# Patient Record
Sex: Female | Born: 1980
Health system: Southern US, Community
[De-identification: ages and names within clinical notes are randomized; demographics above are authoritative.]

## PROBLEM LIST (undated history)

## (undated) DIAGNOSIS — O24419 Gestational diabetes mellitus in pregnancy, unspecified control: Secondary | ICD-10-CM

## (undated) DIAGNOSIS — IMO0002 Reserved for concepts with insufficient information to code with codable children: Secondary | ICD-10-CM

## (undated) DIAGNOSIS — E282 Polycystic ovarian syndrome: Secondary | ICD-10-CM

## (undated) DIAGNOSIS — F32A Depression, unspecified: Secondary | ICD-10-CM

## (undated) DIAGNOSIS — K219 Gastro-esophageal reflux disease without esophagitis: Secondary | ICD-10-CM

## (undated) DIAGNOSIS — IMO0001 Reserved for inherently not codable concepts without codable children: Secondary | ICD-10-CM

## (undated) DIAGNOSIS — F419 Anxiety disorder, unspecified: Secondary | ICD-10-CM

## (undated) DIAGNOSIS — F329 Major depressive disorder, single episode, unspecified: Secondary | ICD-10-CM

## (undated) DIAGNOSIS — D649 Anemia, unspecified: Secondary | ICD-10-CM

## (undated) DIAGNOSIS — E119 Type 2 diabetes mellitus without complications: Secondary | ICD-10-CM

## (undated) DIAGNOSIS — E669 Obesity, unspecified: Secondary | ICD-10-CM

## (undated) HISTORY — PX: TUBAL LIGATION: SHX77

## (undated) HISTORY — DX: Gastro-esophageal reflux disease without esophagitis: K21.9

## (undated) HISTORY — DX: Anxiety disorder, unspecified: F41.9

## (undated) HISTORY — DX: Anemia, unspecified: D64.9

## (undated) HISTORY — DX: Reserved for concepts with insufficient information to code with codable children: IMO0002

## (undated) HISTORY — PX: UPPER GASTROINTESTINAL ENDOSCOPY: SHX188

## (undated) HISTORY — DX: Type 2 diabetes mellitus without complications: E11.9

## (undated) HISTORY — DX: Reserved for inherently not codable concepts without codable children: IMO0001

---

## 1999-04-01 ENCOUNTER — Emergency Department (HOSPITAL_COMMUNITY): Admission: EM | Admit: 1999-04-01 | Discharge: 1999-04-01 | Payer: Self-pay | Admitting: Emergency Medicine

## 1999-11-21 ENCOUNTER — Ambulatory Visit (HOSPITAL_COMMUNITY): Admission: RE | Admit: 1999-11-21 | Discharge: 1999-11-21 | Payer: Self-pay | Admitting: *Deleted

## 2000-01-08 ENCOUNTER — Inpatient Hospital Stay (HOSPITAL_COMMUNITY): Admission: AD | Admit: 2000-01-08 | Discharge: 2000-01-08 | Payer: Self-pay | Admitting: Obstetrics & Gynecology

## 2000-01-24 ENCOUNTER — Inpatient Hospital Stay (HOSPITAL_COMMUNITY): Admission: AD | Admit: 2000-01-24 | Discharge: 2000-01-24 | Payer: Self-pay | Admitting: Obstetrics

## 2000-01-26 ENCOUNTER — Inpatient Hospital Stay (HOSPITAL_COMMUNITY): Admission: AD | Admit: 2000-01-26 | Discharge: 2000-01-26 | Payer: Self-pay | Admitting: Obstetrics

## 2000-01-28 ENCOUNTER — Inpatient Hospital Stay (HOSPITAL_COMMUNITY): Admission: AD | Admit: 2000-01-28 | Discharge: 2000-01-28 | Payer: Self-pay | Admitting: *Deleted

## 2000-04-20 ENCOUNTER — Inpatient Hospital Stay (HOSPITAL_COMMUNITY): Admission: AD | Admit: 2000-04-20 | Discharge: 2000-04-20 | Payer: Self-pay | Admitting: Obstetrics

## 2000-04-26 ENCOUNTER — Inpatient Hospital Stay (HOSPITAL_COMMUNITY): Admission: AD | Admit: 2000-04-26 | Discharge: 2000-04-26 | Payer: Self-pay | Admitting: Obstetrics

## 2000-05-04 ENCOUNTER — Inpatient Hospital Stay (HOSPITAL_COMMUNITY): Admission: AD | Admit: 2000-05-04 | Discharge: 2000-05-04 | Payer: Self-pay | Admitting: Obstetrics

## 2000-05-06 ENCOUNTER — Inpatient Hospital Stay (HOSPITAL_COMMUNITY): Admission: AD | Admit: 2000-05-06 | Discharge: 2000-05-09 | Payer: Self-pay | Admitting: *Deleted

## 2000-11-29 ENCOUNTER — Encounter: Admission: RE | Admit: 2000-11-29 | Discharge: 2000-11-29 | Payer: Self-pay | Admitting: Sports Medicine

## 2000-12-06 ENCOUNTER — Encounter: Admission: RE | Admit: 2000-12-06 | Discharge: 2000-12-06 | Payer: Self-pay | Admitting: Family Medicine

## 2001-02-17 ENCOUNTER — Emergency Department (HOSPITAL_COMMUNITY): Admission: EM | Admit: 2001-02-17 | Discharge: 2001-02-17 | Payer: Self-pay | Admitting: Emergency Medicine

## 2001-03-04 ENCOUNTER — Encounter: Admission: RE | Admit: 2001-03-04 | Discharge: 2001-03-04 | Payer: Self-pay | Admitting: Family Medicine

## 2001-04-28 ENCOUNTER — Emergency Department (HOSPITAL_COMMUNITY): Admission: EM | Admit: 2001-04-28 | Discharge: 2001-04-29 | Payer: Self-pay | Admitting: Emergency Medicine

## 2001-05-26 ENCOUNTER — Encounter: Admission: RE | Admit: 2001-05-26 | Discharge: 2001-05-26 | Payer: Self-pay | Admitting: Family Medicine

## 2001-08-04 ENCOUNTER — Encounter: Payer: Self-pay | Admitting: Emergency Medicine

## 2001-08-04 ENCOUNTER — Emergency Department (HOSPITAL_COMMUNITY): Admission: EM | Admit: 2001-08-04 | Discharge: 2001-08-04 | Payer: Self-pay | Admitting: Emergency Medicine

## 2001-08-25 ENCOUNTER — Encounter: Admission: RE | Admit: 2001-08-25 | Discharge: 2001-08-25 | Payer: Self-pay | Admitting: Family Medicine

## 2001-08-29 ENCOUNTER — Encounter: Admission: RE | Admit: 2001-08-29 | Discharge: 2001-08-29 | Payer: Self-pay | Admitting: Family Medicine

## 2001-10-04 ENCOUNTER — Encounter: Admission: RE | Admit: 2001-10-04 | Discharge: 2001-10-04 | Payer: Self-pay | Admitting: Family Medicine

## 2001-11-23 ENCOUNTER — Encounter: Admission: RE | Admit: 2001-11-23 | Discharge: 2001-11-23 | Payer: Self-pay | Admitting: Family Medicine

## 2001-12-07 ENCOUNTER — Encounter: Admission: RE | Admit: 2001-12-07 | Discharge: 2001-12-07 | Payer: Self-pay | Admitting: Family Medicine

## 2002-02-24 ENCOUNTER — Encounter: Admission: RE | Admit: 2002-02-24 | Discharge: 2002-02-24 | Payer: Self-pay | Admitting: Family Medicine

## 2002-03-23 ENCOUNTER — Encounter: Admission: RE | Admit: 2002-03-23 | Discharge: 2002-03-23 | Payer: Self-pay | Admitting: Family Medicine

## 2002-04-05 ENCOUNTER — Encounter: Admission: RE | Admit: 2002-04-05 | Discharge: 2002-04-05 | Payer: Self-pay | Admitting: Family Medicine

## 2002-04-26 ENCOUNTER — Encounter: Admission: RE | Admit: 2002-04-26 | Discharge: 2002-04-26 | Payer: Self-pay | Admitting: Sports Medicine

## 2002-04-27 ENCOUNTER — Encounter: Admission: RE | Admit: 2002-04-27 | Discharge: 2002-04-27 | Payer: Self-pay | Admitting: Family Medicine

## 2002-05-03 ENCOUNTER — Encounter: Admission: RE | Admit: 2002-05-03 | Discharge: 2002-05-03 | Payer: Self-pay | Admitting: Family Medicine

## 2002-08-02 ENCOUNTER — Encounter: Admission: RE | Admit: 2002-08-02 | Discharge: 2002-08-02 | Payer: Self-pay | Admitting: Family Medicine

## 2002-09-22 ENCOUNTER — Emergency Department (HOSPITAL_COMMUNITY): Admission: EM | Admit: 2002-09-22 | Discharge: 2002-09-22 | Payer: Self-pay | Admitting: Emergency Medicine

## 2002-10-26 ENCOUNTER — Encounter: Admission: RE | Admit: 2002-10-26 | Discharge: 2002-10-26 | Payer: Self-pay | Admitting: Sports Medicine

## 2003-01-09 ENCOUNTER — Encounter: Admission: RE | Admit: 2003-01-09 | Discharge: 2003-01-09 | Payer: Self-pay | Admitting: Sports Medicine

## 2003-06-19 ENCOUNTER — Encounter: Admission: RE | Admit: 2003-06-19 | Discharge: 2003-06-19 | Payer: Self-pay | Admitting: Family Medicine

## 2003-07-18 ENCOUNTER — Emergency Department (HOSPITAL_COMMUNITY): Admission: EM | Admit: 2003-07-18 | Discharge: 2003-07-19 | Payer: Self-pay | Admitting: Emergency Medicine

## 2003-07-19 ENCOUNTER — Encounter: Payer: Self-pay | Admitting: Emergency Medicine

## 2003-07-19 ENCOUNTER — Encounter: Payer: Self-pay | Admitting: *Deleted

## 2003-11-01 ENCOUNTER — Encounter: Admission: RE | Admit: 2003-11-01 | Discharge: 2003-11-01 | Payer: Self-pay | Admitting: Family Medicine

## 2003-11-12 ENCOUNTER — Encounter: Admission: RE | Admit: 2003-11-12 | Discharge: 2003-11-12 | Payer: Self-pay | Admitting: Family Medicine

## 2003-12-31 ENCOUNTER — Encounter: Admission: RE | Admit: 2003-12-31 | Discharge: 2003-12-31 | Payer: Self-pay | Admitting: Family Medicine

## 2004-02-14 ENCOUNTER — Encounter: Admission: RE | Admit: 2004-02-14 | Discharge: 2004-02-14 | Payer: Self-pay | Admitting: Family Medicine

## 2004-03-13 ENCOUNTER — Encounter: Admission: RE | Admit: 2004-03-13 | Discharge: 2004-03-13 | Payer: Self-pay | Admitting: Family Medicine

## 2004-04-02 ENCOUNTER — Encounter: Admission: RE | Admit: 2004-04-02 | Discharge: 2004-04-02 | Payer: Self-pay | Admitting: Family Medicine

## 2004-04-16 ENCOUNTER — Emergency Department (HOSPITAL_COMMUNITY): Admission: EM | Admit: 2004-04-16 | Discharge: 2004-04-16 | Payer: Self-pay | Admitting: Emergency Medicine

## 2004-05-01 ENCOUNTER — Encounter: Admission: RE | Admit: 2004-05-01 | Discharge: 2004-05-01 | Payer: Self-pay | Admitting: Family Medicine

## 2004-05-19 ENCOUNTER — Encounter (INDEPENDENT_AMBULATORY_CARE_PROVIDER_SITE_OTHER): Payer: Self-pay | Admitting: *Deleted

## 2004-05-19 LAB — CONVERTED CEMR LAB

## 2004-05-23 ENCOUNTER — Encounter: Admission: RE | Admit: 2004-05-23 | Discharge: 2004-05-23 | Payer: Self-pay | Admitting: Family Medicine

## 2004-07-22 ENCOUNTER — Ambulatory Visit: Payer: Self-pay | Admitting: Family Medicine

## 2004-07-25 ENCOUNTER — Ambulatory Visit: Payer: Self-pay | Admitting: Sports Medicine

## 2004-08-08 ENCOUNTER — Ambulatory Visit: Payer: Self-pay | Admitting: Family Medicine

## 2004-11-07 ENCOUNTER — Ambulatory Visit: Payer: Self-pay | Admitting: Family Medicine

## 2004-11-21 ENCOUNTER — Ambulatory Visit: Payer: Self-pay | Admitting: Family Medicine

## 2005-03-09 ENCOUNTER — Ambulatory Visit: Payer: Self-pay | Admitting: Family Medicine

## 2005-04-17 ENCOUNTER — Ambulatory Visit: Payer: Self-pay | Admitting: Sports Medicine

## 2005-06-01 ENCOUNTER — Ambulatory Visit: Payer: Self-pay | Admitting: Family Medicine

## 2005-06-14 ENCOUNTER — Emergency Department (HOSPITAL_COMMUNITY): Admission: EM | Admit: 2005-06-14 | Discharge: 2005-06-14 | Payer: Self-pay | Admitting: Family Medicine

## 2005-06-18 ENCOUNTER — Ambulatory Visit: Payer: Self-pay | Admitting: Family Medicine

## 2005-08-10 ENCOUNTER — Ambulatory Visit: Payer: Self-pay | Admitting: Family Medicine

## 2005-12-05 ENCOUNTER — Emergency Department (HOSPITAL_COMMUNITY): Admission: EM | Admit: 2005-12-05 | Discharge: 2005-12-05 | Payer: Self-pay | Admitting: Family Medicine

## 2006-01-14 ENCOUNTER — Ambulatory Visit: Payer: Self-pay | Admitting: Family Medicine

## 2006-10-09 ENCOUNTER — Emergency Department (HOSPITAL_COMMUNITY): Admission: EM | Admit: 2006-10-09 | Discharge: 2006-10-09 | Payer: Self-pay | Admitting: Emergency Medicine

## 2006-10-30 ENCOUNTER — Emergency Department (HOSPITAL_COMMUNITY): Admission: EM | Admit: 2006-10-30 | Discharge: 2006-10-30 | Payer: Self-pay | Admitting: Emergency Medicine

## 2006-12-03 ENCOUNTER — Emergency Department (HOSPITAL_COMMUNITY): Admission: EM | Admit: 2006-12-03 | Discharge: 2006-12-03 | Payer: Self-pay | Admitting: Emergency Medicine

## 2006-12-13 ENCOUNTER — Inpatient Hospital Stay: Admission: AD | Admit: 2006-12-13 | Discharge: 2006-12-13 | Payer: Self-pay | Admitting: Gynecology

## 2006-12-16 DIAGNOSIS — F339 Major depressive disorder, recurrent, unspecified: Secondary | ICD-10-CM | POA: Insufficient documentation

## 2006-12-16 DIAGNOSIS — F17200 Nicotine dependence, unspecified, uncomplicated: Secondary | ICD-10-CM | POA: Insufficient documentation

## 2006-12-16 DIAGNOSIS — E282 Polycystic ovarian syndrome: Secondary | ICD-10-CM

## 2006-12-16 DIAGNOSIS — E669 Obesity, unspecified: Secondary | ICD-10-CM | POA: Insufficient documentation

## 2006-12-17 ENCOUNTER — Encounter (INDEPENDENT_AMBULATORY_CARE_PROVIDER_SITE_OTHER): Payer: Self-pay | Admitting: *Deleted

## 2007-01-24 ENCOUNTER — Ambulatory Visit (HOSPITAL_COMMUNITY): Admission: RE | Admit: 2007-01-24 | Discharge: 2007-01-24 | Payer: Self-pay | Admitting: Obstetrics & Gynecology

## 2007-02-12 ENCOUNTER — Inpatient Hospital Stay (HOSPITAL_COMMUNITY): Admission: AD | Admit: 2007-02-12 | Discharge: 2007-02-12 | Payer: Self-pay | Admitting: Family Medicine

## 2007-03-20 ENCOUNTER — Emergency Department (HOSPITAL_COMMUNITY): Admission: EM | Admit: 2007-03-20 | Discharge: 2007-03-21 | Payer: Self-pay | Admitting: Emergency Medicine

## 2007-04-09 ENCOUNTER — Inpatient Hospital Stay (HOSPITAL_COMMUNITY): Admission: AD | Admit: 2007-04-09 | Discharge: 2007-04-09 | Payer: Self-pay | Admitting: Obstetrics & Gynecology

## 2007-05-04 ENCOUNTER — Ambulatory Visit (HOSPITAL_COMMUNITY): Admission: RE | Admit: 2007-05-04 | Discharge: 2007-05-04 | Payer: Self-pay | Admitting: Obstetrics & Gynecology

## 2007-05-08 ENCOUNTER — Ambulatory Visit: Payer: Self-pay | Admitting: Obstetrics & Gynecology

## 2007-05-08 ENCOUNTER — Observation Stay (HOSPITAL_COMMUNITY): Admission: AD | Admit: 2007-05-08 | Discharge: 2007-05-09 | Payer: Self-pay | Admitting: Obstetrics & Gynecology

## 2007-06-16 ENCOUNTER — Inpatient Hospital Stay (HOSPITAL_COMMUNITY): Admission: RE | Admit: 2007-06-16 | Discharge: 2007-06-18 | Payer: Self-pay | Admitting: Obstetrics and Gynecology

## 2007-06-16 ENCOUNTER — Ambulatory Visit: Payer: Self-pay | Admitting: Obstetrics and Gynecology

## 2007-06-16 ENCOUNTER — Encounter: Payer: Self-pay | Admitting: Obstetrics and Gynecology

## 2007-07-11 ENCOUNTER — Emergency Department (HOSPITAL_COMMUNITY): Admission: EM | Admit: 2007-07-11 | Discharge: 2007-07-11 | Payer: Self-pay | Admitting: Family Medicine

## 2007-11-01 ENCOUNTER — Emergency Department (HOSPITAL_COMMUNITY): Admission: EM | Admit: 2007-11-01 | Discharge: 2007-11-01 | Payer: Self-pay | Admitting: Family Medicine

## 2008-01-14 ENCOUNTER — Emergency Department (HOSPITAL_COMMUNITY): Admission: EM | Admit: 2008-01-14 | Discharge: 2008-01-14 | Payer: Self-pay | Admitting: Emergency Medicine

## 2008-07-27 ENCOUNTER — Emergency Department (HOSPITAL_COMMUNITY): Admission: EM | Admit: 2008-07-27 | Discharge: 2008-07-27 | Payer: Self-pay | Admitting: Emergency Medicine

## 2008-10-05 ENCOUNTER — Emergency Department (HOSPITAL_COMMUNITY): Admission: EM | Admit: 2008-10-05 | Discharge: 2008-10-05 | Payer: Self-pay | Admitting: Emergency Medicine

## 2008-11-07 ENCOUNTER — Emergency Department (HOSPITAL_COMMUNITY): Admission: EM | Admit: 2008-11-07 | Discharge: 2008-11-07 | Payer: Self-pay | Admitting: Emergency Medicine

## 2008-12-03 ENCOUNTER — Emergency Department (HOSPITAL_COMMUNITY): Admission: EM | Admit: 2008-12-03 | Discharge: 2008-12-04 | Payer: Self-pay | Admitting: Emergency Medicine

## 2008-12-23 ENCOUNTER — Emergency Department (HOSPITAL_COMMUNITY): Admission: EM | Admit: 2008-12-23 | Discharge: 2008-12-23 | Payer: Self-pay | Admitting: Emergency Medicine

## 2008-12-24 ENCOUNTER — Emergency Department (HOSPITAL_COMMUNITY): Admission: EM | Admit: 2008-12-24 | Discharge: 2008-12-25 | Payer: Self-pay | Admitting: Emergency Medicine

## 2009-02-28 ENCOUNTER — Emergency Department (HOSPITAL_COMMUNITY): Admission: EM | Admit: 2009-02-28 | Discharge: 2009-02-28 | Payer: Self-pay | Admitting: Family Medicine

## 2009-04-01 ENCOUNTER — Emergency Department (HOSPITAL_COMMUNITY): Admission: EM | Admit: 2009-04-01 | Discharge: 2009-04-01 | Payer: Self-pay | Admitting: Family Medicine

## 2009-04-03 ENCOUNTER — Emergency Department (HOSPITAL_COMMUNITY): Admission: EM | Admit: 2009-04-03 | Discharge: 2009-04-03 | Payer: Self-pay | Admitting: Emergency Medicine

## 2009-04-26 ENCOUNTER — Inpatient Hospital Stay (HOSPITAL_COMMUNITY): Admission: AD | Admit: 2009-04-26 | Discharge: 2009-04-27 | Payer: Self-pay | Admitting: Obstetrics & Gynecology

## 2009-06-20 IMAGING — CT CT PELVIS W/ CM
2 of 5 series · 17 of 46 positions shown, 19 images · IV contrast (agent unspecified)
Comparison: None available

CT ABDOMEN

CLINICAL DATA: Left lower quadrant pain.  Abdominal pain.
Periumbilical pain.

CT ABDOMEN AND PELVIS WITH CONTRAST
TECHNIQUE: Multidetector CT imaging of the abdomen and pelvis was
performed using the standard protocol following bolus
administration of intravenous contrast.
Contrast: 100 ml Zmnipaque-SDD.

[Series 2: abd_pel 5.0 b40f st · axial · 0.71mm/px · z∈[-380,+20]mm · 14 of 90 slices shown, 16 images]
[im 5/90  soft-tissue]
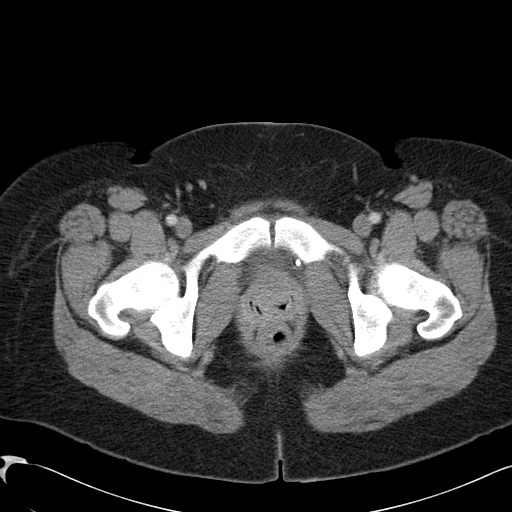
[im 5/90  bone]
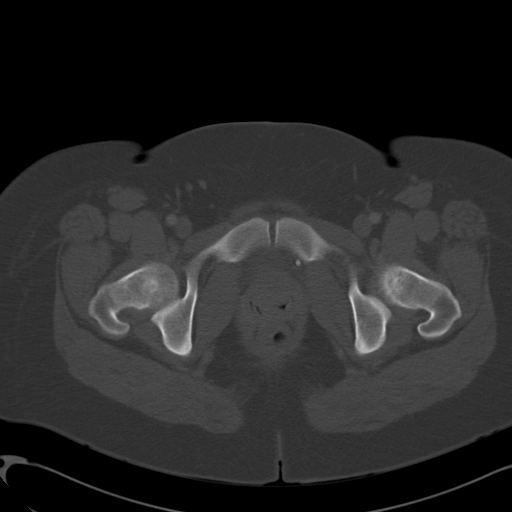
[im 10/90  soft-tissue]
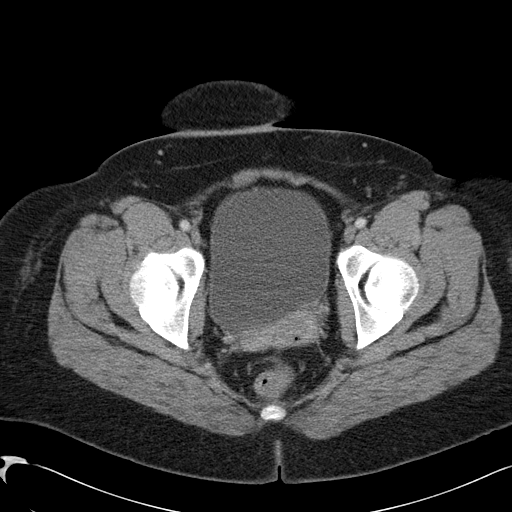
[im 19/90  soft-tissue]
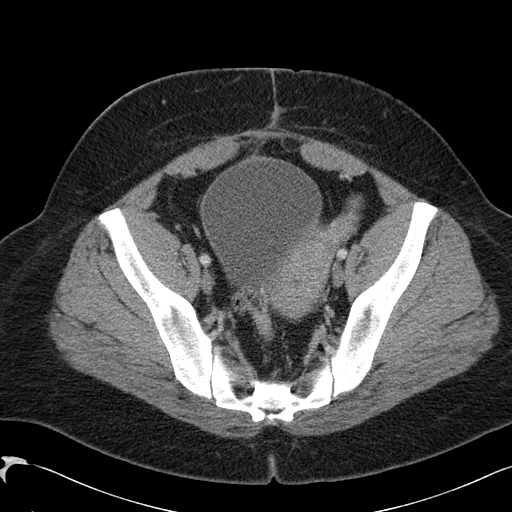
[im 24/90  soft-tissue]
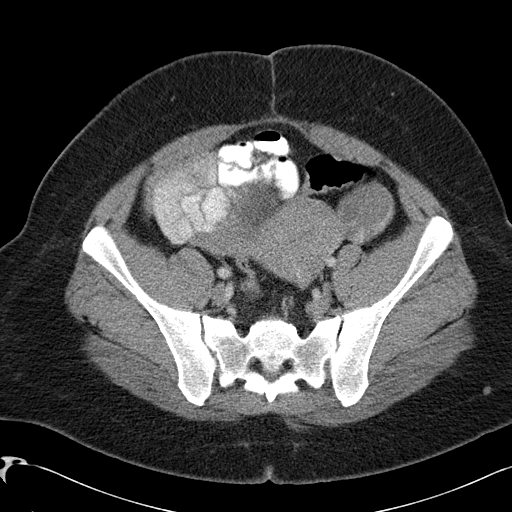
[im 29/90  soft-tissue]
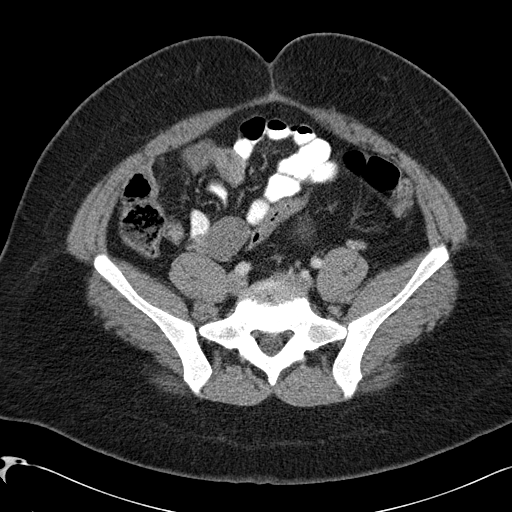
[im 38/90  soft-tissue]
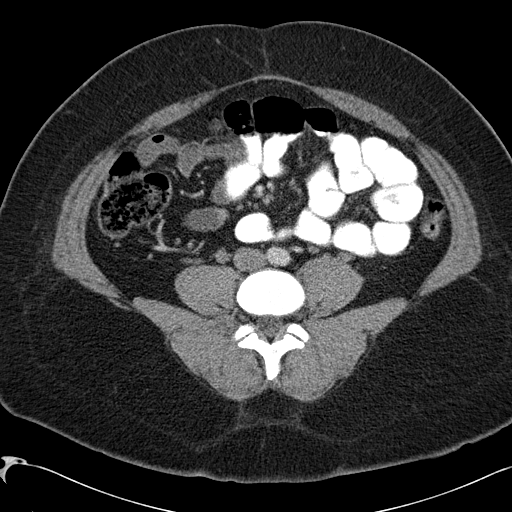
[im 43/90  soft-tissue]
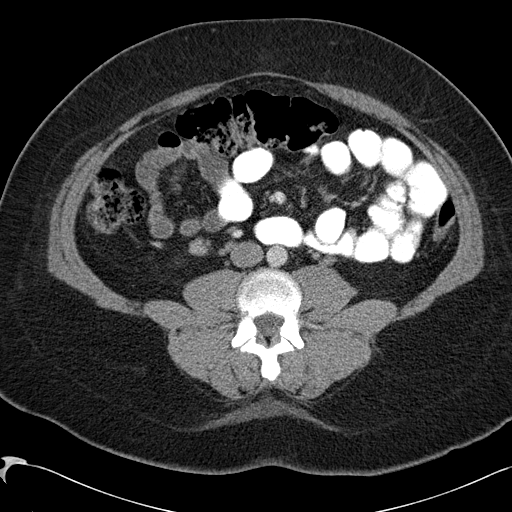
[im 47/90  soft-tissue]
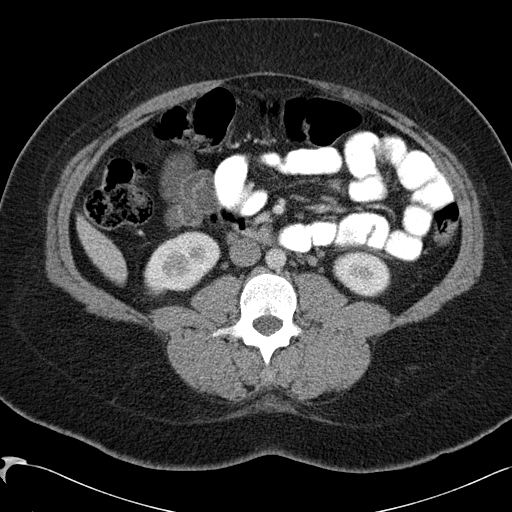
[im 52/90  soft-tissue]
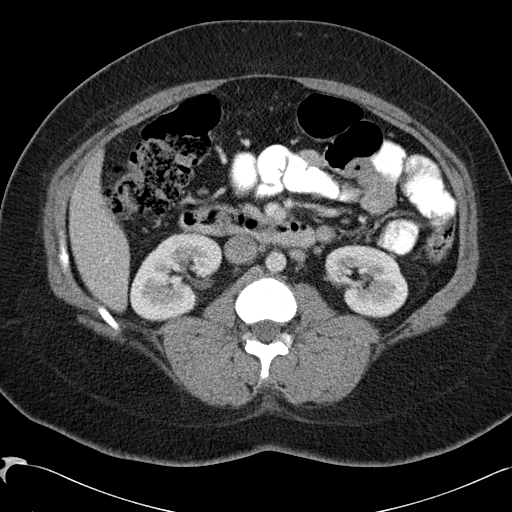
[im 52/90  bone]
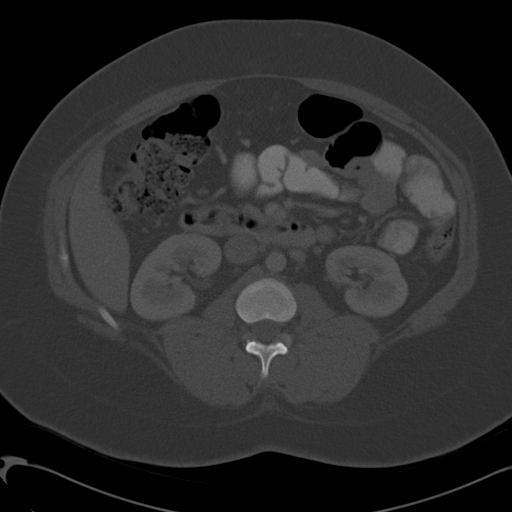
[im 61/90  soft-tissue]
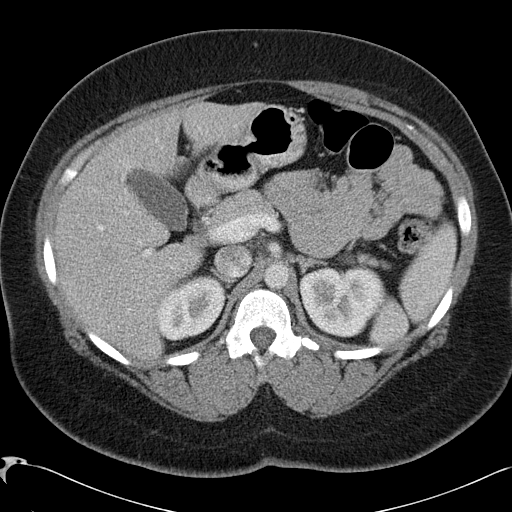
[im 66/90  soft-tissue]
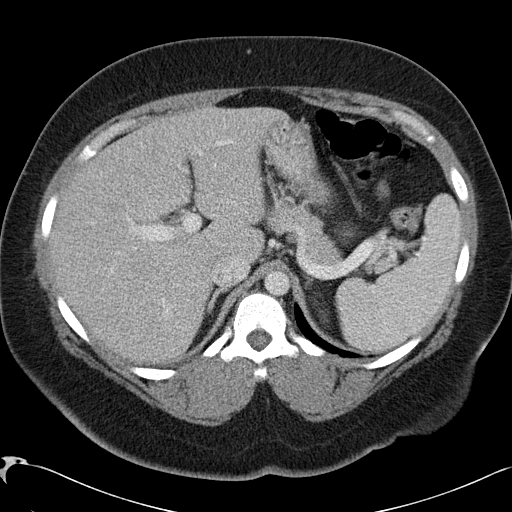
[im 71/90  soft-tissue]
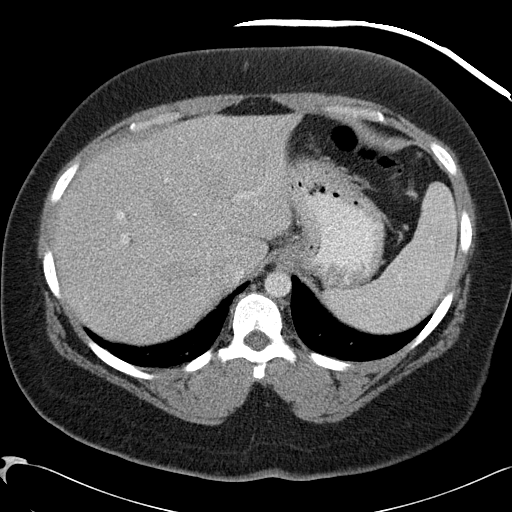
[im 80/90  soft-tissue]
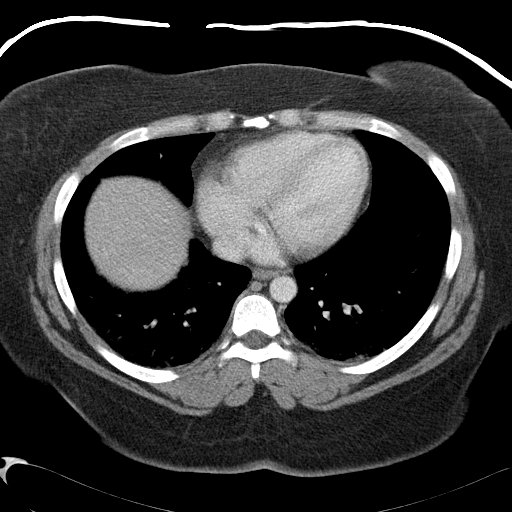
[im 85/90  soft-tissue]
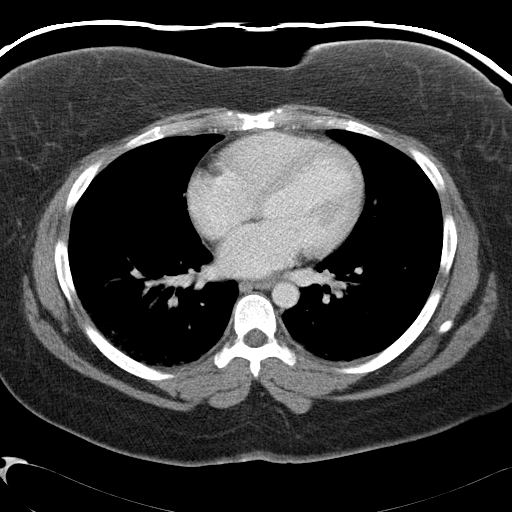

[Series 602: <mpr thick range> · coronal · 0.91mm/px · 3 of 82 slices shown]
[im 28/82  soft-tissue]
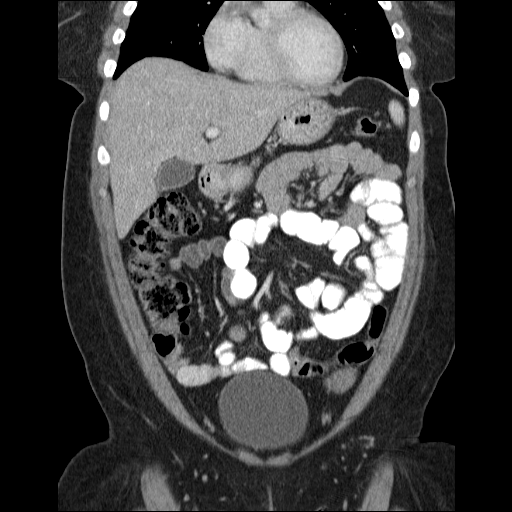
[im 37/82  soft-tissue]
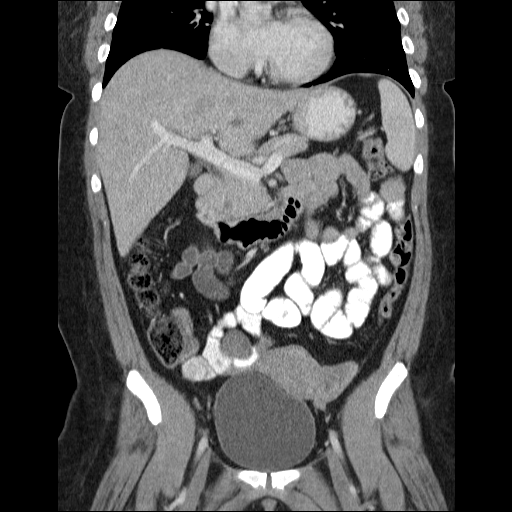
[im 46/82  soft-tissue]
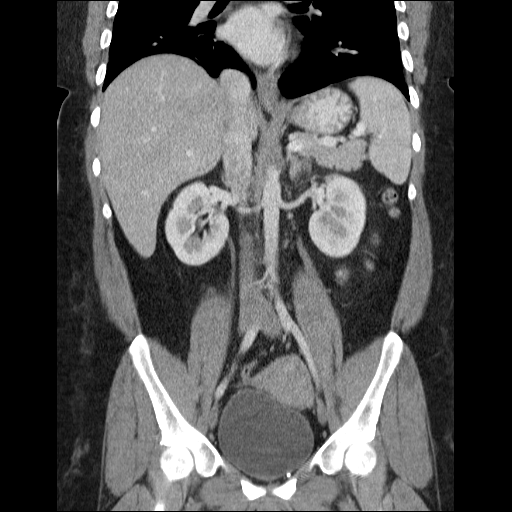

[17 of 46 positions shown; findings below may reference images not displayed]

FINDINGS: Dependent atelectasis is present in the lungs.  Liver
and gallbladder appear within normal limits.  Kidneys show normal
enhancement and excretion of contrast bilaterally.  Spleen
unremarkable.  Stomach, pancreas and proximal small bowel appear
within normal limits.  No free air or free fluid.
IMPRESSION: No acute abdominal abnormality.

CT PELVIS
FINDINGS: No free fluid is present in the anatomic pelvis.
Urinary bladder within normal limits.  Intermediate attenuation
left ovarian cystic lesion measuring 3.7 x 3.2 cm.  This probably
represent hemorrhagic ovarian cyst.  Endometrioma not excluded.
Smaller 3.3 x 2.6 cm right ovarian cystic lesion.  Midline
abdominal scar is present.  Colon appears within normal limits.
Normal appendix identified.  Bones appear within normal limits.
IMPRESSION: No acute pelvic abnormality.  Bilateral ovarian cystic lesions.
Follow-up 6-week transvaginal ultrasound recommended to assess for
interval change or resolution.

## 2009-06-28 ENCOUNTER — Emergency Department (HOSPITAL_COMMUNITY): Admission: EM | Admit: 2009-06-28 | Discharge: 2009-06-28 | Payer: Self-pay | Admitting: Emergency Medicine

## 2009-09-30 ENCOUNTER — Emergency Department (HOSPITAL_COMMUNITY): Admission: EM | Admit: 2009-09-30 | Discharge: 2009-09-30 | Payer: Self-pay | Admitting: Emergency Medicine

## 2009-10-15 ENCOUNTER — Emergency Department (HOSPITAL_COMMUNITY): Admission: EM | Admit: 2009-10-15 | Discharge: 2009-10-15 | Payer: Self-pay | Admitting: Emergency Medicine

## 2009-11-04 ENCOUNTER — Inpatient Hospital Stay (HOSPITAL_COMMUNITY): Admission: AD | Admit: 2009-11-04 | Discharge: 2009-11-05 | Payer: Self-pay | Admitting: Obstetrics & Gynecology

## 2009-12-10 ENCOUNTER — Emergency Department (HOSPITAL_COMMUNITY): Admission: EM | Admit: 2009-12-10 | Discharge: 2009-12-11 | Payer: Self-pay | Admitting: Emergency Medicine

## 2010-03-18 ENCOUNTER — Emergency Department (HOSPITAL_COMMUNITY): Admission: EM | Admit: 2010-03-18 | Discharge: 2010-03-18 | Payer: Self-pay | Admitting: Family Medicine

## 2010-03-20 ENCOUNTER — Emergency Department (HOSPITAL_COMMUNITY): Admission: EM | Admit: 2010-03-20 | Discharge: 2010-03-20 | Payer: Self-pay | Admitting: Family Medicine

## 2010-05-14 ENCOUNTER — Emergency Department (HOSPITAL_COMMUNITY): Admission: EM | Admit: 2010-05-14 | Discharge: 2010-05-14 | Payer: Self-pay | Admitting: Emergency Medicine

## 2010-07-13 ENCOUNTER — Emergency Department (HOSPITAL_COMMUNITY): Admission: EM | Admit: 2010-07-13 | Discharge: 2010-07-13 | Payer: Self-pay | Admitting: Emergency Medicine

## 2010-10-07 ENCOUNTER — Emergency Department (HOSPITAL_COMMUNITY)
Admission: EM | Admit: 2010-10-07 | Discharge: 2010-10-07 | Payer: Self-pay | Source: Home / Self Care | Admitting: Family Medicine

## 2010-10-12 ENCOUNTER — Inpatient Hospital Stay (HOSPITAL_COMMUNITY)
Admission: AD | Admit: 2010-10-12 | Discharge: 2010-10-13 | Payer: Self-pay | Source: Home / Self Care | Attending: Obstetrics and Gynecology | Admitting: Obstetrics and Gynecology

## 2010-10-19 NOTE — L&D Delivery Note (Signed)
Cesarean Section Procedure Note   [name]   06/01/2011  Indications: Scheduled Proceedure/Maternal Request   Pre-operative Diagnosis: Previous Cesarean Section; Desires Sterilization.   Post-operative Diagnosis: Same   Surgeon: Coral Ceo A  Assistants: Francoise Ceo  Anesthesia: spinal  Procedure Details:  The patient was seen in the Holding Room. The risks, benefits, complications, treatment options, and expected outcomes were discussed with the patient. The patient concurred with the proposed plan, giving informed consent. The patient was identified as Stacie Carrillo and the procedure verified as C-Section Delivery. A Time Out was held and the above information confirmed.  After induction of anesthesia, the patient was draped and prepped in the usual sterile manner. A transverse incision was made and carried down through the subcutaneous tissue to the fascia. The fascial incision was made and extended transversely. The fascia was separated from the underlying rectus tissue superiorly and inferiorly. The peritoneum was identified and entered. The peritoneal incision was extended longitudinally. The utero-vesical peritoneal reflection was incised transversely and the bladder flap was bluntly freed from the lower uterine segment. A low transverse uterine incision was made. Delivered from cephalic presentation was a 3265 gram living newborn maleinfant(s) with Apgar scores of 8 at one minute and 9 at five minutes. A cord ph was not sent. The umbilical cord was clamped and cut cord. A sample was obtained for evaluation. The placenta was removed Intact and appeared normal.  The uterine incision was closed with running locked sutures of 1-0 Monocryl. A second imbricating layer of the same suture was placed.  Hemostasis was observed. The paracolic gutters were irrigated. The fascia was then reapproximated with running sutures of 1-0Vicryl. The subcuticular closure was performed using  3-0 Monocryl.  Instrument, sponge, and needle counts were correct prior the abdominal closure and were correct at the conclusion of the case.    Findings:  Normal uterus, ovaries and tubes.   Estimated Blood Loss:  Total IV Fluids:   Urine Output: 125CC OF clear urine  Specimens:  Placenta, portion of left and right fallopian tubes. Specimens           Complications: no complications  Disposition: PACU - hemodynamically stable.  Maternal Condition: stable   Baby condition / location:  nursery-stable    Signed: Surgeon(s): Brock Bad, MD Kathreen Cosier, MD

## 2010-10-26 ENCOUNTER — Inpatient Hospital Stay (HOSPITAL_COMMUNITY)
Admission: AD | Admit: 2010-10-26 | Discharge: 2010-10-26 | Payer: Self-pay | Source: Home / Self Care | Attending: Obstetrics and Gynecology | Admitting: Obstetrics and Gynecology

## 2010-11-03 ENCOUNTER — Inpatient Hospital Stay (HOSPITAL_COMMUNITY)
Admission: AD | Admit: 2010-11-03 | Discharge: 2010-11-03 | Payer: Self-pay | Source: Home / Self Care | Attending: Obstetrics and Gynecology | Admitting: Obstetrics and Gynecology

## 2010-11-03 LAB — GC/CHLAMYDIA PROBE AMP, GENITAL
Chlamydia, DNA Probe: NEGATIVE
GC Probe Amp, Genital: NEGATIVE

## 2010-11-03 LAB — URINALYSIS, ROUTINE W REFLEX MICROSCOPIC
Bilirubin Urine: NEGATIVE
Hgb urine dipstick: NEGATIVE
Ketones, ur: NEGATIVE mg/dL
Nitrite: NEGATIVE
Protein, ur: NEGATIVE mg/dL
Specific Gravity, Urine: 1.02 (ref 1.005–1.030)
Urine Glucose, Fasting: NEGATIVE mg/dL
Urobilinogen, UA: 0.2 mg/dL (ref 0.0–1.0)
pH: 8 (ref 5.0–8.0)

## 2010-11-03 LAB — CBC
HCT: 37.8 % (ref 36.0–46.0)
Hemoglobin: 13.8 g/dL (ref 12.0–15.0)
MCH: 31.8 pg (ref 26.0–34.0)
MCHC: 36.5 g/dL — ABNORMAL HIGH (ref 30.0–36.0)
MCV: 87.1 fL (ref 78.0–100.0)
Platelets: 224 10*3/uL (ref 150–400)
RBC: 4.34 MIL/uL (ref 3.87–5.11)
RDW: 12.8 % (ref 11.5–15.5)
WBC: 10.8 10*3/uL — ABNORMAL HIGH (ref 4.0–10.5)

## 2010-11-03 LAB — WET PREP, GENITAL
Trich, Wet Prep: NONE SEEN
Yeast Wet Prep HPF POC: NONE SEEN

## 2010-11-03 LAB — HCG, QUANTITATIVE, PREGNANCY: hCG, Beta Chain, Quant, S: 94110 m[IU]/mL — ABNORMAL HIGH (ref <5)

## 2010-11-03 LAB — ABO/RH: ABO/RH(D): O POS

## 2010-11-05 LAB — URINALYSIS, ROUTINE W REFLEX MICROSCOPIC
Bilirubin Urine: NEGATIVE
Hgb urine dipstick: NEGATIVE
Ketones, ur: 15 mg/dL — AB
Nitrite: NEGATIVE
Protein, ur: NEGATIVE mg/dL
Specific Gravity, Urine: >1.03 — ABNORMAL HIGH (ref 1.005–1.030)
Urine Glucose, Fasting: NEGATIVE mg/dL
Urobilinogen, UA: 0.2 mg/dL (ref 0.0–1.0)
pH: 6 (ref 5.0–8.0)

## 2010-12-09 ENCOUNTER — Inpatient Hospital Stay (HOSPITAL_COMMUNITY)
Admission: AD | Admit: 2010-12-09 | Discharge: 2010-12-10 | Disposition: A | Discharge status: Home / Self Care | Payer: Medicaid Other | Source: Ambulatory Visit | Attending: Obstetrics & Gynecology | Admitting: Obstetrics & Gynecology

## 2010-12-09 DIAGNOSIS — O2390 Unspecified genitourinary tract infection in pregnancy, unspecified trimester: Secondary | ICD-10-CM | POA: Insufficient documentation

## 2010-12-09 DIAGNOSIS — R109 Unspecified abdominal pain: Secondary | ICD-10-CM | POA: Insufficient documentation

## 2010-12-09 DIAGNOSIS — N760 Acute vaginitis: Secondary | ICD-10-CM

## 2010-12-09 DIAGNOSIS — B9689 Other specified bacterial agents as the cause of diseases classified elsewhere: Secondary | ICD-10-CM | POA: Insufficient documentation

## 2010-12-09 DIAGNOSIS — A499 Bacterial infection, unspecified: Secondary | ICD-10-CM | POA: Insufficient documentation

## 2010-12-10 LAB — WET PREP, GENITAL: Yeast Wet Prep HPF POC: NONE SEEN

## 2010-12-10 LAB — URINALYSIS, ROUTINE W REFLEX MICROSCOPIC
Nitrite: NEGATIVE
Protein, ur: NEGATIVE mg/dL
Urine Glucose, Fasting: NEGATIVE mg/dL
pH: 6 (ref 5.0–8.0)

## 2010-12-29 LAB — URINALYSIS, ROUTINE W REFLEX MICROSCOPIC
Bilirubin Urine: NEGATIVE
Glucose, UA: NEGATIVE mg/dL
Hgb urine dipstick: NEGATIVE
Specific Gravity, Urine: >1.03 — ABNORMAL HIGH (ref 1.005–1.030)
Urobilinogen, UA: 1 mg/dL (ref 0.0–1.0)

## 2011-01-05 LAB — URINALYSIS, ROUTINE W REFLEX MICROSCOPIC
Glucose, UA: NEGATIVE mg/dL
Hgb urine dipstick: NEGATIVE
Protein, ur: NEGATIVE mg/dL
pH: 7 (ref 5.0–8.0)

## 2011-01-05 LAB — CULTURE, ROUTINE-ABSCESS

## 2011-01-05 LAB — POCT PREGNANCY, URINE: Preg Test, Ur: NEGATIVE

## 2011-01-21 ENCOUNTER — Inpatient Hospital Stay (HOSPITAL_COMMUNITY)
Admission: AD | Admit: 2011-01-21 | Discharge: 2011-01-21 | Disposition: A | Discharge status: Home / Self Care | Payer: Medicaid Other | Source: Ambulatory Visit | Attending: Obstetrics & Gynecology | Admitting: Obstetrics & Gynecology

## 2011-01-21 DIAGNOSIS — O99891 Other specified diseases and conditions complicating pregnancy: Secondary | ICD-10-CM | POA: Insufficient documentation

## 2011-01-21 DIAGNOSIS — N949 Unspecified condition associated with female genital organs and menstrual cycle: Secondary | ICD-10-CM

## 2011-01-21 LAB — URINALYSIS, ROUTINE W REFLEX MICROSCOPIC
Bilirubin Urine: NEGATIVE
Glucose, UA: NEGATIVE mg/dL
Hgb urine dipstick: NEGATIVE
Ketones, ur: 15 mg/dL — AB
Protein, ur: NEGATIVE mg/dL
pH: 6.5 (ref 5.0–8.0)

## 2011-01-26 LAB — WET PREP, GENITAL: Yeast Wet Prep HPF POC: NONE SEEN

## 2011-01-26 LAB — URINALYSIS, ROUTINE W REFLEX MICROSCOPIC
Bilirubin Urine: NEGATIVE
Glucose, UA: NEGATIVE mg/dL
Hgb urine dipstick: NEGATIVE
Ketones, ur: 15 mg/dL — AB
Nitrite: NEGATIVE
Specific Gravity, Urine: >1.03 — ABNORMAL HIGH (ref 1.005–1.030)
pH: 6 (ref 5.0–8.0)

## 2011-01-26 LAB — COMPREHENSIVE METABOLIC PANEL
Albumin: 3.8 g/dL (ref 3.5–5.2)
BUN: 9 mg/dL (ref 6–23)
Calcium: 9.3 mg/dL (ref 8.4–10.5)
Glucose, Bld: 93 mg/dL (ref 70–99)
Potassium: 4.3 mEq/L (ref 3.5–5.1)
Sodium: 138 mEq/L (ref 135–145)
Total Protein: 7.3 g/dL (ref 6.0–8.3)

## 2011-01-26 LAB — CBC
Hemoglobin: 15.1 g/dL — ABNORMAL HIGH (ref 12.0–15.0)
MCHC: 34.6 g/dL (ref 30.0–36.0)
Platelets: 183 10*3/uL (ref 150–400)
RDW: 13.7 % (ref 11.5–15.5)

## 2011-01-26 LAB — WOUND CULTURE

## 2011-01-26 LAB — GC/CHLAMYDIA PROBE AMP, GENITAL
Chlamydia, DNA Probe: NEGATIVE
GC Probe Amp, Genital: NEGATIVE

## 2011-01-29 LAB — URINALYSIS, ROUTINE W REFLEX MICROSCOPIC
Bilirubin Urine: NEGATIVE
Glucose, UA: NEGATIVE mg/dL
Ketones, ur: 15 mg/dL — AB
Ketones, ur: NEGATIVE mg/dL
Leukocytes, UA: NEGATIVE
Nitrite: NEGATIVE
Protein, ur: NEGATIVE mg/dL
Urobilinogen, UA: 1 mg/dL (ref 0.0–1.0)
pH: 6.5 (ref 5.0–8.0)

## 2011-01-29 LAB — DIFFERENTIAL
Eosinophils Relative: 1 % (ref 0–5)
Lymphocytes Relative: 11 % — ABNORMAL LOW (ref 12–46)
Lymphs Abs: 0.7 10*3/uL (ref 0.7–4.0)
Monocytes Relative: 14 % — ABNORMAL HIGH (ref 3–12)

## 2011-01-29 LAB — COMPREHENSIVE METABOLIC PANEL
AST: 15 U/L (ref 0–37)
Albumin: 3.9 g/dL (ref 3.5–5.2)
CO2: 22 mEq/L (ref 19–32)
Calcium: 9 mg/dL (ref 8.4–10.5)
Creatinine, Ser: 0.85 mg/dL (ref 0.4–1.2)
GFR calc Af Amer: >60 mL/min (ref >60)
GFR calc non Af Amer: >60 mL/min (ref >60)
Total Protein: 7.5 g/dL (ref 6.0–8.3)

## 2011-01-29 LAB — CBC
MCHC: 34.2 g/dL (ref 30.0–36.0)
MCV: 92 fL (ref 78.0–100.0)
Platelets: 178 10*3/uL (ref 150–400)
RDW: 13.7 % (ref 11.5–15.5)

## 2011-01-29 LAB — URINE MICROSCOPIC-ADD ON

## 2011-01-29 LAB — POCT PREGNANCY, URINE: Preg Test, Ur: NEGATIVE

## 2011-02-02 LAB — POCT URINALYSIS DIP (DEVICE)
Bilirubin Urine: NEGATIVE
Nitrite: NEGATIVE
Protein, ur: NEGATIVE mg/dL
pH: 7 (ref 5.0–8.0)

## 2011-02-03 LAB — ABO/RH: RH Type: POSITIVE

## 2011-02-03 LAB — DIFFERENTIAL
Basophils Absolute: 0.2 10*3/uL — ABNORMAL HIGH (ref 0.0–0.1)
Eosinophils Absolute: 0.2 10*3/uL (ref 0.0–0.7)
Eosinophils Relative: 2 % (ref 0–5)
Lymphocytes Relative: 34 % (ref 12–46)
Lymphs Abs: 3.4 10*3/uL (ref 0.7–4.0)
Neutrophils Relative %: 56 % (ref 43–77)

## 2011-02-03 LAB — STREP B DNA PROBE

## 2011-02-03 LAB — URINALYSIS, ROUTINE W REFLEX MICROSCOPIC
Bilirubin Urine: NEGATIVE
Glucose, UA: NEGATIVE mg/dL
Hgb urine dipstick: NEGATIVE
Specific Gravity, Urine: 1.027 (ref 1.005–1.030)
pH: 6.5 (ref 5.0–8.0)

## 2011-02-03 LAB — CBC
Hemoglobin: 15 g/dL (ref 12.0–15.0)
MCHC: 33.5 g/dL (ref 30.0–36.0)
MCV: 92.1 fL (ref 78.0–100.0)
RBC: 4.86 MIL/uL (ref 3.87–5.11)
WBC: 10.3 10*3/uL (ref 4.0–10.5)

## 2011-02-03 LAB — COMPREHENSIVE METABOLIC PANEL
ALT: 11 U/L (ref 0–35)
CO2: 24 mEq/L (ref 19–32)
Calcium: 8.7 mg/dL (ref 8.4–10.5)
Chloride: 107 mEq/L (ref 96–112)
Creatinine, Ser: 0.75 mg/dL (ref 0.4–1.2)
GFR calc non Af Amer: >60 mL/min (ref >60)
Glucose, Bld: 93 mg/dL (ref 70–99)
Total Bilirubin: 0.9 mg/dL (ref 0.3–1.2)

## 2011-02-03 LAB — POCT I-STAT, CHEM 8
BUN: 7 mg/dL (ref 6–23)
Calcium, Ion: 1.18 mmol/L (ref 1.12–1.32)
HCT: 46 % (ref 36.0–46.0)
Sodium: 141 mEq/L (ref 135–145)
TCO2: 25 mmol/L (ref 0–100)

## 2011-02-03 LAB — RUBELLA ANTIBODY, IGM: Rubella: IMMUNE

## 2011-02-03 LAB — LIPASE, BLOOD: Lipase: 27 U/L (ref 11–59)

## 2011-02-03 LAB — HEPATITIS B SURFACE ANTIGEN: Hepatitis B Surface Ag: NEGATIVE

## 2011-02-03 LAB — HIV ANTIBODY (ROUTINE TESTING W REFLEX): HIV: NONREACTIVE

## 2011-03-03 NOTE — Discharge Summary (Signed)
Stacie Carrillo, Stacie Carrillo             ACCOUNT NO.:  [id number]   MEDICAL RECORD NO.:  [id number]          PATIENT TYPE:  INP   LOCATION:  9112                          FACILITY:  [facility]   PHYSICIAN:  Stacie Bossier, MD        DATE OF BIRTH:  [birth year]   DATE OF ADMISSION:  DATE OF DISCHARGE:  06/18/2007                               DISCHARGE SUMMARY   ADMISSION DIAGNOSIS:  Term pregnancy for a 30 year old, G 3, P 2, 0, 0,  2, scheduled for repeat low transverse cesarean section.   DISCHARGE DIAGNOSIS:  Postoperative day #2 status post uncomplicated  repeat low transverse cesarean section with vertical skin incision.   HOSPITAL COURSE:  The patient was admitted on June 16, 2007, for  repeat cesarean section. The patient had a term pregnancy and was then a  G 3, P 2, 0, 0, 2. Patient was taken back to the O. R. and underwent  cesarean section procedure. The procedure rendered a viable female with  Apgars of 9 and 10. Cord TH was 7.37 at delivery. Estimated blood loss  was less than 500 cc. Placenta was sent to pathology. Procedure was  uncomplicated and a vertical skin incision was utilized. However the  uterus was opened transversely.   The patient was sent to the recovery room in excellent stable condition.  Postoperatively the patient did well and had no complications. The  patient remained afebrile throughout the remainder of her hospital stay.  On date of discharge the patient's temperature was 98.1, pulse 77,  respirations 20, blood pressure 119/74.   The patient's pain was controlled. Bleeding was decreased. Her skin  incision was clean, dry and intact. On postoperative day #2 the patient  was desiring to go home for early discharge and given that her  postoperative course was routine and stable she was deemed fit for  discharge at that time.   SIGNIFICANT FINDINGS:  Preoperative CBC was notable for a hemoglobin of  13.6 on August 27th. Postoperative CBC on date of discharge,  August  30th, was notable for hemoglobin 11.1. White blood cell count was 13.2  at that time. Platelet count was 173,000. The patient's prenatal labs:  The patient's blood type was O Positive, antibody negative. RPR was  negative, rubella immune.   MEDICATIONS AT TIME OF DISCHARGE:  1. Percocet 5/325 one to two p.o. q.4-6h. p.r.n. pain.  2. Motrin 600 mg p.o. q.6h. p.r.n. pain.  3. Colace 100 mg p.o. b.i.d. p.r.n. constipation.  4. Micronor one p.o. daily.  5. Prenatal vitamins one p.o. daily.   DISCHARGE INSTRUCTIONS:  1. Patient is to take medications as mentioned previously.  2. Patient is to have staples removed on postoperative day #7 by Siri Cole Nurse.  3. Patient is to maintain pelvic rest for six weeks with nothing per      vagina over this time.  4. The patient is not to do any heavy lifting for six weeks.  5. The patient is to follow up in six weeks with [facility]  or earlier if she has any concerning      symptoms including fever, increased abdominal pain, pain      uncontrolled with medication, drainage, erythema, discharge from      the wound site, fevers or chills or any other concerning symptoms.   DISCHARGE CONDITION:  Stable, excellent. Patient is discharged home with  infant.      Myrtie Soman, MD      Stacie Bossier, MD  Electronically Signed    TE/MEDQ  D:  06/18/2007  T:  06/19/2007  Job:  [id number]

## 2011-03-03 NOTE — Op Note (Signed)
Stacie Carrillo, Stacie Carrillo             ACCOUNT NO.:  [id number]   MEDICAL RECORD NO.:  [id number]          PATIENT TYPE:  INP   LOCATION:  9112                          FACILITY:  [facility]   PHYSICIAN:  Phil D. Okey Dupre, M.D.     DATE OF BIRTH:  02/25/1981   DATE OF PROCEDURE:  06/16/2007  DATE OF DISCHARGE:                               OPERATIVE REPORT   PROCEDURE:  Cesarean section low transverse.   PREOPERATIVE DIAGNOSIS:  Repeat cesarean section at term   POSTOPERATIVE DIAGNOSIS:  Repeat cesarean section at term   SURGEON:  Phil D. Okey Dupre, M.D.   FIRST ASSISTANT:  Dr. Nicholaus Bloom.   ANESTHESIA:  Spinal.   SPECIMENS TO PATHOLOGY:  Placenta.   POSTOPERATIVE CONDITION:  Satisfactory.   ESTIMATED BLOOD LOSS:  400 mL.   PROCEDURE:  Under satisfactory spinal anesthesia the patient in dorsal  supine position, a Foley catheter in urinary bladder, the abdomen was  prepped and draped in usual sterile manner.  Abdomen was entered through  a vertical subumbilical incision extended from just above the symphysis  pubis to just below the umbilicus.  The abdomen was entered by layers.  On entering the peritoneal cavity, the visceral peritoneum on the  anterior surface of the uterus was opened transversely just above the  bladder which was pushed away from the lower uterine segment and lower  segment was entered by sharp and blunt dissection and from an LOT  presentation the baby was easily delivered.  Cord doubly clamped, the  baby handed to pediatrician.  Samples taken from the cord for analysis.  Placenta spontaneously removed.  The uterus was explored, the cervix was  dilated with ring forceps and this uterus was closed with continuous  running 0 Vicryl on an atraumatic needle locking suture.  Area observed  for bleeding.  None was noted.  The pelvis was irrigated with normal  saline.  A loop 0 PDS suture was used for fascial closure.  Subcutaneous  bleeders were controlled with hot cautery.  Skin  edges were approximated  with skin staples.  Dry sterile dressing was applied.  Tape, instrument,  sponge and needle count reported correct at the end of procedure. The  patient was transferred to recovery room in satisfactory condition  having tolerated the procedure well with a Foley catheter draining clear  amber urine.     Phil D. Okey Dupre, M.D.  Electronically Signed    PDR/MEDQ  D:  06/16/2007  T:  06/17/2007  Job:  [id number]

## 2011-03-06 NOTE — Discharge Summary (Signed)
[facility]  Patient:    Stacie Carrillo, Stacie Carrillo                    MRN: [id number] Adm. Date:  [id number] Disc. Date: [id number] Attending:  Michaelle Copas Dictator:   Zella Ball, M.D.                           Discharge Summary  DATE OF BIRTH:                    25-Aug-1981  ADMISSION DIAGNOSIS:              Post term intrauterine pregnancy.  DISCHARGE DIAGNOSIS:              Post term delivery by low transverse cesarean section.  BRIEF HISTORY:                    This is a 30 year old African-American woman, G2, P1-0-0-1, who presented at [redacted] weeks gestation for a previously scheduled low transverse repeat C section.  The patient had had a previous cesarean section in 1998.  PROCEDURES:                       Repeat low transverse cesarean section.  HOSPITAL COURSE:                  The patient was admitted for post term repeat low cesarean section; however, upon admission to the hospital, the patient decided she would like to have a trail of vaginal labor and was started on low-dose Pitocin per protocol.  However, after about an hour of trial of labor, the patient decided that she would like to instead have the repeat C section, and the patient was taken back to the operating room at 6:30 p.m. on July 19, delivered of a viable female infant, Apgars 9/9 at one and five-minute intervals, respectively.  The infant weighed 9 pounds 4 ounces.  This operative report has been previously dictated.  The patients postop recovery was uncomplicated except for some minor postanesthesia itching that was controlled by use of antihistamines.  The patient was both breast and bottle feeding her baby and was given a shot of Depo-Provera as an anticontraceptive prior to discharge.  DISCHARGE MEDICATIONS:            The patient was discharged on ibuprofen 600 mg for pain control.  FOLLOWUP:                         The patient was advised to return to  the MAU for staple removal in two days after discharge and then was advised to follow up for her regular postpartum visit in six weeks at [facility]. DD:  05/09/00 TD:  05/12/00 Job: [id number]

## 2011-03-06 NOTE — Op Note (Signed)
[facility]  Patient:    Stacie Carrillo, STAIR                    MRN: [id number] Proc. Date: 05/06/00 Adm. Date:  [id number] Attending:  Michaelle Copas Dictator:   Jamey Reas, M.D.                           Operative Report  DESCRIPTION OF PROCEDURE:  Informed consent was obtained.  The patient was brought to the operating suite.  Spinal anesthesia was introduced.  The area was prepped and draped in the normal sterile fashion.  A low transverse Pfannenstiel incision was made in the skin.  The cavity was opened down to the peritoneum in the normal fashion paying close attention to avoid the urinary bladder.  Multiple significant scarring was noted from prior C-section, especially on the left side.  A low transverse incision was made in the uterus.  A viable female infant was delivered without complication.  The uterus was then extracted and was visualized.  There was normal-appearing ovaries as well as fallopian tubes.  The placenta was delivered spontaneously using manual extracting with no resulting remaining membranes.  The uterus was then closed using 1-0 chromic suture.  It was double sutured without complication.  There was no evidence of bleeding.  The uterus was then replaced into the uterine cavity.  The area was irrigated thoroughly using warm saline solution.  The peritoneal cavity was then closed using 1-0 chromic.  The bladder flap was then closed without complication.  The fascia was closed in a normal fashion.  A subcutaneous stitch was placed with the subcutaneous tissue reapproximated.  Staples were then placed.  The patient remained in stable condition.  She was taken to the recovery room.  There was no significant bleeding that was found.  No drains placed.  No specimens.  No complications. DD:  05/06/00 TD:  05/07/00 Job: [id number]

## 2011-04-02 ENCOUNTER — Inpatient Hospital Stay (HOSPITAL_COMMUNITY)
Admission: AD | Admit: 2011-04-02 | Discharge: 2011-04-02 | Disposition: A | Discharge status: Home / Self Care | Payer: Medicaid Other | Source: Ambulatory Visit | Attending: Obstetrics | Admitting: Obstetrics

## 2011-04-02 DIAGNOSIS — O99891 Other specified diseases and conditions complicating pregnancy: Secondary | ICD-10-CM | POA: Insufficient documentation

## 2011-04-02 DIAGNOSIS — O9989 Other specified diseases and conditions complicating pregnancy, childbirth and the puerperium: Secondary | ICD-10-CM

## 2011-04-02 DIAGNOSIS — O212 Late vomiting of pregnancy: Secondary | ICD-10-CM | POA: Insufficient documentation

## 2011-04-02 DIAGNOSIS — K5289 Other specified noninfective gastroenteritis and colitis: Secondary | ICD-10-CM

## 2011-04-02 LAB — URINALYSIS, ROUTINE W REFLEX MICROSCOPIC
Glucose, UA: NEGATIVE mg/dL
Ketones, ur: NEGATIVE mg/dL
Leukocytes, UA: NEGATIVE
Nitrite: NEGATIVE
Protein, ur: NEGATIVE mg/dL
Urobilinogen, UA: 0.2 mg/dL (ref 0.0–1.0)

## 2011-04-26 ENCOUNTER — Inpatient Hospital Stay (HOSPITAL_COMMUNITY)
Admission: AD | Admit: 2011-04-26 | Discharge: 2011-04-27 | DRG: 778 | Disposition: A | Discharge status: Home / Self Care | Payer: Medicaid Other | Source: Ambulatory Visit | Attending: Obstetrics & Gynecology | Admitting: Obstetrics & Gynecology

## 2011-04-26 ENCOUNTER — Encounter (HOSPITAL_COMMUNITY): Payer: Self-pay | Admitting: *Deleted

## 2011-04-26 ENCOUNTER — Inpatient Hospital Stay (HOSPITAL_COMMUNITY): Payer: Self-pay | Admitting: Obstetrics & Gynecology

## 2011-04-26 DIAGNOSIS — E282 Polycystic ovarian syndrome: Secondary | ICD-10-CM

## 2011-04-26 DIAGNOSIS — Z98891 History of uterine scar from previous surgery: Secondary | ICD-10-CM

## 2011-04-26 DIAGNOSIS — O99810 Abnormal glucose complicating pregnancy: Secondary | ICD-10-CM | POA: Diagnosis present

## 2011-04-26 DIAGNOSIS — O4700 False labor before 37 completed weeks of gestation, unspecified trimester: Secondary | ICD-10-CM

## 2011-04-26 DIAGNOSIS — O34219 Maternal care for unspecified type scar from previous cesarean delivery: Secondary | ICD-10-CM

## 2011-04-26 DIAGNOSIS — O24410 Gestational diabetes mellitus in pregnancy, diet controlled: Secondary | ICD-10-CM

## 2011-04-26 DIAGNOSIS — O479 False labor, unspecified: Secondary | ICD-10-CM

## 2011-04-26 HISTORY — DX: Obesity, unspecified: E66.9

## 2011-04-26 HISTORY — DX: Depression, unspecified: F32.A

## 2011-04-26 HISTORY — DX: Major depressive disorder, single episode, unspecified: F32.9

## 2011-04-26 HISTORY — DX: Polycystic ovarian syndrome: E28.2

## 2011-04-26 LAB — URINALYSIS, ROUTINE W REFLEX MICROSCOPIC
Bilirubin Urine: NEGATIVE
Ketones, ur: NEGATIVE mg/dL
Leukocytes, UA: NEGATIVE
Nitrite: NEGATIVE
Protein, ur: NEGATIVE mg/dL
Urobilinogen, UA: 1 mg/dL (ref 0.0–1.0)

## 2011-04-26 LAB — WET PREP, GENITAL
Trich, Wet Prep: NONE SEEN
Yeast Wet Prep HPF POC: NONE SEEN

## 2011-04-26 MED ORDER — RHO D IMMUNE GLOBULIN 1500 UNIT/2ML IJ SOLN: 300.0000 ug | Freq: Once | INTRAMUSCULAR | Status: DC

## 2011-04-26 MED ORDER — DOCUSATE SODIUM 100 MG PO CAPS
100.0000 mg | ORAL_CAPSULE | Freq: Every day | ORAL | Status: DC
  Filled 2011-04-26: qty 1

## 2011-04-26 MED ORDER — NIFEDIPINE 10 MG PO CAPS
10.0000 mg | ORAL_CAPSULE | Freq: Once | ORAL | Status: AC
  Administered 2011-04-26: 10 mg via ORAL
  Filled 2011-04-26: qty 1

## 2011-04-26 MED ORDER — ZOLPIDEM TARTRATE 10 MG PO TABS
10.0000 mg | ORAL_TABLET | Freq: Every evening | ORAL | Status: DC | PRN
  Filled 2011-04-26: qty 1

## 2011-04-26 MED ORDER — CALCIUM CARBONATE ANTACID 500 MG PO CHEW
2.0000 | CHEWABLE_TABLET | ORAL | Status: DC | PRN
  Filled 2011-04-26: qty 2

## 2011-04-26 MED ORDER — ZOLPIDEM TARTRATE 10 MG PO TABS
10.0000 mg | ORAL_TABLET | Freq: Every evening | ORAL | Status: DC | PRN
  Administered 2011-04-26: 10 mg via ORAL

## 2011-04-26 MED ORDER — NIFEDIPINE 10 MG PO CAPS
10.0000 mg | ORAL_CAPSULE | Freq: Once | ORAL | Status: AC
  Administered 2011-04-26: 10 mg via ORAL

## 2011-04-26 MED ORDER — NIFEDIPINE ER OSMOTIC RELEASE 30 MG PO TB24
30.0000 mg | ORAL_TABLET | Freq: Once | ORAL | Status: AC
  Administered 2011-04-27: 30 mg via ORAL
  Filled 2011-04-26: qty 1

## 2011-04-26 MED ORDER — LACTATED RINGERS IV SOLN
INTRAVENOUS | Status: DC
  Administered 2011-04-26 – 2011-04-27 (×2): via INTRAVENOUS
  Filled 2011-04-26 (×3): qty 1000

## 2011-04-26 MED ORDER — NIFEDIPINE 10 MG PO CAPS
ORAL_CAPSULE | ORAL | Status: AC
  Administered 2011-04-26: 10 mg via ORAL
  Filled 2011-04-26: qty 1

## 2011-04-26 MED ORDER — PRENATAL PLUS 27-1 MG PO TABS
1.0000 | ORAL_TABLET | Freq: Every day | ORAL | Status: DC
  Filled 2011-04-26: qty 1

## 2011-04-26 MED ORDER — ACETAMINOPHEN 325 MG PO TABS
650.0000 mg | ORAL_TABLET | ORAL | Status: DC | PRN
  Administered 2011-04-26: 650 mg via ORAL
  Filled 2011-04-26: qty 2

## 2011-04-26 MED ORDER — FAMOTIDINE 20 MG PO TABS
20.0000 mg | ORAL_TABLET | Freq: Two times a day (BID) | ORAL | Status: DC
  Administered 2011-04-26: 20 mg via ORAL
  Filled 2011-04-26: qty 1

## 2011-04-26 NOTE — Progress Notes (Cosign Needed)
No history of preterm labor with this pregnancy.

## 2011-04-26 NOTE — Initial Assessments (Signed)
Pt reports uc's since 10:00. Denies LOF or bleeding. Pt reports + FM

## 2011-04-26 NOTE — ED Provider Notes (Signed)
Stacie Carrillo is a 30 y.o. female patient. No diagnosis found. Past Medical History  Diagnosis Date  . No pertinent past medical history    OB History    Grav Para Term Preterm Abortions TAB SAB Ect Mult Living   4 3 3             [redacted]w[redacted]d Estimated Date of Delivery: 06/07/11 Allergies  Allergen Reactions  . Latex Other (See Comments)    Pt reports "exema" with latex use   Active Problems:  * No active hospital problems. *   Blood pressure 110/61, temperature 97.8 F (36.6 C), temperature source Oral, resp. rate 18, height 5\' 5"  (1.651 m), weight 225 lb (102.059 kg), last menstrual period 08/31/2010.  Maternal Medical History:  Reason for admission: Reason for admission: contractions.  Contractions: Onset was 3-5 hours ago.   Frequency: regular.   Duration is approximately 5 minutes.   Perceived severity is mild.    Fetal activity: Perceived fetal activity is normal.   Last perceived fetal movement was within the past hour.    Prenatal complications: no prenatal complications   Maternal Exam:  Uterine Assessment: Contraction strength is mild.  Contraction duration is 5 minutes. Contraction frequency is regular.   Abdomen: Patient reports no abdominal tenderness. Surgical scars: low transverse.   Fetal presentation: vertex  Introitus: Normal vulva. Vagina is positive for vaginal discharge.    Assessment & Plan  Rodell Perna. 04/26/2011   Avon Gully. Joyanna Kleman 05/06/11 0720

## 2011-04-26 NOTE — Progress Notes (Signed)
Wet prep obtained.   

## 2011-04-26 NOTE — Consult Note (Signed)
Consulted with Dr Tamela Oddi, give Procardia 10 mg, repeat as needed, po hydrate.

## 2011-04-26 NOTE — H&P (Signed)
Stacie Carrillo is a 30 y.o. female presenting for UCs. Maternal Medical History:  Reason for admission: Reason for admission: contractions.  Reason for Admission:   nauseaContractions: Onset was 6-12 hours ago.   Frequency: irregular.   Perceived severity is moderate.    Fetal activity: Perceived fetal activity is normal.   Last perceived fetal movement was within the past hour.    Prenatal complications: No bleeding, preterm labor (previous PTL-->term delivery) or substance abuse.   GDM diet controlled, multiple C/D, classical C/D    OB History    Grav Para Term Preterm Abortions TAB SAB Ect Mult Living   4 3 3  0 0 0 0 0 0 3     Past Medical History  Diagnosis Date  . No pertinent past medical history   . Depression   . Gestational diabetes mellitus, diet-controlled 04/26/2011  . Polycystic ovarian syndrome   . Obesity    Past Surgical History  Procedure Date  . Cesarean section    Family History: family history includes Diabetes in her maternal grandfather. Social History:  reports that she has been smoking Cigarettes.  She has a 4.75 pack-year smoking history. She does not have any smokeless tobacco history on file. She reports that she does not drink alcohol or use illicit drugs.  Review of Systems  Constitutional: Negative for fever.  Eyes: Negative for blurred vision.  Respiratory: Negative for shortness of breath.   Gastrointestinal: Negative for nausea and vomiting.  Genitourinary: Negative.   Skin: Negative for rash.  Neurological: Negative for headaches.    Dilation: Closed Effacement (%): 50 Exam by:: K HArris Blood pressure 126/72, pulse 84, temperature 98 F (36.7 C), temperature source Oral, resp. rate 20, height 5\' 5"  (1.651 m), weight 102.059 kg (225 lb), last menstrual period 08/31/2010. Maternal Exam:  Uterine Assessment: Contraction strength is mild.  Contraction frequency is irregular.   Abdomen: Patient reports no abdominal tenderness.  Introitus: not evaluated.     Fetal Exam Fetal Monitor Review: Baseline rate: 140.  Variability: moderate (6-25 bpm).   Pattern: accelerations present.    Fetal State Assessment: Category I - tracings are normal.     Physical Exam  Constitutional: She appears well-developed.  HENT:  Head: Normocephalic.  Neck: Neck supple. No thyromegaly present.  Cardiovascular: Normal rate and regular rhythm.   Respiratory: Breath sounds normal.  GI: Soft. Bowel sounds are normal.  Skin: No rash noted.    Prenatal labs: ABO, Rh: O POS (01/08 1014) Antibody:   Rubella:   RPR:    HBsAg:    HIV:    GBS:     Assessment/Plan: 30 y.o. with an IUP @ [redacted]w[redacted]d w/threatened PTL--h/o multiple C/D; h/o a classical C/D.  Pregnancy complicated by GDM-diet controlled. FHT c/w fetal well-being.  23 hr OBSV Tocolysis w/po Procardia. Continuous TOCO/FHT Bedrest ADA diet; check CBGs   JACKSON-MOORE,Demarie Uhlig A 04/26/2011, 11:27 PM

## 2011-04-27 LAB — BASIC METABOLIC PANEL
CO2: 22 mEq/L (ref 19–32)
Chloride: 104 mEq/L (ref 96–112)
Creatinine, Ser: 0.7 mg/dL (ref 0.50–1.10)
GFR calc Af Amer: >60 mL/min (ref >60)
Potassium: 4.1 mEq/L (ref 3.5–5.1)
Sodium: 135 mEq/L (ref 135–145)

## 2011-04-27 LAB — CBC
HCT: 34.6 % — ABNORMAL LOW (ref 36.0–46.0)
Hemoglobin: 12.3 g/dL (ref 12.0–15.0)
MCV: 88 fL (ref 78.0–100.0)
RDW: 13.3 % (ref 11.5–15.5)
WBC: 11.7 10*3/uL — ABNORMAL HIGH (ref 4.0–10.5)

## 2011-04-27 LAB — TYPE AND SCREEN: Antibody Screen: NEGATIVE

## 2011-04-27 LAB — GLUCOSE, CAPILLARY
Glucose-Capillary: 95 mg/dL (ref 70–99)
Glucose-Capillary: 84 mg/dL (ref 70–99)

## 2011-04-27 NOTE — Discharge Summary (Signed)
Physician Discharge Summary  Patient ID: Stacie Carrillo MRN: [id number] DOB/AGE: 31-08-1981 31 y.o.  Admit date: 04/26/2011 Discharge date: 04/27/2011  Admission Diagnoses:  Discharge Diagnoses:  Principal Problem:  *Threatened preterm labor, antepartum Active Problems:  Gestational diabetes mellitus, diet-controlled  History of C-section   Discharged Condition: good  Hospital Course:  The patient was admitted.  Tocolysis was achieved with oral Procardia.    Consults: none  Significant Diagnostic Studies: None.  Treatments: IV hydration, tocolysis  Discharge Exam: Blood pressure 109/63, pulse 82, temperature 97.4 F (36.3 C), temperature source Axillary, resp. rate 18, height 5\' 5"  (1.651 m), weight 102.059 kg (225 lb), last menstrual period 08/31/2010. General appearance: alert  Disposition: Home or Self Care   Current Discharge Medication List    CONTINUE these medications which have NOT CHANGED   Details  fluocinonide (LIDEX) 0.05 % cream Apply 1 application topically 2 (two) times daily. For eczema     omeprazole (PRILOSEC) 20 MG capsule Take 20 mg by mouth daily. For heartburn     prenatal vitamin w/FE, FA (PRENATAL 1 + 1) 27-1 MG TABS Take 1 tablet by mouth daily.      zolpidem (AMBIEN) 10 MG tablet Take 10 mg by mouth at bedtime as needed. For sleep        Follow-up Information    Follow up with HARPER,CHARLES A, MD in 3 days.   Contact information:   [address] [place] [address] [phone]          Signed: Roseanna Rainbow 04/27/2011, 9:24 AM

## 2011-04-27 NOTE — Progress Notes (Signed)
  S: none  O: BP 109/63  Pulse 82  Temp(Src) 97.4 F (36.3 C) (Axillary)  Resp 18  Ht 5\' 5"  (1.651 m)  Wt 102.059 kg (225 lb)  BMI 37.44 kg/m2  LMP 08/31/2010   KVQ:QVZDGLOV: 140 bpm, Variability: Good {> 6 bpm) and Accelerations: Reactive Toco: None FIE:[id number]: Closed Effacement (%): 50 Cervical Position: Anterior Presentation: Vertex Exam by:: K HArris  A/P- 30 y.o. admitted with preterm labor  Preterm labor management: discontinue work Dating:  [redacted]w[redacted]d  FWB:  FHT c/w FWB

## 2011-05-05 ENCOUNTER — Inpatient Hospital Stay (HOSPITAL_COMMUNITY)
Admission: AD | Admit: 2011-05-05 | Discharge: 2011-05-05 | Disposition: A | Discharge status: Home Health | Payer: Medicaid Other | Source: Ambulatory Visit | Attending: Obstetrics & Gynecology | Admitting: Obstetrics & Gynecology

## 2011-05-05 ENCOUNTER — Encounter (HOSPITAL_COMMUNITY): Payer: Self-pay | Admitting: Advanced Practice Midwife

## 2011-05-05 DIAGNOSIS — K529 Noninfective gastroenteritis and colitis, unspecified: Secondary | ICD-10-CM

## 2011-05-05 DIAGNOSIS — O99810 Abnormal glucose complicating pregnancy: Secondary | ICD-10-CM | POA: Insufficient documentation

## 2011-05-05 DIAGNOSIS — O4700 False labor before 37 completed weeks of gestation, unspecified trimester: Secondary | ICD-10-CM

## 2011-05-05 DIAGNOSIS — O24410 Gestational diabetes mellitus in pregnancy, diet controlled: Secondary | ICD-10-CM

## 2011-05-05 DIAGNOSIS — Z98891 History of uterine scar from previous surgery: Secondary | ICD-10-CM

## 2011-05-05 LAB — COMPREHENSIVE METABOLIC PANEL
ALT: 5 U/L (ref 0–35)
AST: 10 U/L (ref 0–37)
Albumin: 2.7 g/dL — ABNORMAL LOW (ref 3.5–5.2)
CO2: 19 mEq/L (ref 19–32)
Calcium: 9.4 mg/dL (ref 8.4–10.5)
Chloride: 104 mEq/L (ref 96–112)
Creatinine, Ser: 0.53 mg/dL (ref 0.50–1.10)
Sodium: 134 mEq/L — ABNORMAL LOW (ref 135–145)
Total Bilirubin: 0.9 mg/dL (ref 0.3–1.2)

## 2011-05-05 LAB — URINALYSIS, ROUTINE W REFLEX MICROSCOPIC
Hgb urine dipstick: NEGATIVE
Nitrite: NEGATIVE
Protein, ur: NEGATIVE mg/dL
Specific Gravity, Urine: 1.015 (ref 1.005–1.030)
Urobilinogen, UA: 2 mg/dL — ABNORMAL HIGH (ref 0.0–1.0)

## 2011-05-05 LAB — CBC
MCV: 87.2 fL (ref 78.0–100.0)
Platelets: 192 10*3/uL (ref 150–400)
RBC: 4.29 MIL/uL (ref 3.87–5.11)
RDW: 13.4 % (ref 11.5–15.5)
WBC: 13.4 10*3/uL — ABNORMAL HIGH (ref 4.0–10.5)

## 2011-05-05 MED ORDER — NIFEDIPINE ER OSMOTIC RELEASE 30 MG PO TB24
60.0000 mg | ORAL_TABLET | Freq: Once | ORAL | Status: AC
  Administered 2011-05-05: 60 mg via ORAL
  Filled 2011-05-05: qty 2

## 2011-05-05 MED ORDER — DIPHENHYDRAMINE HCL 50 MG/ML IJ SOLN
25.0000 mg | Freq: Once | INTRAMUSCULAR | Status: AC
  Administered 2011-05-05: 25 mg via INTRAVENOUS
  Filled 2011-05-05: qty 1

## 2011-05-05 MED ORDER — PROCHLORPERAZINE EDISYLATE 5 MG/ML IJ SOLN
10.0000 mg | Freq: Once | INTRAMUSCULAR | Status: DC
  Filled 2011-05-05: qty 2

## 2011-05-05 MED ORDER — PROMETHAZINE HCL 25 MG/ML IJ SOLN
12.5000 mg | Freq: Once | INTRAMUSCULAR | Status: AC
  Administered 2011-05-05: 12.5 mg via INTRAVENOUS
  Filled 2011-05-05: qty 1

## 2011-05-05 MED ORDER — LACTATED RINGERS IV SOLN
Freq: Once | INTRAVENOUS | Status: AC
  Administered 2011-05-05: 12:00:00 via INTRAVENOUS

## 2011-05-05 MED ORDER — DEXAMETHASONE SODIUM PHOSPHATE 10 MG/ML IJ SOLN
10.0000 mg | Freq: Once | INTRAMUSCULAR | Status: AC
  Administered 2011-05-05: 10 mg via INTRAVENOUS
  Filled 2011-05-05: qty 1

## 2011-05-05 MED ORDER — M.V.I. ADULT IV INJ
10.0000 mL | Freq: Once | INTRAVENOUS | Status: AC
  Administered 2011-05-05: 10 mL via INTRAVENOUS
  Filled 2011-05-05: qty 10

## 2011-05-05 NOTE — Progress Notes (Signed)
MD sent pt to MAU for IV therapy; c/o having N&V for 3 days;

## 2011-05-05 NOTE — Progress Notes (Signed)
Pt states she has had nausea and vomiting for 3 days. Was seen in the office today and sent to MAU with orders for IVF and labs. Pt is able to keep down Gatorade this am. Some irreg contractions. Is taking Procardia bid, did not take this am.

## 2011-05-05 NOTE — ED Provider Notes (Signed)
Stacie Carrillo is a 30 y.o. female sent to MAU from [facility] for nausea and vomiting x 3 days. The pt states this is a new problem. She reports vomiting about 3x/day, constant, sharp, mild right groin pain independent of UC's and has had difficulty keeping down food and fluids. She has taken reflux medication but no antiemetics at home. She denies fever, chills or sick contacts. She is on Procardia for preterm UC's, but was unable to take her dose this morning. She reports mild-mod UC's Q 4-6 minutes and denies LOF or vaginal bleeding. She reports resolution of N/V since receiving antiemetic in MAU and requesting solid food.  Maternal Medical History:  Reason for admission: Reason for admission: contractions and nausea.  Contractions: Onset was 1 week ago.   Frequency: regular.   Perceived severity is moderate.    Fetal activity: Perceived fetal activity is normal.   Last perceived fetal movement was within the past hour.    Prenatal complications: Preterm labor.   Prenatal Complications - Diabetes: gestational. Diabetes is managed by diet.      OB History    Grav Para Term Preterm Abortions TAB SAB Ect Mult Living   4 3 3  0 0 0 0 0 0 3     Past Medical History  Diagnosis Date  . No pertinent past medical history   . Depression   . Gestational diabetes mellitus, diet-controlled 04/26/2011  . Polycystic ovarian syndrome   . Obesity    Past Surgical History  Procedure Date  . Cesarean section    Family History: family history includes Diabetes in her maternal grandfather. Social History:  reports that she has been smoking Cigarettes.  She has a 4.75 pack-year smoking history. She does not have any smokeless tobacco history on file. She reports that she does not drink alcohol or use illicit drugs.  Review of Systems  Constitutional: Negative for fever and chills.  Eyes: Negative for blurred vision.  Gastrointestinal: Positive for heartburn, nausea, vomiting and abdominal pain  (right goin). Negative for diarrhea and constipation.  Genitourinary: Negative for dysuria, urgency, frequency and flank pain.  Neurological: Negative for dizziness and headaches.      Blood pressure 119/76, pulse 92, temperature 97.4 F (36.3 C), temperature source Oral, resp. rate 18, height 5\' 5"  (1.651 m), weight 100.245 kg (221 lb), last menstrual period 08/31/2010, SpO2 98.00%. Maternal Exam:  Uterine Assessment: Contraction strength is moderate.  Contraction frequency is regular.   Abdomen: Surgical scars: low transverse.     Fetal Exam Fetal Monitor Review: Mode: ultrasound.   Baseline rate: 130.  Variability: moderate (6-25 bpm).   Pattern: accelerations present and no decelerations.    Fetal State Assessment: Category I - tracings are normal.     Physical Exam  Constitutional: She is oriented to person, place, and time. She appears well-developed and well-nourished. No distress.  Cardiovascular: Normal rate.   Respiratory: Effort normal.  GI: Soft. Bowel sounds are normal. She exhibits no distension and no mass. There is no tenderness. There is no rebound and no guarding.  Neurological: She is alert and oriented to person, place, and time.  Skin: Skin is warm and dry.    Prenatal labs: ABO, Rh: O POS (07/09 0025) Antibody: NEG (07/09 0025) Rubella:   RPR:    HBsAg:    HIV:    GBS:     Assessment: 1. Preterm contractions 2. Fetal Wellbeing: Category I  3. Possible gastroenteritis, stabilizing on antiemetics 4. 35.2 week IUP  5. Hx C/S x 3 including classical incision 6. Diet-controlled GDM, normal blood glucose  Plan:  1. Pt receiving IV fluids, Phenergan, Decadron and Benadryl per Dr. Verdell Carmine orders. 2. Will consult with Dr. Clearance Coots RE: cervical exam and diet orders.     Genavive Kubicki 05/05/2011, 1:37 PM

## 2011-05-05 NOTE — ED Provider Notes (Signed)
Results for orders placed during the hospital encounter of 05/05/11 (from the past 24 hour(s))  URINALYSIS, ROUTINE W REFLEX MICROSCOPIC     Status: Abnormal   Collection Time   05/05/11 11:30 AM      Component Value Range   Color, Urine YELLOW  YELLOW    Appearance CLEAR  CLEAR    Specific Gravity, Urine 1.015  1.005 - 1.030    pH 7.5  5.0 - 8.0    Glucose, UA NEGATIVE  NEGATIVE (mg/dL)   Hgb urine dipstick NEGATIVE  NEGATIVE    Bilirubin Urine NEGATIVE  NEGATIVE    Ketones, ur NEGATIVE  NEGATIVE (mg/dL)   Protein, ur NEGATIVE  NEGATIVE (mg/dL)   Urobilinogen, UA 2.0 (*) 0.0 - 1.0 (mg/dL)   Nitrite NEGATIVE  NEGATIVE    Leukocytes, UA NEGATIVE  NEGATIVE   CBC     Status: Abnormal   Collection Time   05/05/11 11:35 AM      Component Value Range   WBC 13.4 (*) 4.0 - 10.5 (K/uL)   RBC 4.29  3.87 - 5.11 (MIL/uL)   Hemoglobin 13.6  12.0 - 15.0 (g/dL)   HCT 40.9  81.1 - 91.4 (%)   MCV 87.2  78.0 - 100.0 (fL)   MCH 31.7  26.0 - 34.0 (pg)   MCHC 36.4 (*) 30.0 - 36.0 (g/dL)   RDW 78.2  95.6 - 21.3 (%)   Platelets 192  150 - 400 (K/uL)  COMPREHENSIVE METABOLIC PANEL     Status: Abnormal   Collection Time   05/05/11 11:35 AM      Component Value Range   Sodium 134 (*) 135 - 145 (mEq/L)   Potassium 4.0  3.5 - 5.1 (mEq/L)   Chloride 104  96 - 112 (mEq/L)   CO2 19  19 - 32 (mEq/L)   Glucose, Bld 91  70 - 99 (mg/dL)   BUN 4 (*) 6 - 23 (mg/dL)   Creatinine, Ser 0.86  0.50 - 1.10 (mg/dL)   Calcium 9.4  8.4 - 57.8 (mg/dL)   Total Protein 7.7  6.0 - 8.3 (g/dL)   Albumin 2.7 (*) 3.5 - 5.2 (g/dL)   AST 10  0 - 37 (U/L)   ALT 5  0 - 35 (U/L)   Alkaline Phosphatase 148 (*) 39 - 117 (U/L)   Total Bilirubin 0.9  0.3 - 1.2 (mg/dL)   GFR calc non Af Amer >60  >60 (mL/min)   GFR calc Af Amer >60  >60 (mL/min)

## 2011-05-05 NOTE — Progress Notes (Signed)
S: Tolerating PO fluids. No change in UC's over past week.  O:   05/05/11 1431  Cervical Exam  Dilation 0  Effacement (%) Thick  Cervical Position Posterior  Cervical Consistency Soft  Vag. Bleeding None  Clots None  Station Ballotable;-3  Presentation Undeterminab  Exam by: Dorathy Kinsman, CNM  Membranes  Membrane Status Intact    A: 1.  Preterm UC's with no cervical change 2. N/V stable   P: 1. Per consult with Dr. Clearance Coots may advance diet as tolerated and D/C home after IV fluids completed if tolerating solids. 2. Has Zofran RX 3. F/U in 1 week at [facility] as scheduled

## 2011-05-26 ENCOUNTER — Other Ambulatory Visit: Payer: Self-pay | Admitting: Obstetrics

## 2011-05-28 ENCOUNTER — Encounter (HOSPITAL_COMMUNITY): Payer: Self-pay

## 2011-05-28 ENCOUNTER — Inpatient Hospital Stay (HOSPITAL_COMMUNITY)
Admission: AD | Admit: 2011-05-28 | Discharge: 2011-05-28 | Disposition: A | Discharge status: Home / Self Care | Payer: Medicaid Other | Source: Ambulatory Visit | Attending: Obstetrics | Admitting: Obstetrics

## 2011-05-28 ENCOUNTER — Encounter (HOSPITAL_COMMUNITY): Payer: Self-pay | Admitting: *Deleted

## 2011-05-28 ENCOUNTER — Encounter (HOSPITAL_COMMUNITY)
Admission: RE | Admit: 2011-05-28 | Discharge: 2011-05-28 | Disposition: A | Discharge status: Home / Self Care | Payer: Medicaid Other | Source: Ambulatory Visit | Attending: Obstetrics | Admitting: Obstetrics

## 2011-05-28 DIAGNOSIS — O24410 Gestational diabetes mellitus in pregnancy, diet controlled: Secondary | ICD-10-CM

## 2011-05-28 DIAGNOSIS — O4700 False labor before 37 completed weeks of gestation, unspecified trimester: Secondary | ICD-10-CM | POA: Insufficient documentation

## 2011-05-28 DIAGNOSIS — Z98891 History of uterine scar from previous surgery: Secondary | ICD-10-CM

## 2011-05-28 DIAGNOSIS — O99810 Abnormal glucose complicating pregnancy: Secondary | ICD-10-CM | POA: Insufficient documentation

## 2011-05-28 DIAGNOSIS — O34219 Maternal care for unspecified type scar from previous cesarean delivery: Secondary | ICD-10-CM | POA: Insufficient documentation

## 2011-05-28 HISTORY — DX: Gestational diabetes mellitus in pregnancy, unspecified control: O24.419

## 2011-05-28 LAB — CBC
MCH: 31.2 pg (ref 26.0–34.0)
MCHC: 35.7 g/dL (ref 30.0–36.0)
MCV: 87.6 fL (ref 78.0–100.0)
Platelets: 169 10*3/uL (ref 150–400)

## 2011-05-28 LAB — RPR: RPR Ser Ql: NONREACTIVE

## 2011-05-28 NOTE — Progress Notes (Signed)
Pt sent over from pre-op appointment due to contractions.  Pt states she has been contracting the past couple days, has subsided since coming to MAU.  Denies any bleeding or leaking of fluid.  + FM.  Has c/s scheduled for Monday.

## 2011-05-28 NOTE — Patient Instructions (Addendum)
19 Stacie Carrillo  05/28/2011   Your procedure is scheduled on:  06/01/11  Report to [facility] at 6 AM.  Call this number if you have problems the morning of surgery: [phone]   Remember:   Do not eat food:After Midnight.  Do not drink clear liquids: After Midnight.  Take these medicines the morning of surgery with A SIP OF WATER: NA   Do not wear jewelry, make-up or nail polish.  Do not wear lotions, powders, or perfumes. You may wear deodorant.  Do not shave 48 hours prior to surgery.  Do not bring valuables to the hospital.  Contacts, dentures or bridgework may not be worn into surgery.  Leave suitcase in the car. After surgery it may be brought to your room.  For patients admitted to the hospital, checkout time is 11:00 AM the day of discharge.   Patients discharged the day of surgery will not be allowed to drive home.  Name and phone number of your driver: NA  Special Instructions: use CHG wash per written instruction sheet    Please read over the following fact sheets that you were given: Care and Recovery After Surgery

## 2011-05-28 NOTE — Anesthesia Preprocedure Evaluation (Signed)
Anesthesia Evaluation  Name, MR# and DOB Patient awake  General Assessment Comment  Reviewed: Allergy & Precautions, H&P , Patient's Chart, lab work & pertinent test results  Airway Mallampati: II TM Distance: >3 FB Neck ROM: full    Dental No notable dental hx.    Pulmonary  clear to auscultation  pulmonary exam normalPulmonary Exam Normal breath sounds clear to auscultation none    Cardiovascular Exercise Tolerance: Good regular Normal    Neuro/Psych   GI/Hepatic/Renal   Endo/Other  (+)   Morbid obesity  Abdominal   Musculoskeletal   Hematology   Peds  Reproductive/Obstetrics    Anesthesia Other Findings             Anesthesia Physical Anesthesia Plan  ASA: III  Anesthesia Plan: Spinal   Post-op Pain Management:    Induction:   Airway Management Planned:   Additional Equipment:   Intra-op Plan:   Post-operative Plan:   Informed Consent: I have reviewed the patients History and Physical, chart, labs and discussed the procedure including the risks, benefits and alternatives for the proposed anesthesia with the patient or authorized representative who has indicated his/her understanding and acceptance.   Dental Advisory Given  Plan Discussed with: CRNA  Anesthesia Plan Comments: ( Discussed spinal anesthetic, and patient consents to the procedure:  included risk of possible headache,backache, failed block, allergic reaction, and nerve injury. This patient was asked if she had any questions or concerns before the procedure started. )        Anesthesia Quick Evaluation

## 2011-05-28 NOTE — Progress Notes (Signed)
Pt was seen today for her preop appointment for a scheduled repeat cesarean section on 8-13. Reported having contractions and sent to MAU for evaluation. No bleeding or leaking and reports good fetal movement.

## 2011-06-01 ENCOUNTER — Encounter (HOSPITAL_COMMUNITY): Payer: Self-pay | Admitting: Anesthesiology

## 2011-06-01 ENCOUNTER — Encounter (HOSPITAL_COMMUNITY): Payer: Self-pay | Admitting: Obstetrics

## 2011-06-01 ENCOUNTER — Inpatient Hospital Stay (HOSPITAL_COMMUNITY)
Admit: 2011-06-01 | Discharge: 2011-06-04 | DRG: 766 | Disposition: A | Discharge status: Home / Self Care | Payer: Medicaid Other | Source: Ambulatory Visit | Attending: Obstetrics | Admitting: Obstetrics

## 2011-06-01 ENCOUNTER — Inpatient Hospital Stay (HOSPITAL_COMMUNITY): Payer: Medicaid Other | Admitting: Anesthesiology

## 2011-06-01 ENCOUNTER — Encounter (HOSPITAL_COMMUNITY)
Admission: RE | Disposition: A | Discharge status: Home / Self Care | Payer: Self-pay | Source: Ambulatory Visit | Attending: Obstetrics

## 2011-06-01 ENCOUNTER — Other Ambulatory Visit: Payer: Self-pay | Admitting: Obstetrics

## 2011-06-01 DIAGNOSIS — E282 Polycystic ovarian syndrome: Secondary | ICD-10-CM

## 2011-06-01 DIAGNOSIS — O24410 Gestational diabetes mellitus in pregnancy, diet controlled: Secondary | ICD-10-CM

## 2011-06-01 DIAGNOSIS — Z01818 Encounter for other preprocedural examination: Secondary | ICD-10-CM

## 2011-06-01 DIAGNOSIS — Z01812 Encounter for preprocedural laboratory examination: Secondary | ICD-10-CM

## 2011-06-01 DIAGNOSIS — O99814 Abnormal glucose complicating childbirth: Secondary | ICD-10-CM | POA: Diagnosis present

## 2011-06-01 DIAGNOSIS — Z302 Encounter for sterilization: Secondary | ICD-10-CM

## 2011-06-01 DIAGNOSIS — F17200 Nicotine dependence, unspecified, uncomplicated: Secondary | ICD-10-CM

## 2011-06-01 DIAGNOSIS — O34219 Maternal care for unspecified type scar from previous cesarean delivery: Principal | ICD-10-CM | POA: Diagnosis present

## 2011-06-01 DIAGNOSIS — O4700 False labor before 37 completed weeks of gestation, unspecified trimester: Secondary | ICD-10-CM

## 2011-06-01 DIAGNOSIS — Z98891 History of uterine scar from previous surgery: Secondary | ICD-10-CM

## 2011-06-01 DIAGNOSIS — E669 Obesity, unspecified: Secondary | ICD-10-CM

## 2011-06-01 DIAGNOSIS — F339 Major depressive disorder, recurrent, unspecified: Secondary | ICD-10-CM

## 2011-06-01 LAB — GLUCOSE, CAPILLARY: Glucose-Capillary: 86 mg/dL (ref 70–99)

## 2011-06-01 LAB — TYPE AND SCREEN: ABO/RH(D): O POS

## 2011-06-01 SURGERY — Surgical Case
Anesthesia: Spinal | Site: Uterus | Laterality: Bilateral | Wound class: Clean Contaminated

## 2011-06-01 MED ORDER — PHENYLEPHRINE 40 MCG/ML (10ML) SYRINGE FOR IV PUSH (FOR BLOOD PRESSURE SUPPORT)
PREFILLED_SYRINGE | INTRAVENOUS | Status: AC
  Filled 2011-06-01: qty 5

## 2011-06-01 MED ORDER — KETOROLAC TROMETHAMINE 30 MG/ML IJ SOLN: 30.0000 mg | Freq: Four times a day (QID) | INTRAMUSCULAR | Status: AC | PRN

## 2011-06-01 MED ORDER — SENNOSIDES-DOCUSATE SODIUM 8.6-50 MG PO TABS
2.0000 | ORAL_TABLET | Freq: Every day | ORAL | Status: DC
  Administered 2011-06-01 – 2011-06-03 (×3): 2 via ORAL

## 2011-06-01 MED ORDER — MEPERIDINE HCL 25 MG/ML IJ SOLN: 6.2500 mg | INTRAMUSCULAR | Status: DC | PRN

## 2011-06-01 MED ORDER — ONDANSETRON HCL 4 MG/2ML IJ SOLN
INTRAMUSCULAR | Status: AC
  Filled 2011-06-01: qty 2

## 2011-06-01 MED ORDER — SODIUM CHLORIDE 0.9 % IJ SOLN: 3.0000 mL | INTRAMUSCULAR | Status: DC | PRN

## 2011-06-01 MED ORDER — BUPIVACAINE HCL 0.75 % IJ SOLN
INTRAMUSCULAR | Status: DC | PRN
  Administered 2011-06-01: 1.6 mL

## 2011-06-01 MED ORDER — OXYTOCIN 10 UNIT/ML IJ SOLN
INTRAMUSCULAR | Status: AC
  Filled 2011-06-01: qty 4

## 2011-06-01 MED ORDER — PHENYLEPHRINE 40 MCG/ML (10ML) SYRINGE FOR IV PUSH (FOR BLOOD PRESSURE SUPPORT)
PREFILLED_SYRINGE | INTRAVENOUS | Status: AC
  Filled 2011-06-01: qty 10

## 2011-06-01 MED ORDER — LACTATED RINGERS IV SOLN
INTRAVENOUS | Status: DC
Start: 2011-06-01 — End: 2011-06-01
  Administered 2011-06-01 (×4): via INTRAVENOUS

## 2011-06-01 MED ORDER — OXYTOCIN 10 UNIT/ML IJ SOLN: INTRAMUSCULAR | Status: DC | PRN

## 2011-06-01 MED ORDER — ONDANSETRON HCL 4 MG/2ML IJ SOLN
INTRAMUSCULAR | Status: DC | PRN
  Administered 2011-06-01: 4 mg via INTRAVENOUS

## 2011-06-01 MED ORDER — IBUPROFEN 600 MG PO TABS
600.0000 mg | ORAL_TABLET | Freq: Four times a day (QID) | ORAL | Status: DC
  Administered 2011-06-01 – 2011-06-04 (×11): 600 mg via ORAL
  Filled 2011-06-01 (×3): qty 1

## 2011-06-01 MED ORDER — HYDROCODONE-ACETAMINOPHEN 5-325 MG PO TABS: 1.0000 | ORAL_TABLET | Freq: Four times a day (QID) | ORAL | Status: DC | PRN

## 2011-06-01 MED ORDER — METHYLERGONOVINE MALEATE 0.2 MG PO TABS: 0.2000 mg | ORAL_TABLET | ORAL | Status: DC | PRN

## 2011-06-01 MED ORDER — SCOPOLAMINE 1 MG/3DAYS TD PT72
1.0000 | MEDICATED_PATCH | Freq: Once | TRANSDERMAL | Status: DC
  Filled 2011-06-01: qty 1

## 2011-06-01 MED ORDER — FENTANYL CITRATE 0.05 MG/ML IJ SOLN
INTRAMUSCULAR | Status: AC
  Filled 2011-06-01: qty 2

## 2011-06-01 MED ORDER — METOCLOPRAMIDE HCL 5 MG/ML IJ SOLN: 10.0000 mg | Freq: Three times a day (TID) | INTRAMUSCULAR | Status: DC | PRN

## 2011-06-01 MED ORDER — MORPHINE SULFATE 0.5 MG/ML IJ SOLN
INTRAMUSCULAR | Status: AC
  Filled 2011-06-01: qty 10

## 2011-06-01 MED ORDER — WITCH HAZEL-GLYCERIN EX PADS: 1.0000 "application " | MEDICATED_PAD | CUTANEOUS | Status: DC | PRN

## 2011-06-01 MED ORDER — OXYCODONE-ACETAMINOPHEN 5-325 MG PO TABS
1.0000 | ORAL_TABLET | ORAL | Status: DC | PRN
Start: 2011-06-01 — End: 2011-06-04
  Administered 2011-06-02: 2 via ORAL
  Administered 2011-06-02: 1 via ORAL
  Administered 2011-06-02 – 2011-06-03 (×4): 2 via ORAL
  Administered 2011-06-03: 1 via ORAL
  Administered 2011-06-03 – 2011-06-04 (×4): 2 via ORAL
  Filled 2011-06-01 (×3): qty 2
  Filled 2011-06-01: qty 1
  Filled 2011-06-01 (×4): qty 2
  Filled 2011-06-01: qty 1
  Filled 2011-06-01 (×2): qty 2

## 2011-06-01 MED ORDER — SCOPOLAMINE 1 MG/3DAYS TD PT72
MEDICATED_PATCH | TRANSDERMAL | Status: AC
  Administered 2011-06-01: 1.5 mg
  Filled 2011-06-01: qty 1

## 2011-06-01 MED ORDER — PHENYLEPHRINE HCL 10 MG/ML IJ SOLN
INTRAMUSCULAR | Status: DC | PRN
  Administered 2011-06-01: 120 ug via INTRAVENOUS
  Administered 2011-06-01 (×2): 80 ug via INTRAVENOUS
  Administered 2011-06-01: 40 ug via INTRAVENOUS
  Administered 2011-06-01: 120 ug via INTRAVENOUS
  Administered 2011-06-01 (×4): 80 ug via INTRAVENOUS

## 2011-06-01 MED ORDER — PRENATAL PLUS 27-1 MG PO TABS
1.0000 | ORAL_TABLET | Freq: Every day | ORAL | Status: DC
  Administered 2011-06-02 – 2011-06-04 (×3): 1 via ORAL
  Filled 2011-06-01 (×3): qty 1

## 2011-06-01 MED ORDER — CEFAZOLIN SODIUM 1-5 GM-% IV SOLN
INTRAVENOUS | Status: DC | PRN
  Administered 2011-06-01: 2 g via INTRAVENOUS

## 2011-06-01 MED ORDER — FLUOCINONIDE 0.05 % EX CREA
1.0000 "application " | TOPICAL_CREAM | Freq: Two times a day (BID) | CUTANEOUS | Status: DC | PRN
  Filled 2011-06-01: qty 30

## 2011-06-01 MED ORDER — MORPHINE SULFATE (PF) 0.5 MG/ML IJ SOLN
INTRAMUSCULAR | Status: DC | PRN
  Administered 2011-06-01: .1 mg via EPIDURAL

## 2011-06-01 MED ORDER — SODIUM CHLORIDE 0.9 % IV SOLN
1.0000 ug/kg/h | INTRAVENOUS | Status: DC | PRN
  Filled 2011-06-01: qty 2.5

## 2011-06-01 MED ORDER — OXYTOCIN 20 UNITS IN LACTATED RINGERS INFUSION - SIMPLE: 125.0000 mL/h | INTRAVENOUS | Status: AC

## 2011-06-01 MED ORDER — HYDROMORPHONE HCL 1 MG/ML IJ SOLN: 1.0000 mg | Freq: Once | INTRAMUSCULAR | Status: DC

## 2011-06-01 MED ORDER — NIFEDIPINE ER 30 MG PO TB24
30.0000 mg | ORAL_TABLET | Freq: Two times a day (BID) | ORAL | Status: DC
  Filled 2011-06-01 (×3): qty 1

## 2011-06-01 MED ORDER — LACTATED RINGERS IV SOLN
INTRAVENOUS | Status: AC
  Administered 2011-06-01 (×2): via INTRAVENOUS

## 2011-06-01 MED ORDER — CEFAZOLIN SODIUM 1-5 GM-% IV SOLN
INTRAVENOUS | Status: AC
  Filled 2011-06-01: qty 100

## 2011-06-01 MED ORDER — DIBUCAINE 1 % RE OINT: 1.0000 "application " | TOPICAL_OINTMENT | RECTAL | Status: DC | PRN

## 2011-06-01 MED ORDER — NALOXONE HCL 0.4 MG/ML IJ SOLN: 0.4000 mg | INTRAMUSCULAR | Status: DC | PRN

## 2011-06-01 MED ORDER — LANOLIN HYDROUS EX OINT: 1.0000 "application " | TOPICAL_OINTMENT | CUTANEOUS | Status: DC | PRN

## 2011-06-01 MED ORDER — SIMETHICONE 80 MG PO CHEW: 80.0000 mg | CHEWABLE_TABLET | ORAL | Status: DC | PRN

## 2011-06-01 MED ORDER — ONDANSETRON HCL 4 MG PO TABS: 4.0000 mg | ORAL_TABLET | ORAL | Status: DC | PRN

## 2011-06-01 MED ORDER — ACETAMINOPHEN 500 MG PO TABS
1000.0000 mg | ORAL_TABLET | Freq: Four times a day (QID) | ORAL | Status: DC | PRN
  Filled 2011-06-01: qty 2

## 2011-06-01 MED ORDER — MEASLES, MUMPS & RUBELLA VAC ~~LOC~~ INJ
0.5000 mL | INJECTION | Freq: Once | SUBCUTANEOUS | Status: DC
  Filled 2011-06-01: qty 0.5

## 2011-06-01 MED ORDER — TETANUS-DIPHTH-ACELL PERTUSSIS 5-2.5-18.5 LF-MCG/0.5 IM SUSP
0.5000 mL | Freq: Once | INTRAMUSCULAR | Status: AC
  Administered 2011-06-04: 0.5 mL via INTRAMUSCULAR
  Filled 2011-06-01: qty 0.5

## 2011-06-01 MED ORDER — NALBUPHINE HCL 10 MG/ML IJ SOLN
5.0000 mg | INTRAMUSCULAR | Status: DC | PRN
  Administered 2011-06-01 – 2011-06-02 (×3): 5 mg via SUBCUTANEOUS
  Filled 2011-06-01: qty 1

## 2011-06-01 MED ORDER — FENTANYL CITRATE 0.05 MG/ML IJ SOLN
INTRAMUSCULAR | Status: DC | PRN
  Administered 2011-06-01: 20 ug via INTRATHECAL
  Administered 2011-06-01: 80 ug via INTRAVENOUS

## 2011-06-01 MED ORDER — SIMETHICONE 80 MG PO CHEW
80.0000 mg | CHEWABLE_TABLET | Freq: Three times a day (TID) | ORAL | Status: DC
  Administered 2011-06-01 – 2011-06-04 (×10): 80 mg via ORAL

## 2011-06-01 MED ORDER — EPHEDRINE 5 MG/ML INJ
INTRAVENOUS | Status: AC
  Filled 2011-06-01: qty 10

## 2011-06-01 MED ORDER — ONDANSETRON HCL 4 MG/2ML IJ SOLN: 4.0000 mg | Freq: Once | INTRAMUSCULAR | Status: DC | PRN

## 2011-06-01 MED ORDER — NALBUPHINE HCL 10 MG/ML IJ SOLN
5.0000 mg | INTRAMUSCULAR | Status: DC | PRN
  Filled 2011-06-01: qty 1

## 2011-06-01 MED ORDER — METHYLERGONOVINE MALEATE 0.2 MG/ML IJ SOLN: 0.2000 mg | INTRAMUSCULAR | Status: DC | PRN

## 2011-06-01 MED ORDER — KETOROLAC TROMETHAMINE 60 MG/2ML IM SOLN
60.0000 mg | Freq: Once | INTRAMUSCULAR | Status: AC | PRN
  Administered 2011-06-01: 60 mg via INTRAMUSCULAR

## 2011-06-01 MED ORDER — IBUPROFEN 400 MG PO TABS
200.0000 mg | ORAL_TABLET | Freq: Four times a day (QID) | ORAL | Status: DC | PRN
  Filled 2011-06-01 (×8): qty 1

## 2011-06-01 MED ORDER — DIPHENHYDRAMINE HCL 50 MG/ML IJ SOLN
12.5000 mg | INTRAMUSCULAR | Status: DC | PRN
  Administered 2011-06-01: 12.5 mg via INTRAVENOUS
  Filled 2011-06-01: qty 1

## 2011-06-01 MED ORDER — ONDANSETRON 8 MG PO TBDP
8.0000 mg | ORAL_TABLET | Freq: Two times a day (BID) | ORAL | Status: DC | PRN
Start: 2011-06-01 — End: 2011-06-04
  Filled 2011-06-01: qty 1

## 2011-06-01 MED ORDER — OXYTOCIN 20 UNITS IN LACTATED RINGERS INFUSION - SIMPLE
INTRAVENOUS | Status: DC | PRN
  Administered 2011-06-01 (×2): 20 [IU] via INTRAVENOUS

## 2011-06-01 MED ORDER — DOCUSATE SODIUM 100 MG PO CAPS: 200.0000 mg | ORAL_CAPSULE | Freq: Every day | ORAL | Status: DC | PRN

## 2011-06-01 MED ORDER — MENTHOL 3 MG MT LOZG: 1.0000 | LOZENGE | OROMUCOSAL | Status: DC | PRN

## 2011-06-01 MED ORDER — DIPHENHYDRAMINE HCL 50 MG/ML IJ SOLN: 25.0000 mg | INTRAMUSCULAR | Status: DC | PRN

## 2011-06-01 MED ORDER — ZOLPIDEM TARTRATE 5 MG PO TABS: 5.0000 mg | ORAL_TABLET | Freq: Every evening | ORAL | Status: DC | PRN

## 2011-06-01 MED ORDER — ONDANSETRON HCL 4 MG/2ML IJ SOLN: 4.0000 mg | Freq: Three times a day (TID) | INTRAMUSCULAR | Status: DC | PRN

## 2011-06-01 MED ORDER — KETOROLAC TROMETHAMINE 60 MG/2ML IM SOLN
INTRAMUSCULAR | Status: AC
  Administered 2011-06-01: 60 mg via INTRAMUSCULAR
  Filled 2011-06-01: qty 2

## 2011-06-01 MED ORDER — DIPHENHYDRAMINE HCL 25 MG PO CAPS
25.0000 mg | ORAL_CAPSULE | ORAL | Status: DC | PRN
  Administered 2011-06-02 (×3): 25 mg via ORAL
  Filled 2011-06-01 (×3): qty 1

## 2011-06-01 MED ORDER — EPHEDRINE SULFATE 50 MG/ML IJ SOLN
INTRAMUSCULAR | Status: DC | PRN
  Administered 2011-06-01: 15 mg via INTRAVENOUS
  Administered 2011-06-01: 10 mg via INTRAVENOUS
  Administered 2011-06-01: 40 mg via INTRAVENOUS
  Administered 2011-06-01 (×2): 10 mg via INTRAVENOUS
  Administered 2011-06-01: 5 mg via INTRAVENOUS

## 2011-06-01 MED ORDER — HYDROMORPHONE HCL 1 MG/ML IJ SOLN
INTRAMUSCULAR | Status: AC
  Administered 2011-06-01: 1 mg via INTRAVENOUS
  Filled 2011-06-01: qty 1

## 2011-06-01 SURGICAL SUPPLY — 35 items
CHLORAPREP W/TINT 26ML (MISCELLANEOUS) ×2 IMPLANT
CLOTH BEACON ORANGE TIMEOUT ST (SAFETY) ×2 IMPLANT
CONTAINER PREFILL 10% NBF 15ML (MISCELLANEOUS) ×4 IMPLANT
DERMABOND ADVANCED (GAUZE/BANDAGES/DRESSINGS) ×1
DERMABOND ADVANCED .7 DNX12 (GAUZE/BANDAGES/DRESSINGS) ×1 IMPLANT
DRSG COVADERM 4X8 (GAUZE/BANDAGES/DRESSINGS) ×2 IMPLANT
ELECT REM PT RETURN 9FT ADLT (ELECTROSURGICAL) ×2
ELECTRODE REM PT RTRN 9FT ADLT (ELECTROSURGICAL) ×1 IMPLANT
EXTRACTOR VACUUM M CUP 4 TUBE (SUCTIONS) IMPLANT
GLOVE BIO SURGEON STRL SZ8 (GLOVE) ×4 IMPLANT
GLOVE SS N UNI LF 6.0 STRL (GLOVE) ×2 IMPLANT
GLOVE SS N UNI LF 6.5 STRL (GLOVE) ×2 IMPLANT
GLOVE SS N UNI LF 8.0 STRL (GLOVE) ×6 IMPLANT
GLOVE SS N UNI LF 8.5 STRL (GLOVE) ×2 IMPLANT
GOWN PREVENTION PLUS LG XLONG (DISPOSABLE) ×4 IMPLANT
GOWN STRL REIN 2XL LVL4 (GOWN DISPOSABLE) ×4 IMPLANT
NS IRRIG 1000ML POUR BTL (IV SOLUTION) ×2 IMPLANT
PACK C SECTION WH (CUSTOM PROCEDURE TRAY) ×2 IMPLANT
RTRCTR C-SECT PINK 25CM LRG (MISCELLANEOUS) IMPLANT
STAPLER VISISTAT 35W (STAPLE) IMPLANT
SUT GUT PLAIN 0 CT-3 TAN 27 (SUTURE) ×2 IMPLANT
SUT MNCRL 0 VIOLET CTX 36 (SUTURE) ×3 IMPLANT
SUT MNCRL AB 4-0 PS2 18 (SUTURE) ×2 IMPLANT
SUT MON AB 2-0 CT1 27 (SUTURE) ×2 IMPLANT
SUT MON AB 3-0 SH 27 (SUTURE)
SUT MON AB 3-0 SH27 (SUTURE) IMPLANT
SUT MONOCRYL 0 CTX 36 (SUTURE) ×3
SUT PLAIN 2 0 XLH (SUTURE) IMPLANT
SUT VIC AB 0 CTX 36 (SUTURE) ×2
SUT VIC AB 0 CTX36XBRD ANBCTRL (SUTURE) ×2 IMPLANT
SUT VIC AB 2-0 CT1 27 (SUTURE)
SUT VIC AB 2-0 CT1 TAPERPNT 27 (SUTURE) IMPLANT
TOWEL OR 17X24 6PK STRL BLUE (TOWEL DISPOSABLE) ×4 IMPLANT
TRAY FOLEY CATH 14FR (SET/KITS/TRAYS/PACK) ×2 IMPLANT
WATER STERILE IRR 1000ML POUR (IV SOLUTION) ×2 IMPLANT

## 2011-06-01 NOTE — Transfer of Care (Signed)
Immediate Anesthesia Transfer of Care Note  Patient: Stacie Carrillo  Procedure(s) Performed:  CESAREAN SECTION WITH BILATERAL TUBAL LIGATION  Patient Location: PACU  Anesthesia Type: Regional and Spinal  Level of Consciousness: awake, alert  and oriented  Airway & Oxygen Therapy: Patient Spontanous Breathing  Post-op Assessment: Report given to PACU RN and Post -op Vital signs reviewed and stable  Post vital signs: Reviewed and stable  Complications: No apparent anesthesia complications

## 2011-06-01 NOTE — H&P (Signed)
Stacie Carrillo is an 30 y.o. female. Presents for repeat C/S and BTL.  Pertinent Gynecological History: Menses: n/a Bleeding: n/a Contraception: none DES exposure: denies Blood transfusions: none Sexually transmitted diseases: no past history Previous GYN Procedures: n/a  Last mammogram: n/a Date: 2012 Last pap: normal Date: 2012. OB History: G32, P2   Menstrual History: Menarche age: 74 Patient's last menstrual period was 08/31/2010.    Past Medical History  Diagnosis Date  . Depression   . Polycystic ovarian syndrome   . Obesity   . Gestational diabetes mellitus, diet-controlled 04/26/2011  . Gestational diabetes     Past Surgical History  Procedure Date  . Cesarean section     x3    Family History  Problem Relation Age of Onset  . Diabetes Maternal Grandfather     Social History:  reports that she has been smoking Cigarettes.  She has a 4.75 pack-year smoking history. She does not have any smokeless tobacco history on file. She reports that she does not drink alcohol or use illicit drugs.  Allergies:  Allergies  Allergen Reactions  . Latex Other (See Comments)    Pt reports "eczema" with latex use    Prescriptions prior to admission  Medication Sig Dispense Refill  . acetaminophen (TYLENOL) 500 MG tablet Take 1,000 mg by mouth daily as needed. For headache        . docusate sodium (COLACE) 100 MG capsule Take 200 mg by mouth daily as needed. For constipation        . fluocinonide (LIDEX) 0.05 % cream Apply 1 application topically 2 (two) times daily as needed. For eczema      . HYDROcodone-acetaminophen (LORTAB) 7.5-500 MG per tablet Take 0.5 tablets by mouth every 6 (six) hours as needed. For headache        . Multiple Vitamins-Minerals (EQL GUMMY ADULT PO) Take 1 tablet by mouth daily. Prenatal gummy multivitamin       . NIFEdipine (PROCARDIA XL/ADALAT-CC) 30 MG 24 hr tablet Take 30 mg by mouth 2 (two) times daily.        . ondansetron (ZOFRAN-ODT) 8  MG disintegrating tablet Take 8 mg by mouth every 12 (twelve) hours as needed. For nausea and vomiting      . ranitidine (ZANTAC) 150 MG tablet Take 150 mg by mouth 3 (three) times daily as needed. For heartburn       . zolpidem (AMBIEN) 10 MG tablet Take 10 mg by mouth at bedtime as needed. For sleep        . NIFEDIPINE PO Take 1 tablet by mouth 2 (two) times daily. Unknown strength (unable to verify with pharmacies)      . omeprazole (PRILOSEC) 20 MG capsule Take 20 mg by mouth daily. For heartburn       . prenatal vitamin w/FE, FA (PRENATAL 1 + 1) 27-1 MG TABS Take 1 tablet by mouth daily.          Review of Systems  All other systems reviewed and are negative.    Blood pressure 124/84, pulse 100, temperature 98.1 F (36.7 C), temperature source Oral, resp. rate 18, last menstrual period 08/31/2010, SpO2 100.00%. Physical Exam  Constitutional: She is oriented to person, place, and time. She appears well-developed and well-nourished.  HENT:  Head: Normocephalic and atraumatic.  Eyes: Conjunctivae and EOM are normal. Pupils are equal, round, and reactive to light.  Neck: Normal range of motion. Neck supple.  Cardiovascular: Normal rate and regular rhythm.  Respiratory: Effort normal and breath sounds normal.  GI: Soft. Bowel sounds are normal.  Genitourinary: Vagina normal and uterus normal.  Musculoskeletal: Normal range of motion.  Neurological: She is alert and oriented to person, place, and time. She has normal reflexes.  Skin: Skin is warm and dry.  Psychiatric: She has a normal mood and affect. Her behavior is normal. Judgment and thought content normal.    Results for orders placed during the hospital encounter of 06/01/11 (from the past 24 hour(s))  TYPE AND SCREEN     Status: Normal   Collection Time   06/01/11  6:25 AM      Component Value Range   ABO/RH(D) O POS     Antibody Screen NEG     Sample Expiration 06/04/2011    GLUCOSE, CAPILLARY     Status: Normal    Collection Time   06/01/11  7:13 AM      Component Value Range   Glucose-Capillary 86  70 - 99 (mg/dL)    No results found.  Assessment/Plan: Previous C/S x 2.  39weeks.  Desires repeat C/S and BTL.  Stacie Carrillo A 06/01/2011, 7:36 AM

## 2011-06-01 NOTE — Anesthesia Procedure Notes (Addendum)
Spinal Block  Patient location during procedure: OR Start time: 06/01/2011 7:30 AM End time: 06/01/2011 7:36 AM Staffing Anesthesiologist: Sandrea Hughs Performed by: anesthesiologist  Preanesthetic Checklist Completed: patient identified, site marked, surgical consent, pre-op evaluation, timeout performed, IV checked, risks and benefits discussed and monitors and equipment checked Spinal Block Patient position: sitting Prep: DuraPrep Patient monitoring: heart rate, cardiac monitor, continuous pulse ox and blood pressure Approach: midline Location: L3-4 Injection technique: single-shot Needle Needle type: Sprotte  Needle gauge: 24 G Needle length: 9 cm Needle insertion depth: 9 cm Assessment Sensory level: T4 Events: paresthesia Additional Notes Pt tolerated procedure well. No complications. Transient right sided paresthesia.

## 2011-06-01 NOTE — Anesthesia Postprocedure Evaluation (Signed)
  Anesthesia Post Note  Patient: Stacie Carrillo  Procedure(s) Performed:  CESAREAN SECTION WITH BILATERAL TUBAL LIGATION  Anesthesia type: Spinal  Patient location: PACU  Post pain: Pain level controlled  Post assessment: Post-op Vital signs reviewed  Last Vitals:  Filed Vitals:   06/01/11 0844  BP: 124/65  Pulse: 69  Temp: 97.6 F (36.4 C)  Resp: 16    Post vital signs: Reviewed  Level of consciousness: awake  Complications: No apparent anesthesia complications

## 2011-06-01 NOTE — Progress Notes (Signed)
UR chart review completed.  

## 2011-06-02 LAB — CBC
MCH: 30.3 pg (ref 26.0–34.0)
MCV: 88 fL (ref 78.0–100.0)
Platelets: 161 10*3/uL (ref 150–400)
RBC: 3.5 MIL/uL — ABNORMAL LOW (ref 3.87–5.11)
RDW: 13.8 % (ref 11.5–15.5)

## 2011-06-02 LAB — GLUCOSE, CAPILLARY: Glucose-Capillary: 97 mg/dL (ref 70–99)

## 2011-06-02 NOTE — Progress Notes (Signed)
  Subjective: POD# 1 s/p Cesarean Delivery.  Indications: Elective  RH status/Rubella reviewed. Feeding: breast Patient reports tolerating PO.  Denies HA/SOB/C/P/N/V/dizziness.  Reports flatus or BM. Breast symptoms:none  She reports vaginal bleeding as normal, without clots.  She is ambulating, urinating without difficulty.     Objective: Vital signs in last 24 hours: BP 116/80  Pulse 94  Temp(Src) 97.9 F (36.6 C) (Oral)  Resp 20  Wt 100.699 kg (222 lb)  SpO2 97%  LMP 08/31/2010  Breastfeeding? Unknown       Physical Exam:  General: alert and no distress CV: Regular rate and rhythm Resp: clear Abdomen: soft, nontender, normal bowel sounds Lochia: moderate Uterine Fundus: firm, below umbilicus, nontender Incision: clean, dry and intact Ext: extremities normal, atraumatic, no cyanosis or edema    Basename 06/02/11 0540  HGB 10.6*  HCT 30.8*      Assessment/Plan: 30 y.o.  status post Cesarean section. POD# 1.   Doing well, stable.              Advance diet as tolerated Start po pain meds D/C foley  HLIV  Ambulate IS Routine post-op care  HARPER,CHARLES A 06/02/2011, 8:42 AM

## 2011-06-03 MED ORDER — BISACODYL 10 MG RE SUPP: 10.0000 mg | Freq: Every day | RECTAL | Status: DC | PRN

## 2011-06-03 MED ORDER — FAMOTIDINE 20 MG PO TABS: 20.0000 mg | ORAL_TABLET | Freq: Two times a day (BID) | ORAL | Status: DC | PRN

## 2011-06-03 MED ORDER — RANITIDINE HCL 150 MG/10ML PO SYRP
150.0000 mg | ORAL_SOLUTION | Freq: Two times a day (BID) | ORAL | Status: DC | PRN
  Filled 2011-06-03: qty 10

## 2011-06-03 MED ORDER — RANITIDINE HCL 150 MG/10ML PO SYRP: 150.0000 mg | ORAL_SOLUTION | Freq: Two times a day (BID) | ORAL | Status: DC | PRN

## 2011-06-03 NOTE — Progress Notes (Signed)
Subjective: Postpartum Day 2: Cesarean Delivery Patient reports incisional pain, tolerating PO, + flatus, + BM and no problems voiding.    Objective: Vital signs in last 24 hours: Temp:  [97.9 F (36.6 C)-98.8 F (37.1 C)] 98.8 F (37.1 C) (08/15 0505) Pulse Rate:  [91-106] 99  (08/15 0505) Resp:  [17-18] 18  (08/15 0505) BP: (99-103)/(64-68) 99/64 mmHg (08/15 0505)  Physical Exam:  General: alert and no distress Lochia: appropriate Uterine Fundus: firm Incision: healing well DVT Evaluation: No evidence of DVT seen on physical exam.   Basename 06/02/11 0540  HGB 10.6*  HCT 30.8*    Assessment/Plan: Status post Cesarean section. Doing well postoperatively.  Continue current care.  HARPER,CHARLES A 06/03/2011, 9:40 AM

## 2011-06-04 MED ORDER — IBUPROFEN 800 MG PO TABS: 800.0000 mg | ORAL_TABLET | Freq: Three times a day (TID) | ORAL | Status: AC | PRN

## 2011-06-04 MED ORDER — OXYCODONE-ACETAMINOPHEN 5-325 MG PO TABS: 2.0000 | ORAL_TABLET | Freq: Four times a day (QID) | ORAL | Status: AC | PRN

## 2011-06-04 NOTE — Progress Notes (Signed)
  Subjective: POD #3 s/p LTC/S  Indication:elective Patient reports tolerating PO.  Denies HA/SOB/C/P/N/V/dizziness.  Reports flatus or BM. Breast symptoms:none.  She reports vaginal bleeding as normal, without clots.  She is ambulating, urinating without difficulty.     Objective: Vital signs in last 24 hours: BP 123/81  Pulse 91  Temp(Src) 98.4 F (36.9 C) (Oral)  Resp 18  Wt 100.699 kg (222 lb)  SpO2 99%  LMP 08/31/2010  Breastfeeding? Unknown  Physical Exam:  General: alert CV: Regular rate and rhythm Resp: clear Abdomen: soft, nontender, normal bowel sounds Lochia: none Uterine Fundus: firm, below umbilicus, nontender Incision: clean, dry and intact Ext: extremities normal, atraumatic, no cyanosis or edema  Basename 06/02/11 0540  HGB 10.6*  HCT 30.8*    Assessment/Plan: 30 y.o. status post Cesarean section POD# 3.  normal postpartum exam normal post-operative exam  Routine post-op care D/C home  JACKSON-MOORE,Tayla Panozzo A 06/04/2011, 9:06 AM

## 2011-06-04 NOTE — Discharge Summary (Signed)
Obstetric Discharge Summary Reason for Admission: cesarean section Prenatal Procedures: none Intrapartum Procedures: cesarean: low cervical, transverse Postpartum Procedures: none Complications-Operative and Postpartum: none Hemoglobin  Date Value Range Status  06/02/2011 10.6* 12.0-15.0 (g/dL) Final     HCT  Date Value Range Status  06/02/2011 30.8* 36.0-46.0 (%) Final    Discharge Diagnoses: Term Pregnancy-delivered  Discharge Information: Date: 06/04/2011 Activity: pelvic rest Diet: routine Medications: Ibuprophen and Percocet Condition: stable Instructions: See above Discharge to: home Follow-up Information    Follow up with HARPER,CHARLES A, MD. Call in 2 weeks.   Contact information:   [address] [place] [address] [phone]          Newborn Data: Live born female  Birth Weight: 7 lb 3.2 oz (3266 g) APGAR: 8, 9  Home with mother.  Stacie Carrillo,Stacie Carrillo 06/04/2011, 9:07 AM

## 2011-06-05 ENCOUNTER — Inpatient Hospital Stay (HOSPITAL_COMMUNITY)
Admission: AD | Admit: 2011-06-05 | Discharge: 2011-06-05 | Disposition: A | Discharge status: Home / Self Care | Payer: Medicaid Other | Source: Ambulatory Visit | Attending: Obstetrics & Gynecology | Admitting: Obstetrics & Gynecology

## 2011-06-05 DIAGNOSIS — T8130XA Disruption of wound, unspecified, initial encounter: Secondary | ICD-10-CM

## 2011-06-05 DIAGNOSIS — O909 Complication of the puerperium, unspecified: Secondary | ICD-10-CM | POA: Insufficient documentation

## 2011-06-05 DIAGNOSIS — T8131XA Disruption of external operation (surgical) wound, not elsewhere classified, initial encounter: Secondary | ICD-10-CM

## 2011-06-05 MED ORDER — KETOROLAC TROMETHAMINE 60 MG/2ML IM SOLN: 60.0000 mg | Freq: Once | INTRAMUSCULAR | Status: DC

## 2011-06-05 NOTE — Progress Notes (Signed)
Patient states she had a repeat cesarean section on 8-13. Was coughing and trying to get up this am and feel like she opened her incision. Has had some leaking of blood. No pain.

## 2011-06-05 NOTE — ED Provider Notes (Signed)
History   Pt presents today stating that her incision "opened." She states she had a C/Section on Monday of this week. This morning she coughed and felt a big "gush." She noticed at that time that her incision was open so she presented to the MAU. She denies fever or pain.  Chief Complaint  Patient presents with  . Wound Check   HPI  OB History    Grav Para Term Preterm Abortions TAB SAB Ect Mult Living   4 4 4  0 0 0 0 0 0 4      Past Medical History  Diagnosis Date  . Depression   . Polycystic ovarian syndrome   . Obesity   . Gestational diabetes mellitus, diet-controlled 04/26/2011  . Gestational diabetes     Past Surgical History  Procedure Date  . Cesarean section     x3    Family History  Problem Relation Age of Onset  . Diabetes Maternal Grandfather     History  Substance Use Topics  . Smoking status: Current Everyday Smoker -- 0.2 packs/day for 19 years    Types: Cigarettes  . Smokeless tobacco: Not on file  . Alcohol Use: No    Allergies:  Allergies  Allergen Reactions  . Latex Other (See Comments)    Pt reports "eczema" with latex use    Prescriptions prior to admission  Medication Sig Dispense Refill  . docusate sodium (COLACE) 100 MG capsule Take 200 mg by mouth daily as needed. For constipation        . fluocinonide (LIDEX) 0.05 % cream Apply 1 application topically 2 (two) times daily as needed. For eczema      . ibuprofen (ADVIL,MOTRIN) 800 MG tablet Take 1 tablet (800 mg total) by mouth every 8 (eight) hours as needed for pain.  30 tablet  0  . oxyCODONE-acetaminophen (PERCOCET) 5-325 MG per tablet Take 2 tablets by mouth every 6 (six) hours as needed for pain.  30 tablet  0  . prenatal vitamin w/FE, FA (PRENATAL 1 + 1) 27-1 MG TABS Take 1 tablet by mouth daily.          Review of Systems  Constitutional: Negative for fever and chills.  Cardiovascular: Negative for chest pain.  Gastrointestinal: Negative for nausea, vomiting, abdominal pain,  diarrhea and constipation.  Genitourinary: Negative for dysuria, urgency, frequency and hematuria.  Neurological: Negative for dizziness and headaches.  Psychiatric/Behavioral: Negative for depression and suicidal ideas.   Physical Exam   Blood pressure 132/83, pulse 91, temperature 98.6 F (37 C), temperature source Oral, resp. rate 20, height 5' 4.5" (1.638 m), weight 224 lb 9.6 oz (101.878 kg), last menstrual period 08/31/2010, unknown if currently breastfeeding.  Physical Exam  Constitutional: She is oriented to person, place, and time. She appears well-developed and well-nourished. No distress.  HENT:  Head: Normocephalic and atraumatic.  GI: Soft. She exhibits no distension. There is no tenderness. There is no rebound and no guarding.       Incision is open about 8cm on the left side. Underlying fascia is intact. No sign of infection. Wound was cleaned with H2O2.  Neurological: She is alert and oriented to person, place, and time.  Skin: Skin is warm and dry. She is not diaphoretic.  Psychiatric: She has a normal mood and affect. Her behavior is normal. Judgment and thought content normal.    MAU Course  Procedures  Discussed pt with Dr. Clearance Coots. Dr. Clearance Coots ordered wound care and a wound vac. She  will f/u in the office.  Assessment and Plan  Wound dehiscence: Home health will visit pt 3x week. Wound vac will be managed by the home health RN. Pt has f/u scheduled with Dr. Clearance Coots. Discussed diet, activity, risks, and precautions.  Clinton Gallant. Rice III, DrHSc, MPAS, PA-C  06/05/2011, 11:37 AM

## 2011-07-13 LAB — URINALYSIS, ROUTINE W REFLEX MICROSCOPIC
Bilirubin Urine: NEGATIVE
Ketones, ur: 15 — AB
Nitrite: NEGATIVE
Protein, ur: 100 — AB
Urobilinogen, UA: 1

## 2011-07-13 LAB — CBC
HCT: 42.7
Hemoglobin: 14.8
MCHC: 34.6
MCV: 88.2
RBC: 4.84

## 2011-07-13 LAB — DIFFERENTIAL
Basophils Relative: 0
Eosinophils Absolute: 0
Lymphs Abs: 1.1
Neutro Abs: 13.9 — ABNORMAL HIGH
Neutrophils Relative %: 84 — ABNORMAL HIGH

## 2011-07-13 LAB — COMPREHENSIVE METABOLIC PANEL
ALT: 13
BUN: 5 — ABNORMAL LOW
CO2: 20
Calcium: 9.2
GFR calc non Af Amer: >60
Glucose, Bld: 128 — ABNORMAL HIGH
Sodium: 139

## 2011-07-13 LAB — PREGNANCY, URINE: Preg Test, Ur: NEGATIVE

## 2011-07-13 LAB — URINE MICROSCOPIC-ADD ON

## 2011-07-30 LAB — CULTURE, ROUTINE-ABSCESS

## 2011-07-31 LAB — CBC
HCT: 39.3
MCHC: 34.9
MCV: 92.7
Platelets: 155
Platelets: 184
RBC: 3.41 — ABNORMAL LOW
RDW: 13.4
RDW: 13.5
WBC: 13.2 — ABNORMAL HIGH

## 2011-08-03 LAB — URINALYSIS, ROUTINE W REFLEX MICROSCOPIC
Bilirubin Urine: NEGATIVE
Glucose, UA: NEGATIVE
Ketones, ur: NEGATIVE
pH: 7

## 2011-08-03 LAB — CBC
HCT: 33.8 — ABNORMAL LOW
Hemoglobin: 11.9 — ABNORMAL LOW
MCHC: 34.5
MCV: 94.3
Platelets: 159
RBC: 3.64 — ABNORMAL LOW
RDW: 12.6
WBC: 13.8 — ABNORMAL HIGH

## 2011-08-03 LAB — GC/CHLAMYDIA PROBE AMP, GENITAL: GC Probe Amp, Genital: NEGATIVE

## 2011-08-03 LAB — DIFFERENTIAL
Eosinophils Relative: 1
Lymphocytes Relative: 19
Lymphs Abs: 2.6
Monocytes Absolute: 1.2 — ABNORMAL HIGH
Monocytes Relative: 9

## 2011-08-03 LAB — COMPREHENSIVE METABOLIC PANEL
AST: 13
Albumin: 2.5 — ABNORMAL LOW
Calcium: 8.8
Chloride: 110
Creatinine, Ser: 0.51
GFR calc Af Amer: >60

## 2011-08-03 LAB — TYPE AND SCREEN

## 2011-08-03 LAB — WET PREP, GENITAL: Yeast Wet Prep HPF POC: NONE SEEN

## 2011-08-03 LAB — STREP B DNA PROBE: Strep Group B Ag: POSITIVE

## 2011-08-03 LAB — ABO/RH: ABO/RH(D): O POS

## 2011-08-03 LAB — RAPID URINE DRUG SCREEN, HOSP PERFORMED
Cocaine: NOT DETECTED
Opiates: NOT DETECTED

## 2011-08-05 LAB — URINALYSIS, ROUTINE W REFLEX MICROSCOPIC
Bilirubin Urine: NEGATIVE
Glucose, UA: NEGATIVE
Hgb urine dipstick: NEGATIVE
Specific Gravity, Urine: 1.01
Urobilinogen, UA: 1

## 2011-11-03 ENCOUNTER — Encounter: Payer: Self-pay | Admitting: Gastroenterology

## 2011-11-11 ENCOUNTER — Ambulatory Visit: Payer: Medicaid Other | Admitting: Gastroenterology

## 2011-11-24 ENCOUNTER — Other Ambulatory Visit (INDEPENDENT_AMBULATORY_CARE_PROVIDER_SITE_OTHER): Payer: Medicaid Other

## 2011-11-24 ENCOUNTER — Ambulatory Visit (INDEPENDENT_AMBULATORY_CARE_PROVIDER_SITE_OTHER): Payer: Medicaid Other | Admitting: Gastroenterology

## 2011-11-24 ENCOUNTER — Encounter: Payer: Self-pay | Admitting: Gastroenterology

## 2011-11-24 VITALS — BP 106/86 | HR 100 | Ht 65.0 in | Wt 216.8 lb

## 2011-11-24 DIAGNOSIS — K219 Gastro-esophageal reflux disease without esophagitis: Secondary | ICD-10-CM

## 2011-11-24 DIAGNOSIS — F339 Major depressive disorder, recurrent, unspecified: Secondary | ICD-10-CM

## 2011-11-24 DIAGNOSIS — D649 Anemia, unspecified: Secondary | ICD-10-CM | POA: Insufficient documentation

## 2011-11-24 LAB — CBC WITH DIFFERENTIAL/PLATELET
Basophils Relative: 0.1 % (ref 0.0–3.0)
Eosinophils Absolute: 0.2 10*3/uL (ref 0.0–0.7)
HCT: 39.9 % (ref 36.0–46.0)
Hemoglobin: 13.4 g/dL (ref 12.0–15.0)
Lymphs Abs: 2.6 10*3/uL (ref 0.7–4.0)
MCHC: 33.5 g/dL (ref 30.0–36.0)
MCV: 89.5 fl (ref 78.0–100.0)
Monocytes Absolute: 0.7 10*3/uL (ref 0.1–1.0)
Neutro Abs: 5.2 10*3/uL (ref 1.4–7.7)
Neutrophils Relative %: 59.9 % (ref 43.0–77.0)
RBC: 4.46 Mil/uL (ref 3.87–5.11)

## 2011-11-24 NOTE — Assessment & Plan Note (Signed)
This may be related to menorrhagia.  Recommendations #1 check iron studies including iron and ferritin  #2 repeat CBC

## 2011-11-24 NOTE — Progress Notes (Signed)
History of Present Illness:  [name] is a 31 year old Afro-American female referred for evaluation of reflux. She has had reflux for many years. Symptoms have especially become severe over the past year. At this point she has immediate postprandial severe burning chest discomfort. She has taken various PPIs without much relief. She's also complaining of dysphagia. She is on no gastric irritants including nonsteroidals. She denies sore throat, coughing or hoarseness.  Lab work is pertinent for a hemoglobin of 10.6 in August, 2012. She admits to menorrhagia.    Past Medical History  Diagnosis Date  . Depression   . Polycystic ovarian syndrome   . Obesity   . Gestational diabetes   . Reflux    Past Surgical History  Procedure Date  . Cesarean section     x3  . Tubal ligation    family history includes Cirrhosis in her mother; Diabetes in her maternal grandfather; and Throat cancer in her maternal aunt. Current Outpatient Prescriptions  Medication Sig Dispense Refill  . fluocinonide (LIDEX) 0.05 % cream Apply 1 application topically 2 (two) times daily as needed. For eczema      . Pantoprazole Sodium (PROTONIX PO) Take 40 mg by mouth.        Allergies as of 11/24/2011 - Review Complete 11/24/2011  Allergen Reaction Noted  . Latex Other (See Comments) 04/26/2011    reports that she has been smoking Cigarettes.  She has a 4.75 pack-year smoking history. She has never used smokeless tobacco. She reports that she does not drink alcohol or use illicit drugs.     Review of Systems: Pertinent positive and negative review of systems were noted in the above HPI section. All other review of systems were otherwise negative.  Vital signs were reviewed in today's medical record Physical Exam: General: Well developed , well nourished, no acute distress Head: Normocephalic and atraumatic Eyes:  sclerae anicteric, EOMI Ears: Normal auditory acuity Mouth: No deformity or lesions Neck:  Supple, no masses or thyromegaly Lungs: Clear throughout to auscultation Heart: Regular rate and rhythm; no murmurs, rubs or bruits Abdomen: Soft, non tender and non distended. No masses, hepatosplenomegaly or hernias noted. Normal Bowel sounds; there is no succussion splash Rectal:deferred Musculoskeletal: Symmetrical with no gross deformities  Skin: No lesions on visible extremities Pulses:  Normal pulses noted Extremities: No clubbing, cyanosis, edema or deformities noted Neurological: Alert oriented x 4, grossly nonfocal Cervical Nodes:  No significant cervical adenopathy Inguinal Nodes: No significant inguinal adenopathy Psychological:  Alert and cooperative. Normal mood and affect

## 2011-11-24 NOTE — Assessment & Plan Note (Addendum)
Patient has severe reflux symptoms now complicated by dysphagia suggesting an early esophageal stricture. She is symptomatic despite PPI therapy.  Recommendations #1 antireflux measures #2 upper endoscopy with dilatation as indicated #3 trial of dexilant 60 mg twice a day

## 2011-11-24 NOTE — Patient Instructions (Addendum)
You have been given a separate informational sheet regarding your tobacco use, the importance of quitting and local resources to help you quit.  Gastroesophageal Reflux Disease, Adult Gastroesophageal reflux disease (GERD) happens when acid from your stomach flows up into the esophagus. When acid comes in contact with the esophagus, the acid causes soreness (inflammation) in the esophagus. Over time, GERD may create small holes (ulcers) in the lining of the esophagus. CAUSES   Increased body weight. This puts pressure on the stomach, making acid rise from the stomach into the esophagus.   Smoking. This increases acid production in the stomach.   Drinking alcohol. This causes decreased pressure in the lower esophageal sphincter (valve or ring of muscle between the esophagus and stomach), allowing acid from the stomach into the esophagus.   Late evening meals and a full stomach. This increases pressure and acid production in the stomach.   A malformed lower esophageal sphincter.  Sometimes, no cause is found. SYMPTOMS   Burning pain in the lower part of the mid-chest behind the breastbone and in the mid-stomach area. This may occur twice a week or more often.   Trouble swallowing.   Sore throat.   Dry cough.   Asthma-like symptoms including chest tightness, shortness of breath, or wheezing.  DIAGNOSIS  Your caregiver may be able to diagnose GERD based on your symptoms. In some cases, X-rays and other tests may be done to check for complications or to check the condition of your stomach and esophagus. TREATMENT  Your caregiver may recommend over-the-counter or prescription medicines to help decrease acid production. Ask your caregiver before starting or adding any new medicines.  HOME CARE INSTRUCTIONS   Change the factors that you can control. Ask your caregiver for guidance concerning weight loss, quitting smoking, and alcohol consumption.   Avoid foods and drinks that make your  symptoms worse, such as:   Caffeine or alcoholic drinks.   Chocolate.   Peppermint or mint flavorings.   Garlic and onions.   Spicy foods.   Citrus fruits, such as oranges, lemons, or limes.   Tomato-based foods such as sauce, chili, salsa, and pizza.   Fried and fatty foods.   Avoid lying down for the 3 hours prior to your bedtime or prior to taking a nap.   Eat small, frequent meals instead of large meals.   Wear loose-fitting clothing. Do not wear anything tight around your waist that causes pressure on your stomach.   Raise the head of your bed 6 to 8 inches with wood blocks to help you sleep. Extra pillows will not help.   Only take over-the-counter or prescription medicines for pain, discomfort, or fever as directed by your caregiver.   Do not take aspirin, ibuprofen, or other nonsteroidal anti-inflammatory drugs (NSAIDs).  SEEK IMMEDIATE MEDICAL CARE IF:   You have pain in your arms, neck, jaw, teeth, or back.   Your pain increases or changes in intensity or duration.   You develop nausea, vomiting, or sweating (diaphoresis).   You develop shortness of breath, or you faint.   Your vomit is green, yellow, black, or looks like coffee grounds or blood.   Your stool is red, bloody, or black.  These symptoms could be signs of other problems, such as heart disease, gastric bleeding, or esophageal bleeding. MAKE SURE YOU:   Understand these instructions.   Will watch your condition.   Will get help right away if you are not doing well or get worse.  Document  Released: 07/15/2005 Document Revised: 06/17/2011 Document Reviewed: 04/24/2011 [facility] Patient Information 2012 [place], [place].

## 2011-12-04 ENCOUNTER — Ambulatory Visit (AMBULATORY_SURGERY_CENTER): Payer: Medicaid Other | Admitting: Gastroenterology

## 2011-12-04 ENCOUNTER — Encounter: Payer: Self-pay | Admitting: Gastroenterology

## 2011-12-04 VITALS — BP 138/85 | HR 77 | Temp 96.0°F | Resp 20 | Ht 65.0 in | Wt 216.0 lb

## 2011-12-04 DIAGNOSIS — K222 Esophageal obstruction: Secondary | ICD-10-CM

## 2011-12-04 DIAGNOSIS — K2990 Gastroduodenitis, unspecified, without bleeding: Secondary | ICD-10-CM

## 2011-12-04 DIAGNOSIS — K255 Chronic or unspecified gastric ulcer with perforation: Secondary | ICD-10-CM

## 2011-12-04 DIAGNOSIS — K259 Gastric ulcer, unspecified as acute or chronic, without hemorrhage or perforation: Secondary | ICD-10-CM

## 2011-12-04 DIAGNOSIS — K219 Gastro-esophageal reflux disease without esophagitis: Secondary | ICD-10-CM

## 2011-12-04 DIAGNOSIS — K2970 Gastritis, unspecified, without bleeding: Secondary | ICD-10-CM

## 2011-12-04 MED ORDER — DEXLANSOPRAZOLE 60 MG PO CPDR: 60.0000 mg | DELAYED_RELEASE_CAPSULE | Freq: Two times a day (BID) | ORAL | Status: DC

## 2011-12-04 MED ORDER — SODIUM CHLORIDE 0.9 % IV SOLN: 500.0000 mL | INTRAVENOUS | Status: DC

## 2011-12-04 NOTE — Progress Notes (Signed)
Patient did not experience any of the following events: a burn prior to discharge; a fall within the facility; wrong site/side/patient/procedure/implant event; or a hospital transfer or hospital admission upon discharge from the facility. (G8907) Patient did not have preoperative order for IV antibiotic SSI prophylaxis. (G8918)  

## 2011-12-04 NOTE — Assessment & Plan Note (Signed)
Pt remains symptomatic despite PPI therapy, and now c/o dysphagia.  Recommendations 1) trial of dexilant 60mg  bid 2) EGD with possible dilitation

## 2011-12-04 NOTE — Patient Instructions (Addendum)
FOLLOW THE INSTRUCTIONS ON THE GREEN AND BLUE INSTRUCTION SHEETS.  CONTINUE YOUR MEDICATIONS. CONTINUE YOUR DEXILANT TWICE DAILY.   FOLLOW THE DILATATION DIET:  NOTHING TO EAT OR DRINK UNTIL 2:45. CLEAR LIQUIDS ONLY FROM 2:45 UNTIL 3:45. SOFT FOODS ONLY FOR THE REMAINDER OF THE DAY. RESUME REGULAR DIET IN AM.  CALL THE OFFICE TO SCHEDULE AN APPOINTMENT IN ONE MONTH. [phone].  STOP TAKING ANY NSAID PAIN MEDICATION, ALEVE, ADVIL, IBUPROFEN, NAPROSYN TYPE PRODUCTS.

## 2011-12-04 NOTE — Op Note (Signed)
[facility] [address] [place], [place]  [address]  ENDOSCOPY PROCEDURE REPORT  PATIENT:  Stacie Carrillo, Stacie Carrillo  MR#:  [id number] BIRTHDATE:  June 07, 1981, 31 yrs. old  GENDER:  female  ENDOSCOPIST:  Barbette Hair. Arlyce Dice, MD Referred by:  PROCEDURE DATE:  12/04/2011 PROCEDURE:  EGD with biopsy, 43239, Maloney Dilation of Esophagus ASA CLASS:  Class II INDICATIONS:  reflux symptoms despite therapy, dysphagia  MEDICATIONS:   MAC sedation, administered by CRNA propofol 300mg IV, glycopyrrolate (Robinal) 0.2 mg IV, 0.6cc simethancone 0.6 cc PO TOPICAL ANESTHETIC:  DESCRIPTION OF PROCEDURE:   After the risks and benefits of the procedure were explained, informed consent was obtained.  The LB GIF-H180 [id number] endoscope was introduced through the mouth and advanced to the third portion of the duodenum.  The instrument was slowly withdrawn as the mucosa was fully examined. <<PROCEDUREIMAGES>>  A stricture was found at the gastroesophageal junction (see image9). Early esophageal stricture Dilation with maloney dilator 18mm Moderate resistance; no heme  Multiple ulcers were found in the antrum. At least 4 superficial 2-86mm prepyloric ulcers. Bxs taken (see image6, image7, image5, and image4).  Otherwise the examination was normal (see image3 and image8).    Retroflexed views revealed no abnormalities.    The scope was then withdrawn from the patient and the procedure completed.  COMPLICATIONS:  None  ENDOSCOPIC IMPRESSION: 1) Stricture at the gastroesophageal junction 2) Ulcers, multiple in the antrum 3) Otherwise normal examination RECOMMENDATIONS: 1) Call office next 2-3 days to schedule an office appointment for 1 month 2) continue dexilant bid  ______________________________ Barbette Hair. Arlyce Dice, MD  CC:  Arnoldo Hooker MD  n. Rosalie DoctorBarbette Hair. Kaplan at 12/04/2011 01:49 PM  Kathleen Lime, [phone]

## 2011-12-04 NOTE — Progress Notes (Signed)
PATIENT STATING SHE TAKE MOTRIN 800 MG DAILY SINCE AUGUST.DISCUSSED WITH DR. KAPLAN, ORDERED TO STOP IMMEDIATELY TAKING ANY NSAIDS. DISCUSSED WITH PATIENT AND CAREGIVER WHO VERBALIZE UNDERSTANDING.

## 2011-12-07 ENCOUNTER — Telehealth: Payer: Self-pay | Admitting: *Deleted

## 2011-12-07 NOTE — Telephone Encounter (Signed)
  Follow up Call-  Call back number 12/04/2011  Post procedure Call Back phone  # [phone]  Permission to leave phone message Yes     Patient questions:  Do you have a fever, pain , or abdominal swelling? no Pain Score  0 *  Have you tolerated food without any problems? yes  Have you been able to return to your normal activities? yes  Do you have any questions about your discharge instructions: Diet   no Medications  no Follow up visit  no  Do you have questions or concerns about your Care? no  Actions: * If pain score is 4 or above: No action needed, pain <4.

## 2011-12-11 ENCOUNTER — Encounter: Payer: Self-pay | Admitting: Gastroenterology

## 2011-12-11 ENCOUNTER — Other Ambulatory Visit: Payer: Self-pay | Admitting: Gastroenterology

## 2011-12-11 MED ORDER — AMOXICILL-CLARITHRO-LANSOPRAZ PO MISC: Freq: Two times a day (BID) | ORAL | Status: DC

## 2011-12-16 ENCOUNTER — Telehealth: Payer: Self-pay | Admitting: Gastroenterology

## 2011-12-16 NOTE — Telephone Encounter (Signed)
Pt cannot afford Prevpac. It will cost her 550.00 and the pt does not have insurance at this time. Requesting that we send in cheaper separate scripts for amoxicillin, clarithromycin, and omeprazole. Please advise.

## 2011-12-16 NOTE — Telephone Encounter (Signed)
Lets give her pylera

## 2011-12-17 MED ORDER — AMOXICILLIN 500 MG PO CAPS: 500.0000 mg | ORAL_CAPSULE | Freq: Two times a day (BID) | ORAL | Status: DC

## 2011-12-17 MED ORDER — OMEPRAZOLE 40 MG PO CPDR: 40.0000 mg | DELAYED_RELEASE_CAPSULE | Freq: Two times a day (BID) | ORAL | Status: DC

## 2011-12-17 MED ORDER — CLARITHROMYCIN 500 MG PO TABS: 500.0000 mg | ORAL_TABLET | Freq: Two times a day (BID) | ORAL | Status: DC

## 2011-12-17 NOTE — Telephone Encounter (Signed)
amox 500mg  bid clarithro 500mg  bid Omeprazole 40mg  bid  All for 14 days

## 2011-12-17 NOTE — Telephone Encounter (Signed)
Pt aware.

## 2011-12-17 NOTE — Telephone Encounter (Signed)
Pharmacy states pt cannot afford this either. Request individual scripts be sent in for the different drugs. Please advise.

## 2011-12-31 ENCOUNTER — Ambulatory Visit (INDEPENDENT_AMBULATORY_CARE_PROVIDER_SITE_OTHER): Payer: Medicaid Other | Admitting: Gastroenterology

## 2011-12-31 ENCOUNTER — Encounter: Payer: Self-pay | Admitting: Gastroenterology

## 2011-12-31 VITALS — BP 96/64 | HR 88 | Ht 65.0 in | Wt 215.5 lb

## 2011-12-31 DIAGNOSIS — K219 Gastro-esophageal reflux disease without esophagitis: Secondary | ICD-10-CM

## 2011-12-31 DIAGNOSIS — K222 Esophageal obstruction: Secondary | ICD-10-CM | POA: Insufficient documentation

## 2011-12-31 NOTE — Progress Notes (Signed)
History of Present Illness:  Stacie Carrillo has returned following upper endoscopy with dilatation of a distal esophageal stricture. Superficial prepyloric ulcers were seen. Biopsies demonstrated H. pylori.  She is no longer having pyrosis on a regimen of dexilant twice a day. Dysphagia has subsided. She has not taken medicines for H. pylori yet because of the cost.    Review of Systems: Pertinent positive and negative review of systems were noted in the above HPI section. All other review of systems were otherwise negative.    Current Medications, Allergies, Past Medical History, Past Surgical History, Family History and Social History were reviewed in [employer] electronic medical record  Vital signs were reviewed in today's medical record. Physical Exam: General: Well developed , well nourished, no acute distress

## 2011-12-31 NOTE — Assessment & Plan Note (Signed)
Symptoms have significantly improved with dexilant. She'll be treated for H. pylori. Plan to reduce dexilant to once daily

## 2011-12-31 NOTE — Assessment & Plan Note (Signed)
Repeat dilatation as needed 

## 2011-12-31 NOTE — Patient Instructions (Addendum)
You have been given a separate informational sheet regarding your tobacco use, the importance of quitting and local resources to help you quit. We have given you samples of Omeclamox-Pak You will take as prescribed for 10 days Call back as needed

## 2012-03-12 ENCOUNTER — Emergency Department (HOSPITAL_COMMUNITY)
Admission: EM | Admit: 2012-03-12 | Discharge: 2012-03-12 | Disposition: A | Discharge status: Home / Self Care | Payer: Medicaid Other | Attending: Emergency Medicine | Admitting: Emergency Medicine

## 2012-03-12 ENCOUNTER — Encounter (HOSPITAL_COMMUNITY): Payer: Self-pay

## 2012-03-12 DIAGNOSIS — J029 Acute pharyngitis, unspecified: Secondary | ICD-10-CM

## 2012-03-12 DIAGNOSIS — K219 Gastro-esophageal reflux disease without esophagitis: Secondary | ICD-10-CM | POA: Insufficient documentation

## 2012-03-12 DIAGNOSIS — E282 Polycystic ovarian syndrome: Secondary | ICD-10-CM | POA: Insufficient documentation

## 2012-03-12 DIAGNOSIS — F17200 Nicotine dependence, unspecified, uncomplicated: Secondary | ICD-10-CM | POA: Insufficient documentation

## 2012-03-12 DIAGNOSIS — E669 Obesity, unspecified: Secondary | ICD-10-CM | POA: Insufficient documentation

## 2012-03-12 DIAGNOSIS — D649 Anemia, unspecified: Secondary | ICD-10-CM | POA: Insufficient documentation

## 2012-03-12 LAB — URINALYSIS, ROUTINE W REFLEX MICROSCOPIC
Bilirubin Urine: NEGATIVE
Ketones, ur: NEGATIVE mg/dL
Nitrite: NEGATIVE
Specific Gravity, Urine: 1.015 (ref 1.005–1.030)
Urobilinogen, UA: 0.2 mg/dL (ref 0.0–1.0)
pH: 7.5 (ref 5.0–8.0)

## 2012-03-12 LAB — URINE MICROSCOPIC-ADD ON

## 2012-03-12 MED ORDER — AMOXICILLIN 500 MG PO CAPS: 500.0000 mg | ORAL_CAPSULE | Freq: Three times a day (TID) | ORAL | Status: AC

## 2012-03-12 MED ORDER — ACETAMINOPHEN 325 MG PO TABS
650.0000 mg | ORAL_TABLET | Freq: Once | ORAL | Status: AC
  Administered 2012-03-12: 650 mg via ORAL
  Filled 2012-03-12: qty 2

## 2012-03-12 NOTE — Discharge Instructions (Signed)

## 2012-03-12 NOTE — ED Notes (Signed)
Patient reports general aching with dry cough and bilateral ear pain and sore throat x 1 day, no distress

## 2012-03-12 NOTE — ED Provider Notes (Signed)
History     CSN: [id number]  Arrival date & time 03/12/12  1037   First MD Initiated Contact with Patient 03/12/12 1102      Chief Complaint  Patient presents with  . Nasal Congestion    (Consider location/radiation/quality/duration/timing/severity/associated sxs/prior treatment) Patient is a 31 y.o. female presenting with cough. The history is provided by the patient. No language interpreter was used.  Cough This is a new problem. The current episode started yesterday. The problem occurs constantly. The problem has been gradually worsening. The cough is non-productive. The maximum temperature recorded prior to her arrival was 100 to 100.9 F. Associated symptoms include rhinorrhea and shortness of breath. She has tried nothing for the symptoms. She is not a smoker.  Pt complains of a cough and congestion.   Pt reports she has a child that is sick.  Pt also complains of low back pain.  Pt worried that she may have a uti.  Past Medical History  Diagnosis Date  . Depression   . Polycystic ovarian syndrome   . Obesity   . Gestational diabetes   . Reflux   . Anemia   . Anxiety   . Ulcer     Past Surgical History  Procedure Date  . Cesarean section     x3  . Tubal ligation     Family History  Problem Relation Age of Onset  . Throat cancer Maternal Aunt   . Cirrhosis Mother   . Diabetes Paternal Grandmother     History  Substance Use Topics  . Smoking status: Current Everyday Smoker -- 0.2 packs/day for 19 years    Types: Cigarettes  . Smokeless tobacco: Never Used  . Alcohol Use: No     occasionally    OB History    Grav Para Term Preterm Abortions TAB SAB Ect Mult Living   4 4 4  0 0 0 0 0 0 4      Review of Systems  HENT: Positive for rhinorrhea.   Respiratory: Positive for cough and shortness of breath.   All other systems reviewed and are negative.    Allergies  Latex  Home Medications   Current Outpatient Rx  Name Route Sig Dispense Refill  .  DEXLANSOPRAZOLE 60 MG PO CPDR Oral Take 60 mg by mouth 2 (two) times daily.    Marland Kitchen FLUOCINONIDE 0.05 % EX CREA Topical Apply 1 application topically 2 (two) times daily as needed. For eczema    . DEXLANSOPRAZOLE 60 MG PO CPDR Oral Take 1 capsule (60 mg total) by mouth 2 (two) times daily. 60 capsule 1    BP 108/71  Pulse 91  Temp(Src) 98.3 F (36.8 C) (Oral)  Resp 18  SpO2 100%  Breastfeeding? No  Physical Exam  Nursing note and vitals reviewed. Constitutional: She is oriented to person, place, and time. She appears well-developed and well-nourished.  HENT:  Head: Normocephalic and atraumatic.  Right Ear: External ear normal.  Left Ear: External ear normal.  Nose: Nose normal.       Throat erythematous  Eyes: Conjunctivae and EOM are normal. Pupils are equal, round, and reactive to light.  Neck: Normal range of motion.  Cardiovascular: Normal rate and normal heart sounds.   Pulmonary/Chest: Effort normal and breath sounds normal.  Abdominal: Soft. Bowel sounds are normal.  Musculoskeletal: Normal range of motion.  Neurological: She is alert and oriented to person, place, and time. She has normal reflexes.  Skin: Skin is warm.  Psychiatric: She has  a normal mood and affect.    ED Course  Procedures (including critical care time)  Labs Reviewed  URINALYSIS, ROUTINE W REFLEX MICROSCOPIC - Abnormal; Notable for the following:    Hgb urine dipstick SMALL (*)    All other components within normal limits  PREGNANCY, URINE  URINE MICROSCOPIC-ADD ON   No results found.   No diagnosis found.    MDM   Results for orders placed during the hospital encounter of 03/12/12  URINALYSIS, ROUTINE W REFLEX MICROSCOPIC      Component Value Range   Color, Urine YELLOW  YELLOW    APPearance CLEAR  CLEAR    Specific Gravity, Urine 1.015  1.005 - 1.030    pH 7.5  5.0 - 8.0    Glucose, UA NEGATIVE  NEGATIVE (mg/dL)   Hgb urine dipstick SMALL (*) NEGATIVE    Bilirubin Urine NEGATIVE   NEGATIVE    Ketones, ur NEGATIVE  NEGATIVE (mg/dL)   Protein, ur NEGATIVE  NEGATIVE (mg/dL)   Urobilinogen, UA 0.2  0.0 - 1.0 (mg/dL)   Nitrite NEGATIVE  NEGATIVE    Leukocytes, UA NEGATIVE  NEGATIVE   PREGNANCY, URINE      Component Value Range   Preg Test, Ur NEGATIVE  NEGATIVE   URINE MICROSCOPIC-ADD ON      Component Value Range   Squamous Epithelial / LPF RARE  RARE    WBC, UA 0-2  <3 (WBC/hpf)   No results found.   No evidence of uti.   I will treat pharyngitis.   Pt advised to follow up with her MD for recheck.     Lonia Skinner [place], [place] 03/12/12 1213

## 2012-03-12 NOTE — ED Notes (Signed)
Pt reports generalized body aches, sore throat, bilateral ear pain, dry cough and cold chills. Pt reports co-worker had similar issues recently, but pt is unsure what her diagnosis was. Pt reports her symptoms started last night.  Pt reports she has taken nothing for symptoms. Pt also reports headache.

## 2012-03-20 NOTE — ED Provider Notes (Signed)
Medical screening examination/treatment/procedure(s) were performed by non-physician practitioner and as supervising physician I was immediately available for consultation/collaboration.  Donnetta Hutching, MD 03/20/12 Jacinta Shoe

## 2012-06-26 ENCOUNTER — Emergency Department (HOSPITAL_COMMUNITY)
Admission: EM | Admit: 2012-06-26 | Discharge: 2012-06-26 | Disposition: A | Discharge status: Home / Self Care | Payer: Medicaid Other | Source: Home / Self Care

## 2012-06-26 ENCOUNTER — Encounter (HOSPITAL_COMMUNITY): Payer: Self-pay | Admitting: *Deleted

## 2012-06-26 DIAGNOSIS — S46919A Strain of unspecified muscle, fascia and tendon at shoulder and upper arm level, unspecified arm, initial encounter: Secondary | ICD-10-CM

## 2012-06-26 DIAGNOSIS — M545 Low back pain: Secondary | ICD-10-CM

## 2012-06-26 DIAGNOSIS — IMO0002 Reserved for concepts with insufficient information to code with codable children: Secondary | ICD-10-CM

## 2012-06-26 DIAGNOSIS — M542 Cervicalgia: Secondary | ICD-10-CM

## 2012-06-26 MED ORDER — MELOXICAM 15 MG PO TABS: 15.0000 mg | ORAL_TABLET | Freq: Every day | ORAL | Status: DC

## 2012-06-26 MED ORDER — CYCLOBENZAPRINE HCL 5 MG PO TABS: 5.0000 mg | ORAL_TABLET | Freq: Three times a day (TID) | ORAL | Status: AC | PRN

## 2012-06-26 MED ORDER — TRAMADOL HCL 50 MG PO TABS: 50.0000 mg | ORAL_TABLET | Freq: Four times a day (QID) | ORAL | Status: AC | PRN

## 2012-06-26 NOTE — ED Notes (Signed)
Pt with c/o bilateral shoulder/ mid to lower back and neck pain - increased pain with minimal movement - onset yesterday

## 2012-06-26 NOTE — ED Provider Notes (Signed)
History     CSN: [id number]  Arrival date & time 06/26/12  1234   None     Chief Complaint  Patient presents with  . Shoulder Pain  . 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 Denies fall or injury. St is very sore in these areas. Worse with movement and working. Better with rest. Pain most noticeable yesterday and getting worse. Denies focal neuro sx's although pain in L low back often radiates into L buttock.   Patient is a 31 y.o. female presenting with shoulder pain and back pain. The history is provided by the spouse.  Shoulder Pain  Back Pain  Pertinent negatives include no fever and no numbness.    Past Medical History  Diagnosis Date  . Depression   . Polycystic ovarian syndrome   . Obesity   . Gestational diabetes   . Reflux   . Anemia   . Anxiety   . Ulcer     Past Surgical History  Procedure Date  . Cesarean section     x3  . Tubal ligation     Family History  Problem Relation Age of Onset  . Throat cancer Maternal Aunt   . Cirrhosis Mother   . Diabetes Paternal Grandmother     History  Substance Use Topics  . Smoking status: Current Everyday Smoker -- 0.2 packs/day for 19 years    Types: Cigarettes  . Smokeless tobacco: Never Used  . Alcohol Use: No     occasionally    OB History    Grav Para Term Preterm Abortions TAB SAB Ect Mult Living   4 4 4  0 0 0 0 0 0 4      Review of Systems  Constitutional: Positive for activity change. Negative for fever and fatigue.  HENT: Negative.   Respiratory: Negative.   Cardiovascular: Negative.   Musculoskeletal: Positive for myalgias and back pain. Negative for joint swelling.  Neurological: Negative for dizziness, facial asymmetry, light-headedness and numbness.    Allergies  Latex  Home Medications    Current Outpatient Rx  Name Route Sig Dispense Refill  . DEXLANSOPRAZOLE 60 MG PO CPDR Oral Take 60 mg by mouth 2 (two) times daily.    Marland Kitchen FLUOCINONIDE 0.05 % EX CREA Topical Apply 1 application topically 2 (two) times daily as needed. For eczema    . CYCLOBENZAPRINE HCL 5 MG PO TABS Oral Take 1 tablet (5 mg total) by mouth 3 (three) times daily as needed for muscle spasms. 30 tablet 0  . DEXLANSOPRAZOLE 60 MG PO CPDR Oral Take 1 capsule (60 mg total) by mouth 2 (two) times daily. 60 capsule 1  . MELOXICAM 15 MG PO TABS Oral Take 1 tablet (15 mg total) by mouth daily. 14 tablet 0  . TRAMADOL HCL 50 MG PO TABS Oral Take 1 tablet (50 mg total) by mouth every 6 (six) hours as needed for pain. 25 tablet 0    BP 113/78  Pulse 80  Temp 98.3 F (36.8 C) (Oral)  Resp 17  SpO2 100%  LMP 06/06/2012  Physical Exam  Constitutional: She is oriented to person, place, and time. She appears well-developed and well-nourished.  Neck: Normal range of motion.  Musculoskeletal:       Tenderness in bilateral splenius capitii muscles, trapezius and interscapular muscles. Tenderness in low L paralumbosacral musculature. Able  to rotate head with full ROM and flex to 60 deg.   Lymphadenopathy:    She has no cervical adenopathy.  Neurological: She is alert and oriented to person, place, and time. She has normal reflexes. No cranial nerve deficit.  Skin: Skin is warm and dry. No rash noted.  Psychiatric: She has a normal mood and affect.    ED Course  Procedures (including critical care time)  Labs Reviewed - No data to display No results found.   1. Cervicalgia   2. Repetitive strain injury of shoulder   3. Low back pain       MDM  Flexeril 5mg  tid prn Ultram 50mg  q 6h prn meloxicam 15mg  q d Heat stretches, avoid activities that make it worse.         Hayden Rasmussen, NP 06/26/12 1444  Hayden Rasmussen, NP 06/26/12 2156

## 2012-06-27 NOTE — ED Provider Notes (Signed)
Medical screening examination/treatment/procedure(s) were performed by non-physician practitioner and as supervising physician I was immediately available for consultation/collaboration.  Luiz Blare MD   Luiz Blare, MD 06/27/12 0800

## 2012-07-31 ENCOUNTER — Inpatient Hospital Stay (HOSPITAL_COMMUNITY)
Admission: AD | Admit: 2012-07-31 | Discharge: 2012-07-31 | Disposition: A | Discharge status: Home / Self Care | Payer: Self-pay | Source: Ambulatory Visit | Attending: Obstetrics | Admitting: Obstetrics

## 2012-07-31 ENCOUNTER — Encounter (HOSPITAL_COMMUNITY): Payer: Self-pay | Admitting: Obstetrics and Gynecology

## 2012-07-31 ENCOUNTER — Inpatient Hospital Stay (HOSPITAL_COMMUNITY): Payer: Self-pay

## 2012-07-31 DIAGNOSIS — R109 Unspecified abdominal pain: Secondary | ICD-10-CM | POA: Insufficient documentation

## 2012-07-31 DIAGNOSIS — N946 Dysmenorrhea, unspecified: Secondary | ICD-10-CM | POA: Insufficient documentation

## 2012-07-31 LAB — URINE MICROSCOPIC-ADD ON

## 2012-07-31 LAB — URINALYSIS, ROUTINE W REFLEX MICROSCOPIC
Bilirubin Urine: NEGATIVE
Nitrite: NEGATIVE
Specific Gravity, Urine: >1.03 — ABNORMAL HIGH (ref 1.005–1.030)
Urobilinogen, UA: 1 mg/dL (ref 0.0–1.0)
pH: 6.5 (ref 5.0–8.0)

## 2012-07-31 LAB — WET PREP, GENITAL: Yeast Wet Prep HPF POC: NONE SEEN

## 2012-07-31 MED ORDER — HYDROMORPHONE HCL PF 1 MG/ML IJ SOLN
1.0000 mg | Freq: Once | INTRAMUSCULAR | Status: AC
  Administered 2012-07-31: 1 mg via INTRAMUSCULAR
  Filled 2012-07-31: qty 1

## 2012-07-31 MED ORDER — OXYCODONE-ACETAMINOPHEN 5-500 MG PO TABS: 1.0000 | ORAL_TABLET | ORAL | Status: DC | PRN

## 2012-07-31 MED ORDER — NAPROXEN 500 MG PO TABS: 500.0000 mg | ORAL_TABLET | Freq: Two times a day (BID) | ORAL | Status: DC

## 2012-07-31 MED ORDER — METRONIDAZOLE 500 MG PO TABS: 500.0000 mg | ORAL_TABLET | Freq: Two times a day (BID) | ORAL | Status: DC

## 2012-07-31 NOTE — MAU Note (Signed)
Patient states she had sudden onset of left lower abdominal cramping this am. Started her period yesterday. Nausea, no vomiting.

## 2012-07-31 NOTE — MAU Note (Signed)
Pt presents to MAU with chief complaint of sudden onset of right sided abdominal pain. Pt says she has been crampy throughout the day but suddenly the pain got worse.

## 2012-07-31 NOTE — [facility] Provider Note (Signed)
History     CSN: [id number]  Arrival date and time: 07/31/12 1238   None     No chief complaint on file.  HPI 31 y.o. [id number] with left sided pain. Patient's last menstrual period was 07/30/2012. Heavy bleeding, crampy all day, suddenly became worse on left side today. Had Motrin 600mg  prior to coming to [facility], did not help, usually does help with pain during period.   Past Medical History  Diagnosis Date  . Depression   . Polycystic ovarian syndrome   . Obesity   . Gestational diabetes   . Reflux   . Anemia   . Anxiety   . Ulcer     Past Surgical History  Procedure Date  . Cesarean section     x3  . Tubal ligation     Family History  Problem Relation Age of Onset  . Throat cancer Maternal Aunt   . Cirrhosis Mother   . Diabetes Paternal Grandmother     History  Substance Use Topics  . Smoking status: Current Every Day Smoker -- 0.2 packs/day for 19 years    Types: Cigarettes  . Smokeless tobacco: Never Used  . Alcohol Use: No     occasionally    Allergies:  Allergies  Allergen Reactions  . Latex Other (See Comments)    Pt reports "eczema" with latex use    Prescriptions prior to admission  Medication Sig Dispense Refill  . dexlansoprazole (DEXILANT) 60 MG capsule Take 1 capsule (60 mg total) by mouth 2 (two) times daily.  60 capsule  1  . dexlansoprazole (DEXILANT) 60 MG capsule Take 60 mg by mouth 2 (two) times daily.      . fluocinonide (LIDEX) 0.05 % cream Apply 1 application topically 2 (two) times daily as needed. For eczema      . meloxicam (MOBIC) 15 MG tablet Take 1 tablet (15 mg total) by mouth daily.  14 tablet  0    Review of Systems  Constitutional: Negative.   Respiratory: Negative.   Cardiovascular: Negative.   Gastrointestinal: Positive for abdominal pain. Negative for nausea, vomiting, diarrhea and constipation.  Genitourinary: Negative for dysuria, urgency, frequency, hematuria and flank pain.       Positive for vaginal bleeding   Musculoskeletal: Negative.   Neurological: Negative.   Psychiatric/Behavioral: Negative.    Physical Exam   There were no vitals taken for this visit.  Physical Exam  Nursing note and vitals reviewed. Constitutional: She is oriented to person, place, and time. She appears well-developed and well-nourished. No distress.  HENT:  Head: Normocephalic and atraumatic.  Cardiovascular: Normal rate and regular rhythm.   Respiratory: Effort normal. No respiratory distress.  GI: Soft. She exhibits no distension and no mass. There is no tenderness. There is no rebound and no guarding.  Genitourinary: There is no rash or lesion on the right labia. There is no rash or lesion on the left labia. Uterus is not deviated, not enlarged, not fixed and not tender. Cervix exhibits no motion tenderness, no discharge and no friability. Right adnexum displays no mass, no tenderness and no fullness. Left adnexum displays tenderness. Left adnexum displays no mass and no fullness. No erythema, tenderness or bleeding around the vagina. No vaginal discharge found.  Neurological: She is alert and oriented to person, place, and time.  Skin: Skin is warm and dry.  Psychiatric: She has a normal mood and affect.    MAU Course  Procedures  Results for orders placed during the hospital  encounter of 07/31/12 (from the past 24 hour(s))  URINALYSIS, ROUTINE W REFLEX MICROSCOPIC     Status: Abnormal   Collection Time   07/31/12  1:20 PM      Component Value Range   Color, Urine YELLOW  YELLOW   APPearance HAZY (*) CLEAR   Specific Gravity, Urine >1.030 (*) 1.005 - 1.030   pH 6.5  5.0 - 8.0   Glucose, UA NEGATIVE  NEGATIVE mg/dL   Hgb urine dipstick LARGE (*) NEGATIVE   Bilirubin Urine NEGATIVE  NEGATIVE   Ketones, ur 15 (*) NEGATIVE mg/dL   Protein, ur NEGATIVE  NEGATIVE mg/dL   Urobilinogen, UA 1.0  0.0 - 1.0 mg/dL   Nitrite NEGATIVE  NEGATIVE   Leukocytes, UA NEGATIVE  NEGATIVE  URINE MICROSCOPIC-ADD ON      Status: Abnormal   Collection Time   07/31/12  1:20 PM      Component Value Range   Squamous Epithelial / LPF FEW (*) RARE   WBC, UA 0-2  <3 WBC/hpf   RBC / HPF 11-20  <3 RBC/hpf   Bacteria, UA FEW (*) RARE   Urine-Other MUCOUS PRESENT    POCT PREGNANCY, URINE     Status: Normal   Collection Time   07/31/12  1:30 PM      Component Value Range   Preg Test, Ur NEGATIVE  NEGATIVE  WET PREP, GENITAL     Status: Abnormal   Collection Time   07/31/12  2:34 PM      Component Value Range   Yeast Wet Prep HPF POC NONE SEEN  NONE SEEN   Trich, Wet Prep NONE SEEN  NONE SEEN   Clue Cells Wet Prep HPF POC MODERATE (*) NONE SEEN   WBC, Wet Prep HPF POC FEW (*) NONE SEEN   US Transvaginal Non-ob  07/31/2012  *RADIOLOGY REPORT*  Clinical Data: Left-sided pelvic pain.  Dysmenorrhea.  Polycystic ovary syndrome.  Negative pregnancy test.  LMP 07/30/2012.  TRANSABDOMINAL AND TRANSVAGINAL ULTRASOUND OF PELVIS  Technique:  Both transabdominal and transvaginal ultrasound examinations of the pelvis were performed.  Transabdominal technique was performed for global imaging of the pelvis including uterus, ovaries, adnexal regions, and pelvic cul-de-sac.  It was necessary to proceed with endovaginal exam following the transabdominal exam to visualize the endometrium and adnexa.  Comparison:  None.  Findings: Uterus:  9.0 x 5.3 x 7.1 cm.  No fibroids or other uterine mass identified.  Prior C-section scar noted in lower uterine segment.  Endometrium: Double layer thickness measures 4 mm transvaginally. No focal lesion visualized.  Right ovary: 3.9 x 2.7 x 2.9 cm.  Normal appearance.  Left ovary: 4.0 x 2.3 x 3.0 cm.  Normal appearance.  Other Findings:  No free fluid  IMPRESSION: Normal study.  No evidence of pelvic mass or other significant abnormality.   Original Report Authenticated By: Danae Orleans, M.D.    US Pelvis Complete  07/31/2012  *RADIOLOGY REPORT*  Clinical Data: Left-sided pelvic pain.  Dysmenorrhea.   Polycystic ovary syndrome.  Negative pregnancy test.  LMP 07/30/2012.  TRANSABDOMINAL AND TRANSVAGINAL ULTRASOUND OF PELVIS  Technique:  Both transabdominal and transvaginal ultrasound examinations of the pelvis were performed.  Transabdominal technique was performed for global imaging of the pelvis including uterus, ovaries, adnexal regions, and pelvic cul-de-sac.  It was necessary to proceed with endovaginal exam following the transabdominal exam to visualize the endometrium and adnexa.  Comparison:  None.  Findings: Uterus:  9.0 x 5.3 x 7.1 cm.  No fibroids or other uterine mass identified.  Prior C-section scar noted in lower uterine segment.  Endometrium: Double layer thickness measures 4 mm transvaginally. No focal lesion visualized.  Right ovary: 3.9 x 2.7 x 2.9 cm.  Normal appearance.  Left ovary: 4.0 x 2.3 x 3.0 cm.  Normal appearance.  Other Findings:  No free fluid  IMPRESSION: Normal study.  No evidence of pelvic mass or other significant abnormality.   Original Report Authenticated By: Danae Orleans, M.D.       .  HYDROmorphone (DILAUDID) injection  1 mg Intramuscular Once     Assessment and Plan   1. Dysmenorrhea       Medication List     As of 08/01/2012  8:31 AM    START taking these medications         metroNIDAZOLE 500 MG tablet   Commonly known as: FLAGYL   Take 1 tablet (500 mg total) by mouth 2 (two) times daily.      naproxen 500 MG tablet   Commonly known as: NAPROSYN   Take 1 tablet (500 mg total) by mouth 2 (two) times daily with a meal.      oxycodone-acetaminophen 5-500 MG per tablet   Commonly known as: ROXICET   Take 1 tablet by mouth every 4 (four) hours as needed for pain.      CONTINUE taking these medications         dexlansoprazole 60 MG capsule   Commonly known as: DEXILANT      fluocinonide cream 0.05 %   Commonly known as: LIDEX          Where to get your medications    These are the prescriptions that you need to pick up. We sent  them to a specific pharmacy, so you will need to go there to get them.   [id number] - [place] ([place]), [place] - [address]    [address] [place] ([place]) [place] [address]    Phone: [phone]        metroNIDAZOLE 500 MG tablet   naproxen 500 MG tablet         You may get these medications from any pharmacy.         oxycodone-acetaminophen 5-500 MG per tablet            Follow-up Information    Follow up with HARPER,CHARLES A, MD. (As needed)    Contact information:   [address] [place] [address] [phone]            [facility] 07/31/2012, 12:48 PM

## 2012-08-01 LAB — GC/CHLAMYDIA PROBE AMP, GENITAL
Chlamydia, DNA Probe: NEGATIVE
GC Probe Amp, Genital: NEGATIVE

## 2012-08-17 ENCOUNTER — Emergency Department (HOSPITAL_COMMUNITY)
Admission: EM | Admit: 2012-08-17 | Discharge: 2012-08-17 | Disposition: A | Discharge status: Home / Self Care | Payer: Self-pay | Attending: Emergency Medicine | Admitting: Emergency Medicine

## 2012-08-17 ENCOUNTER — Encounter (HOSPITAL_COMMUNITY): Payer: Self-pay | Admitting: Emergency Medicine

## 2012-08-17 DIAGNOSIS — Z79899 Other long term (current) drug therapy: Secondary | ICD-10-CM | POA: Insufficient documentation

## 2012-08-17 DIAGNOSIS — E669 Obesity, unspecified: Secondary | ICD-10-CM | POA: Insufficient documentation

## 2012-08-17 DIAGNOSIS — Z8742 Personal history of other diseases of the female genital tract: Secondary | ICD-10-CM | POA: Insufficient documentation

## 2012-08-17 DIAGNOSIS — IMO0002 Reserved for concepts with insufficient information to code with codable children: Secondary | ICD-10-CM | POA: Insufficient documentation

## 2012-08-17 DIAGNOSIS — Z8719 Personal history of other diseases of the digestive system: Secondary | ICD-10-CM | POA: Insufficient documentation

## 2012-08-17 DIAGNOSIS — L02419 Cutaneous abscess of limb, unspecified: Secondary | ICD-10-CM

## 2012-08-17 DIAGNOSIS — D649 Anemia, unspecified: Secondary | ICD-10-CM | POA: Insufficient documentation

## 2012-08-17 DIAGNOSIS — F411 Generalized anxiety disorder: Secondary | ICD-10-CM | POA: Insufficient documentation

## 2012-08-17 DIAGNOSIS — F3289 Other specified depressive episodes: Secondary | ICD-10-CM | POA: Insufficient documentation

## 2012-08-17 DIAGNOSIS — F329 Major depressive disorder, single episode, unspecified: Secondary | ICD-10-CM | POA: Insufficient documentation

## 2012-08-17 DIAGNOSIS — F17200 Nicotine dependence, unspecified, uncomplicated: Secondary | ICD-10-CM | POA: Insufficient documentation

## 2012-08-17 DIAGNOSIS — K219 Gastro-esophageal reflux disease without esophagitis: Secondary | ICD-10-CM | POA: Insufficient documentation

## 2012-08-17 MED ORDER — HYDROCODONE-ACETAMINOPHEN 5-500 MG PO TABS: 1.0000 | ORAL_TABLET | Freq: Four times a day (QID) | ORAL | Status: DC | PRN

## 2012-08-17 MED ORDER — OXYCODONE-ACETAMINOPHEN 5-325 MG PO TABS
1.0000 | ORAL_TABLET | Freq: Once | ORAL | Status: AC
  Administered 2012-08-17: 1 via ORAL
  Filled 2012-08-17: qty 1

## 2012-08-17 MED ORDER — IBUPROFEN 800 MG PO TABS
800.0000 mg | ORAL_TABLET | Freq: Once | ORAL | Status: DC
  Filled 2012-08-17: qty 1

## 2012-08-17 NOTE — ED Provider Notes (Signed)
Medical screening examination/treatment/procedure(s) were performed by non-physician practitioner and as supervising physician I was immediately available for consultation/collaboration.  Cheri Guppy, MD 08/17/12 2356

## 2012-08-17 NOTE — Discharge Instructions (Signed)
Abscess An abscess is an infected area that contains a collection of pus and debris. It can occur in almost any part of the body. An abscess is also known as a furuncle or boil. CAUSES   An abscess occurs when tissue gets infected. This can occur from blockage of oil or sweat glands, infection of hair follicles, or a minor injury to the skin. As the body tries to fight the infection, pus collects in the area and creates pressure under the skin. This pressure causes pain. People with weakened immune systems have difficulty fighting infections and get certain abscesses more often.   SYMPTOMS Usually an abscess develops on the skin and becomes a painful mass that is red, warm, and tender. If the abscess forms under the skin, you may feel a moveable soft area under the skin. Some abscesses break open (rupture) on their own, but most will continue to get worse without care. The infection can spread deeper into the body and eventually into the bloodstream, causing you to feel ill.   DIAGNOSIS   Your caregiver will take your medical history and perform a physical exam. A sample of fluid may also be taken from the abscess to determine what is causing your infection. TREATMENT   Your caregiver may prescribe antibiotic medicines to fight the infection. However, taking antibiotics alone usually does not cure an abscess. Your caregiver may need to make a small cut (incision) in the abscess to drain the pus. In some cases, gauze is packed into the abscess to reduce pain and to continue draining the area. HOME CARE INSTRUCTIONS    Only take over-the-counter or prescription medicines for pain, discomfort, or fever as directed by your caregiver.   If you were prescribed antibiotics, take them as directed. Finish them even if you start to feel better.   If gauze is used, follow your caregiver's directions for changing the gauze.   To avoid spreading the infection:   Keep your draining abscess covered with a  bandage.   Wash your hands well.   Do not share personal care items, towels, or whirlpools with others.   Avoid skin contact with others.   Keep your skin and clothes clean around the abscess.   Keep all follow-up appointments as directed by your caregiver.  SEEK MEDICAL CARE IF:    You have increased pain, swelling, redness, fluid drainage, or bleeding.   You have muscle aches, chills, or a general ill feeling.   You have a fever.  MAKE SURE YOU:    Understand these instructions.   Will watch your condition.   Will get help right away if you are not doing well or get worse.  Document Released: 07/15/2005 Document Revised: 04/05/2012 Document Reviewed: 12/18/2011 [employer] Patient Information 2013 [employer].    

## 2012-08-17 NOTE — ED Provider Notes (Signed)
History     CSN: [id number]  Arrival date & time 08/17/12  1344   First MD Initiated Contact with Patient 08/17/12 1737      Chief Complaint  Patient presents with  . Recurrent Skin Infections    absess under r/axilla    (Consider location/radiation/quality/duration/timing/severity/associated sxs/prior treatment) HPI  Pt to the ER with complaints of abscess to right axilla  She has a history of the same and has to have them drained frequently. She has had this abscess for two weeks and was trying to avoid having to come to the ER for it by using a lot of at home remedies which failed.  No fevers or systemic symptoms. nad vss  Past Medical History  Diagnosis Date  . Depression   . Polycystic ovarian syndrome   . Obesity   . Gestational diabetes   . Reflux   . Anemia   . Anxiety   . Ulcer     Past Surgical History  Procedure Date  . Cesarean section     x3  . Tubal ligation     Family History  Problem Relation Age of Onset  . Throat cancer Maternal Aunt   . Cirrhosis Mother   . Diabetes Paternal Grandmother     History  Substance Use Topics  . Smoking status: Current Every Day Smoker -- 0.2 packs/day for 19 years    Types: Cigarettes  . Smokeless tobacco: Never Used  . Alcohol Use: No     occasionally    OB History    Grav Para Term Preterm Abortions TAB SAB Ect Mult Living   4 4 4  0 0 0 0 0 0 4      Review of Systems  Review of Systems  Gen: no weight loss, fevers, chills, night sweats  Eyes: no discharge or drainage, no occular pain or visual changes  Nose: no epistaxis or rhinorrhea  Mouth: no dental pain, no sore throat  Neck: no neck pain  Lungs:No wheezing, coughing or hemoptysis CV: no chest pain, palpitations, dependent edema or orthopnea  Abd: no abdominal pain, nausea, vomiting  GU: no dysuria or gross hematuria  MSK:  Abscess to right axilla Neuro: no headache, no focal neurologic deficits  Skin: no abnormalities Psyche:  negative.   Allergies  Latex  Home Medications   Current Outpatient Rx  Name Route Sig Dispense Refill  . DEXLANSOPRAZOLE 60 MG PO CPDR Oral Take 60 mg by mouth 2 (two) times daily.    Marland Kitchen FLUOCINONIDE 0.05 % EX CREA Topical Apply 1 application topically 2 (two) times daily as needed. For eczema    . HYDROCODONE-ACETAMINOPHEN 5-500 MG PO TABS Oral Take 1 tablet by mouth every 6 (six) hours as needed for pain. 8 tablet 0  . METRONIDAZOLE 500 MG PO TABS Oral Take 1 tablet (500 mg total) by mouth 2 (two) times daily. 14 tablet 0    BP 137/97  Pulse 86  Temp 98.3 F (36.8 C) (Oral)  Resp 16  SpO2 99%  LMP 07/30/2012  Breastfeeding? No  Physical Exam  Nursing note and vitals reviewed. Constitutional: She appears well-developed and well-nourished. No distress.  HENT:  Head: Normocephalic and atraumatic.  Eyes: Pupils are equal, round, and reactive to light.  Neck: Normal range of motion. Neck supple.  Cardiovascular: Normal rate and regular rhythm.   Pulmonary/Chest: Effort normal.  Abdominal: Soft.  Musculoskeletal:       Arms: Neurological: She is alert.  Skin: Skin is warm and  dry.    ED Course  Procedures (including critical care time)  Labs Reviewed - No data to display No results found.   1. Abscess of axilla       MDM  INCISION AND DRAINAGE Performed by: Dorthula Matas Consent: Verbal consent obtained. Risks and benefits: risks, benefits and alternatives were discussed Type: abscess  Body area: right axilla  Anesthesia: local infiltration  Local anesthetic: lidocaine 2% with epinephrine  Anesthetic total: 4 ml  Complexity: complex Blunt dissection to break up loculations  Drainage: purulent  Drainage amount: copious amounts of drainage.   Patient tolerance: Patient tolerated the procedure well with no immediate complications.   No abx given because no cellulitis with abscess.  Education of care of IandD given  Pt has been advised of  the symptoms that warrant their return to the ED. Patient has voiced understanding and has agreed to follow-up with the PCP or specialist.           Dorthula Matas, PA 08/17/12 1907

## 2012-08-17 NOTE — ED Notes (Signed)
Pt report painful swelling under r/axilla x 2 weeks

## 2012-09-03 ENCOUNTER — Emergency Department (HOSPITAL_COMMUNITY): Payer: Self-pay

## 2012-09-03 ENCOUNTER — Encounter (HOSPITAL_COMMUNITY): Payer: Self-pay | Admitting: Emergency Medicine

## 2012-09-03 ENCOUNTER — Emergency Department (HOSPITAL_COMMUNITY)
Admission: EM | Admit: 2012-09-03 | Discharge: 2012-09-03 | Disposition: A | Discharge status: Home / Self Care | Payer: Self-pay | Attending: Emergency Medicine | Admitting: Emergency Medicine

## 2012-09-03 DIAGNOSIS — E669 Obesity, unspecified: Secondary | ICD-10-CM | POA: Insufficient documentation

## 2012-09-03 DIAGNOSIS — Z8742 Personal history of other diseases of the female genital tract: Secondary | ICD-10-CM | POA: Insufficient documentation

## 2012-09-03 DIAGNOSIS — Z79899 Other long term (current) drug therapy: Secondary | ICD-10-CM | POA: Insufficient documentation

## 2012-09-03 DIAGNOSIS — Z862 Personal history of diseases of the blood and blood-forming organs and certain disorders involving the immune mechanism: Secondary | ICD-10-CM | POA: Insufficient documentation

## 2012-09-03 DIAGNOSIS — K219 Gastro-esophageal reflux disease without esophagitis: Secondary | ICD-10-CM | POA: Insufficient documentation

## 2012-09-03 DIAGNOSIS — Z8659 Personal history of other mental and behavioral disorders: Secondary | ICD-10-CM | POA: Insufficient documentation

## 2012-09-03 DIAGNOSIS — F17200 Nicotine dependence, unspecified, uncomplicated: Secondary | ICD-10-CM | POA: Insufficient documentation

## 2012-09-03 DIAGNOSIS — N946 Dysmenorrhea, unspecified: Secondary | ICD-10-CM | POA: Insufficient documentation

## 2012-09-03 LAB — POCT PREGNANCY, URINE: Preg Test, Ur: NEGATIVE

## 2012-09-03 MED ORDER — ONDANSETRON 8 MG PO TBDP
8.0000 mg | ORAL_TABLET | Freq: Once | ORAL | Status: AC
  Administered 2012-09-03: 8 mg via ORAL
  Filled 2012-09-03: qty 1

## 2012-09-03 MED ORDER — HYDROMORPHONE HCL PF 1 MG/ML IJ SOLN
1.0000 mg | Freq: Once | INTRAMUSCULAR | Status: AC
  Administered 2012-09-03: 1 mg via INTRAMUSCULAR
  Filled 2012-09-03: qty 1

## 2012-09-03 NOTE — ED Notes (Signed)
US at bedside

## 2012-09-03 NOTE — ED Notes (Signed)
[id number]<AV> Expected date:09/03/12<BR> Expected time:12:57 PM<BR> Means of arrival:Ambulance<BR> Comments:<BR> Lower abd pain

## 2012-09-03 NOTE — ED Notes (Signed)
Pt was seen at [facility] 07-31-12 for same c/o, diagnosed w dysmenorrhea.

## 2012-09-03 NOTE — ED Provider Notes (Signed)
Medical screening examination/treatment/procedure(s) were performed by non-physician practitioner and as supervising physician I was immediately available for consultation/collaboration.  Devoria Albe, MD, Armando Gang   Ward Givens, MD 09/03/12 [phone]

## 2012-09-03 NOTE — ED Provider Notes (Signed)
History     CSN: [id number]  Arrival date & time 09/03/12  1306   First MD Initiated Contact with Patient 09/03/12 1312      Chief Complaint  Patient presents with  . Abdominal Pain    (Consider location/radiation/quality/duration/timing/severity/associated sxs/prior treatment) HPI Comments: 31 yo [id number] F w hx of PCOS, dysmenorrhea and tubal ligation presents emergency department with chief complaint of left lower abdominal pain.  Onset of symptoms began at 7 a.m. this morning.  Pain is rated at 10/10 in severity without radiation.  Patient states this is similar to presentation last month when on her menstrual cycle for which [facility] and diagnosed with dysmenorrhea.  At that visit transvaginal and abdominal ultrasound showed no acute abnormalities as well as STD testing.  Patient has not yet started her menstrual cycle, but states that it is expected.  No known exacerbating or alleviating symptoms.  Presentation is moderate.  Patient denies any fever, night sweats, chills, nausea, urinary symptoms, vaginal discharge, vomiting or diarrhea.  Patient is a 31 y.o. female presenting with abdominal pain. The history is provided by the patient.  Abdominal Pain The primary symptoms of the illness include abdominal pain. The primary symptoms of the illness do not include fever, shortness of breath, nausea, vomiting, diarrhea or dysuria.  Symptoms associated with the illness do not include chills, urgency or frequency.    Past Medical History  Diagnosis Date  . Depression   . Polycystic ovarian syndrome   . Obesity   . Gestational diabetes   . Reflux   . Anemia   . Anxiety   . Ulcer     Past Surgical History  Procedure Date  . Cesarean section     x3  . Tubal ligation     Family History  Problem Relation Age of Onset  . Throat cancer Maternal Aunt   . Cirrhosis Mother   . Diabetes Paternal Grandmother     History  Substance Use Topics  .  Smoking status: Current Every Day Smoker -- 0.2 packs/day for 19 years    Types: Cigarettes  . Smokeless tobacco: Never Used  . Alcohol Use: No     Comment: occasionally    OB History    Grav Para Term Preterm Abortions TAB SAB Ect Mult Living   4 4 4  0 0 0 0 0 0 4      Review of Systems  Constitutional: Negative for fever, chills and appetite change.  HENT: Negative for congestion.   Eyes: Negative for visual disturbance.  Respiratory: Negative for shortness of breath.   Cardiovascular: Negative for chest pain and leg swelling.  Gastrointestinal: Positive for abdominal pain. Negative for nausea, vomiting, diarrhea and blood in stool.  Genitourinary: Negative for dysuria, urgency, frequency, difficulty urinating, vaginal pain and dyspareunia.  Neurological: Negative for dizziness, syncope, weakness, light-headedness, numbness and headaches.  Psychiatric/Behavioral: Negative for confusion.  All other systems reviewed and are negative.    Allergies  Latex  Home Medications   Current Outpatient Rx  Name  Route  Sig  Dispense  Refill  . DEXLANSOPRAZOLE 60 MG PO CPDR   Oral   Take 60 mg by mouth 2 (two) times daily.         Marland Kitchen FLUOCINONIDE 0.05 % EX CREA   Topical   Apply 1 application topically 2 (two) times daily as needed. For eczema         . HYDROCODONE-ACETAMINOPHEN 5-500 MG PO TABS  Oral   Take 1 tablet by mouth every 6 (six) hours as needed for pain.   8 tablet   0   . METRONIDAZOLE 500 MG PO TABS   Oral   Take 1 tablet (500 mg total) by mouth 2 (two) times daily.   14 tablet   0     BP 122/77  Pulse 91  Temp 99.1 F (37.3 C) (Oral)  Resp 24  SpO2 100%  LMP 08/05/2012  Physical Exam  Nursing note and vitals reviewed. Constitutional: She is oriented to person, place, and time. She appears well-developed and well-nourished. She appears distressed.       Pt rolling bed, moaning in pain  HENT:  Head: Normocephalic and atraumatic.  Eyes:  Conjunctivae normal and EOM are normal.  Neck: Normal range of motion.  Pulmonary/Chest: Effort normal.  Abdominal:       Obese, soft, normal bowel sounds. LLQ abdominal pain. No peritoneal signs  Genitourinary:       Bimanual: Exam chaperoned- significant left adnexal ttp, no cervical motion tenderness or abnormal odor. No masses palpated.   Musculoskeletal: Normal range of motion.  Neurological: She is alert and oriented to person, place, and time.  Skin: Skin is warm and dry. No rash noted. She is not diaphoretic.  Psychiatric: She has a normal mood and affect. Her behavior is normal.    ED Course  Procedures (including critical care time)   Labs Reviewed  POCT PREGNANCY, URINE   US Transvaginal Non-ob  09/03/2012  *RADIOLOGY REPORT*  Clinical Data: Lower quadrant/left pelvic pain  TRANSABDOMINAL AND TRANSVAGINAL ULTRASOUND OF PELVIS Technique:  Both transabdominal and transvaginal ultrasound examinations of the pelvis were performed. Transabdominal technique was performed for global imaging of the pelvis including uterus, ovaries, adnexal regions, and pelvic cul-de-sac.  It was necessary to proceed with endovaginal exam following the transabdominal exam to visualize the endometrium.  Comparison:  None  Findings:  Uterus: Normal in size and appearance, measuring 9.8 x 5.6 x 6.0 cm.  Endometrium: Normal in thickness and appearance, measuring 9 mm.  Right ovary:  Normal appearance/no adnexal mass, measuring 4.0 x 2.8 x 3.1 cm.  Left ovary: Normal appearance/no adnexal mass, measuring 3.7 x 2.1 x 3.2 cm.  Other findings: No free fluid.  IMPRESSION: Normal study.  No evidence of pelvic mass or other significant abnormality.   Original Report Authenticated By: Charline Bills, M.D.    US Pelvis Complete  09/03/2012  *RADIOLOGY REPORT*  Clinical Data: Lower quadrant/left pelvic pain  TRANSABDOMINAL AND TRANSVAGINAL ULTRASOUND OF PELVIS Technique:  Both transabdominal and transvaginal  ultrasound examinations of the pelvis were performed. Transabdominal technique was performed for global imaging of the pelvis including uterus, ovaries, adnexal regions, and pelvic cul-de-sac.  It was necessary to proceed with endovaginal exam following the transabdominal exam to visualize the endometrium.  Comparison:  None  Findings:  Uterus: Normal in size and appearance, measuring 9.8 x 5.6 x 6.0 cm.  Endometrium: Normal in thickness and appearance, measuring 9 mm.  Right ovary:  Normal appearance/no adnexal mass, measuring 4.0 x 2.8 x 3.1 cm.  Left ovary: Normal appearance/no adnexal mass, measuring 3.7 x 2.1 x 3.2 cm.  Other findings: No free fluid.  IMPRESSION: Normal study.  No evidence of pelvic mass or other significant abnormality.   Original Report Authenticated By: Charline Bills, M.D.      No diagnosis found.    MDM  31 yo f w dx of dysmenorrhea last mo at [facility] ([place],  pelvic, std testing) presents for similar symptoms of LLQ severe abd pain, acute onset at 7 am, however she has not yet started her period which is expected. Urine preg negative. Bimanual exam w significant left adnexal tenderness. D/t acute onset of pain and exam it is felt that transvaginal US is indicated for evaluation of left ovary.  Disposition pending results. Pain medication ordered BP 122/77  Pulse 91  Temp 99.1 F (37.3 C) (Oral)  Resp 24  SpO2 100%  LMP 08/05/2012   No acute findings on [place]. Pain managed in ER, pt asymptomatic in NAD. At this time there does not appear to be any evidence of an acute emergency medical condition and the patient appears stable for discharge with appropriate outpatient follow up.Diagnosis was discussed with patient who verbalizes understanding and is agreeable to discharge.      Jaci Carrel, [place] 09/03/12 1627

## 2012-12-15 ENCOUNTER — Emergency Department (HOSPITAL_COMMUNITY)
Admission: EM | Admit: 2012-12-15 | Discharge: 2012-12-15 | Disposition: A | Discharge status: Home / Self Care | Payer: Self-pay | Attending: Emergency Medicine | Admitting: Emergency Medicine

## 2012-12-15 ENCOUNTER — Encounter (HOSPITAL_COMMUNITY): Payer: Self-pay | Admitting: *Deleted

## 2012-12-15 DIAGNOSIS — R5383 Other fatigue: Secondary | ICD-10-CM | POA: Insufficient documentation

## 2012-12-15 DIAGNOSIS — R112 Nausea with vomiting, unspecified: Secondary | ICD-10-CM | POA: Insufficient documentation

## 2012-12-15 DIAGNOSIS — R51 Headache: Secondary | ICD-10-CM | POA: Insufficient documentation

## 2012-12-15 DIAGNOSIS — Z862 Personal history of diseases of the blood and blood-forming organs and certain disorders involving the immune mechanism: Secondary | ICD-10-CM | POA: Insufficient documentation

## 2012-12-15 DIAGNOSIS — R5381 Other malaise: Secondary | ICD-10-CM | POA: Insufficient documentation

## 2012-12-15 DIAGNOSIS — R197 Diarrhea, unspecified: Secondary | ICD-10-CM | POA: Insufficient documentation

## 2012-12-15 DIAGNOSIS — R1084 Generalized abdominal pain: Secondary | ICD-10-CM | POA: Insufficient documentation

## 2012-12-15 DIAGNOSIS — Z8632 Personal history of gestational diabetes: Secondary | ICD-10-CM | POA: Insufficient documentation

## 2012-12-15 DIAGNOSIS — Z8742 Personal history of other diseases of the female genital tract: Secondary | ICD-10-CM | POA: Insufficient documentation

## 2012-12-15 DIAGNOSIS — K219 Gastro-esophageal reflux disease without esophagitis: Secondary | ICD-10-CM | POA: Insufficient documentation

## 2012-12-15 DIAGNOSIS — Z8659 Personal history of other mental and behavioral disorders: Secondary | ICD-10-CM | POA: Insufficient documentation

## 2012-12-15 DIAGNOSIS — F17200 Nicotine dependence, unspecified, uncomplicated: Secondary | ICD-10-CM | POA: Insufficient documentation

## 2012-12-15 DIAGNOSIS — E669 Obesity, unspecified: Secondary | ICD-10-CM | POA: Insufficient documentation

## 2012-12-15 LAB — POCT I-STAT, CHEM 8
BUN: 11 mg/dL (ref 6–23)
Calcium, Ion: 1.17 mmol/L (ref 1.12–1.23)
Chloride: 110 mEq/L (ref 96–112)
Creatinine, Ser: 0.9 mg/dL (ref 0.50–1.10)
Glucose, Bld: 101 mg/dL — ABNORMAL HIGH (ref 70–99)
HCT: 46 % (ref 36.0–46.0)
Hemoglobin: 15.6 g/dL — ABNORMAL HIGH (ref 12.0–15.0)
Potassium: 4.1 mEq/L (ref 3.5–5.1)
Sodium: 142 mEq/L (ref 135–145)
TCO2: 20 mmol/L (ref 0–100)

## 2012-12-15 MED ORDER — ONDANSETRON HCL 4 MG/2ML IJ SOLN
4.0000 mg | Freq: Once | INTRAMUSCULAR | Status: AC
Start: 2012-12-15 — End: 2012-12-15
  Administered 2012-12-15: 4 mg via INTRAVENOUS
  Filled 2012-12-15: qty 2

## 2012-12-15 MED ORDER — ACETAMINOPHEN 325 MG PO TABS
ORAL_TABLET | ORAL | Status: AC
  Filled 2012-12-15: qty 2

## 2012-12-15 MED ORDER — ONDANSETRON HCL 4 MG PO TABS: 4.0000 mg | ORAL_TABLET | Freq: Four times a day (QID) | ORAL | Status: DC

## 2012-12-15 MED ORDER — SODIUM CHLORIDE 0.9 % IV BOLUS (SEPSIS)
1000.0000 mL | Freq: Once | INTRAVENOUS | Status: AC
  Administered 2012-12-15: 1000 mL via INTRAVENOUS

## 2012-12-15 MED ORDER — HYDROMORPHONE HCL PF 1 MG/ML IJ SOLN
1.0000 mg | Freq: Once | INTRAMUSCULAR | Status: AC
  Administered 2012-12-15: 1 mg via INTRAVENOUS
  Filled 2012-12-15: qty 1

## 2012-12-15 NOTE — ED Provider Notes (Signed)
History    32 year old female with nausea, vomiting and diarrhea since early this morning. Crit B. diffuse abdominal pain. Patient's also complaining of a mild headache and fatigue. No fevers or chills. No sick contacts. No blood in her stool or emesis. No sick contacts. No urinary complaints. No unusual vaginal bleeding or discharge. Does not think she is pregnant. No intervention prior to arrival.  CSN: [id number]  Arrival date & time 12/15/12  2022   First MD Initiated Contact with Patient 12/15/12 2103      Chief Complaint  Patient presents with  . Emesis    (Consider location/radiation/quality/duration/timing/severity/associated sxs/prior treatment) HPI  Past Medical History  Diagnosis Date  . Depression   . Polycystic ovarian syndrome   . Obesity   . Gestational diabetes   . Reflux   . Anemia   . Anxiety   . Ulcer     Past Surgical History  Procedure Laterality Date  . Cesarean section      x3  . Tubal ligation      Family History  Problem Relation Age of Onset  . Throat cancer Maternal Aunt   . Cirrhosis Mother   . Diabetes Paternal Grandmother     History  Substance Use Topics  . Smoking status: Current Every Day Smoker -- 0.25 packs/day for 19 years    Types: Cigarettes  . Smokeless tobacco: Never Used  . Alcohol Use: No     Comment: occasionally    OB History   Grav Para Term Preterm Abortions TAB SAB Ect Mult Living   4 4 4  0 0 0 0 0 0 4      Review of Systems  All systems reviewed and negative, other than as noted in HPI.  Allergies  Latex  Home Medications   Current Outpatient Rx  Name  Route  Sig  Dispense  Refill  . fluocinonide (LIDEX) 0.05 % cream   Topical   Apply 1 application topically 2 (two) times daily as needed. For eczema         . dexlansoprazole (DEXILANT) 60 MG capsule   Oral   Take 60 mg by mouth 2 (two) times daily.           BP 108/72  Pulse 87  Temp(Src) 98.6 F (37 C)  Resp 20  SpO2 100%  LMP  11/09/2012  Physical Exam  Nursing note and vitals reviewed. Constitutional: She appears well-developed and well-nourished. No distress.  HENT:  Head: Normocephalic and atraumatic.  Eyes: Conjunctivae are normal. Right eye exhibits no discharge. Left eye exhibits no discharge.  Neck: Neck supple.  Cardiovascular: Normal rate, regular rhythm and normal heart sounds.  Exam reveals no gallop and no friction rub.   No murmur heard. Pulmonary/Chest: Effort normal and breath sounds normal. No respiratory distress.  Abdominal: Soft. She exhibits no distension. There is no tenderness.  Musculoskeletal: She exhibits no edema and no tenderness.  Neurological: She is alert.  Skin: Skin is warm and dry.  Psychiatric: She has a normal mood and affect. Her behavior is normal. Thought content normal.    ED Course  Procedures (including critical care time)  Labs Reviewed - No data to display No results found.   1. Nausea vomiting and diarrhea       MDM  32 year old female with nausea, vomiting diarrhea. Well-appearing on exam. Hemodynamically stable. Suspect viral illness. Her abdominal exam is benign. Basic labs obtained and unremarkable. Plan symptomatic treatment at this time. Emergent return precautions were  discussed the        Raeford Razor, MD 12/18/12 1432

## 2012-12-15 NOTE — ED Notes (Signed)
Vomiting/diarrhea since this am; headache

## 2013-01-20 ENCOUNTER — Inpatient Hospital Stay (HOSPITAL_COMMUNITY)
Admission: AD | Admit: 2013-01-20 | Discharge: 2013-01-20 | Disposition: A | Discharge status: Home / Self Care | Payer: Self-pay | Source: Ambulatory Visit | Attending: Obstetrics & Gynecology | Admitting: Obstetrics & Gynecology

## 2013-01-20 DIAGNOSIS — R109 Unspecified abdominal pain: Secondary | ICD-10-CM | POA: Insufficient documentation

## 2013-01-20 DIAGNOSIS — N946 Dysmenorrhea, unspecified: Secondary | ICD-10-CM | POA: Insufficient documentation

## 2013-01-20 LAB — CBC
Hemoglobin: 13.8 g/dL (ref 12.0–15.0)
MCH: 31.7 pg (ref 26.0–34.0)
MCHC: 35.8 g/dL (ref 30.0–36.0)
MCV: 88.3 fL (ref 78.0–100.0)
Platelets: 238 10*3/uL (ref 150–400)
RBC: 4.36 MIL/uL (ref 3.87–5.11)

## 2013-01-20 LAB — WET PREP, GENITAL

## 2013-01-20 LAB — HCG, QUANTITATIVE, PREGNANCY: hCG, Beta Chain, Quant, S: <1 m[IU]/mL (ref <5)

## 2013-01-20 MED ORDER — HYDROCODONE-ACETAMINOPHEN 5-325 MG PO TABS
1.0000 | ORAL_TABLET | Freq: Once | ORAL | Status: AC
  Administered 2013-01-20: 1 via ORAL
  Filled 2013-01-20: qty 1

## 2013-01-20 MED ORDER — KETOROLAC TROMETHAMINE 60 MG/2ML IM SOLN
60.0000 mg | Freq: Once | INTRAMUSCULAR | Status: AC
  Administered 2013-01-20: 60 mg via INTRAMUSCULAR
  Filled 2013-01-20: qty 2

## 2013-01-20 NOTE — MAU Provider Note (Signed)
History     CSN: [id number]  Arrival date and time: 01/20/13 0054   None     Chief Complaint  Patient presents with  . Abdominal Pain  . Vaginal Bleeding   HPI  Stacie Carrillo is a 32 y.o. [id number] who presents today with abdominal pain. She states the pain started this evening, and she tried Advil. She states it did not help. She is also having vaginal bleeding. It started this evening as well. She states the bleeding is normal for a period, but the cramping is much worse. She did miss her period last month.   Past Medical History  Diagnosis Date  . Depression   . Polycystic ovarian syndrome   . Obesity   . Gestational diabetes   . Reflux   . Anemia   . Anxiety   . Ulcer     Past Surgical History  Procedure Laterality Date  . Cesarean section      x3  . Tubal ligation      Family History  Problem Relation Age of Onset  . Throat cancer Maternal Aunt   . Cirrhosis Mother   . Diabetes Paternal Grandmother     History  Substance Use Topics  . Smoking status: Current Every Day Smoker -- 0.25 packs/day for 19 years    Types: Cigarettes  . Smokeless tobacco: Never Used  . Alcohol Use: No     Comment: occasionally    Allergies:  Allergies  Allergen Reactions  . Latex Other (See Comments)    Pt reports "eczema" with latex use    Prescriptions prior to admission  Medication Sig Dispense Refill  . dexlansoprazole (DEXILANT) 60 MG capsule Take 60 mg by mouth 2 (two) times daily.      . fluocinonide (LIDEX) 0.05 % cream Apply 1 application topically 2 (two) times daily as needed. For eczema      . ondansetron (ZOFRAN) 4 MG tablet Take 1 tablet (4 mg total) by mouth every 6 (six) hours.  12 tablet  0    Review of Systems  Constitutional: Negative for fever and chills.  Eyes: Negative for blurred vision.  Respiratory: Negative for shortness of breath.   Cardiovascular: Negative for chest pain.  Gastrointestinal: Positive for vomiting and abdominal pain.  Negative for nausea, diarrhea and constipation.  Genitourinary: Negative for dysuria, urgency and frequency.  Musculoskeletal: Negative for myalgias.  Neurological: Positive for headaches. Negative for dizziness.   Physical Exam   There were no vitals taken for this visit.  Physical Exam  Nursing note and vitals reviewed. Constitutional: She is oriented to person, place, and time. She appears well-developed and well-nourished. No distress.  Cardiovascular: Normal rate.   Respiratory: Effort normal.  GI: Soft. She exhibits no distension.  Genitourinary:   External: no lesions Vagina: small amount of blood seen in vault Cervix: pink, smooth, No CMT Uterus: AV, NSSC Adnexa: NT  Neurological: She is alert and oriented to person, place, and time.  Skin: Skin is warm and dry.  Psychiatric: She has a normal mood and affect.    MAU Course  Procedures  Results for orders placed during the hospital encounter of 01/20/13 (from the past 24 hour(s))  CBC     Status: Abnormal   Collection Time    01/20/13  1:00 AM      Result Value Range   WBC 12.5 (*) 4.0 - 10.5 K/uL   RBC 4.36  3.87 - 5.11 MIL/uL   Hemoglobin 13.8  12.0 -  15.0 g/dL   HCT 91.4  78.2 - 95.6 %   MCV 88.3  78.0 - 100.0 fL   MCH 31.7  26.0 - 34.0 pg   MCHC 35.8  30.0 - 36.0 g/dL   RDW 21.3  08.6 - 57.8 %   Platelets 238  150 - 400 K/uL  HCG, QUANTITATIVE, PREGNANCY     Status: None   Collection Time    01/20/13  1:00 AM      Result Value Range   hCG, Beta Chain, Quant, S <1  <5 mIU/mL     Assessment and Plan   1. Dysmenorrhea    Discussed the importance of FU with primary OBGYN for treatment plan Will keep menstrual log   Tawnya Crook 01/20/2013, 1:52 AM

## 2013-01-20 NOTE — MAU Note (Signed)
Pt states she had a positive pregnancy test in Dr Verdell Carmine office.Pt states she has a history of tubal ligation, and has not had any insurance to go for car.

## 2013-01-20 NOTE — MAU Provider Note (Signed)
Attestation of Attending Supervision of Advanced Practitioner (CNM/NP): Evaluation and management procedures were performed by the Advanced Practitioner under my supervision and collaboration.  I have reviewed the Advanced Practitioner's note and chart, and I agree with the management and plan.  HARRAWAY-SMITH, Genesi Stefanko 7:01 AM

## 2013-01-20 NOTE — MAU Note (Signed)
Pt states she started having vaginal bleeding about 1930.

## 2013-01-21 LAB — GC/CHLAMYDIA PROBE AMP: GC Probe RNA: NEGATIVE

## 2013-02-21 NOTE — MAU Note (Signed)
Chart audited for monthly QI 

## 2013-02-26 ENCOUNTER — Emergency Department (HOSPITAL_COMMUNITY)
Admission: EM | Admit: 2013-02-26 | Discharge: 2013-02-26 | Disposition: A | Discharge status: Home / Self Care | Payer: Self-pay | Attending: Emergency Medicine | Admitting: Emergency Medicine

## 2013-02-26 ENCOUNTER — Encounter (HOSPITAL_COMMUNITY): Payer: Self-pay | Admitting: *Deleted

## 2013-02-26 DIAGNOSIS — E669 Obesity, unspecified: Secondary | ICD-10-CM | POA: Insufficient documentation

## 2013-02-26 DIAGNOSIS — Z8632 Personal history of gestational diabetes: Secondary | ICD-10-CM | POA: Insufficient documentation

## 2013-02-26 DIAGNOSIS — R071 Chest pain on breathing: Secondary | ICD-10-CM | POA: Insufficient documentation

## 2013-02-26 DIAGNOSIS — Z91040 Latex allergy status: Secondary | ICD-10-CM | POA: Insufficient documentation

## 2013-02-26 DIAGNOSIS — Z8639 Personal history of other endocrine, nutritional and metabolic disease: Secondary | ICD-10-CM | POA: Insufficient documentation

## 2013-02-26 DIAGNOSIS — Z862 Personal history of diseases of the blood and blood-forming organs and certain disorders involving the immune mechanism: Secondary | ICD-10-CM | POA: Insufficient documentation

## 2013-02-26 DIAGNOSIS — R52 Pain, unspecified: Secondary | ICD-10-CM | POA: Insufficient documentation

## 2013-02-26 DIAGNOSIS — Z79899 Other long term (current) drug therapy: Secondary | ICD-10-CM | POA: Insufficient documentation

## 2013-02-26 DIAGNOSIS — M542 Cervicalgia: Secondary | ICD-10-CM | POA: Insufficient documentation

## 2013-02-26 DIAGNOSIS — Z872 Personal history of diseases of the skin and subcutaneous tissue: Secondary | ICD-10-CM | POA: Insufficient documentation

## 2013-02-26 DIAGNOSIS — K219 Gastro-esophageal reflux disease without esophagitis: Secondary | ICD-10-CM | POA: Insufficient documentation

## 2013-02-26 DIAGNOSIS — F17200 Nicotine dependence, unspecified, uncomplicated: Secondary | ICD-10-CM | POA: Insufficient documentation

## 2013-02-26 DIAGNOSIS — M549 Dorsalgia, unspecified: Secondary | ICD-10-CM | POA: Insufficient documentation

## 2013-02-26 DIAGNOSIS — Z8659 Personal history of other mental and behavioral disorders: Secondary | ICD-10-CM | POA: Insufficient documentation

## 2013-02-26 MED ORDER — HYDROCODONE-ACETAMINOPHEN 5-325 MG PO TABS
1.0000 | ORAL_TABLET | Freq: Once | ORAL | Status: AC
  Administered 2013-02-26: 1 via ORAL
  Filled 2013-02-26: qty 1

## 2013-02-26 MED ORDER — HYDROCODONE-ACETAMINOPHEN 5-325 MG PO TABS: 1.0000 | ORAL_TABLET | Freq: Three times a day (TID) | ORAL | Status: DC | PRN

## 2013-02-26 MED ORDER — DIAZEPAM 5 MG PO TABS
5.0000 mg | ORAL_TABLET | Freq: Once | ORAL | Status: AC
  Administered 2013-02-26: 5 mg via ORAL
  Filled 2013-02-26: qty 1

## 2013-02-26 MED ORDER — IBUPROFEN 800 MG PO TABS
800.0000 mg | ORAL_TABLET | Freq: Once | ORAL | Status: AC
  Administered 2013-02-26: 800 mg via ORAL
  Filled 2013-02-26: qty 1

## 2013-02-26 MED ORDER — DIAZEPAM 5 MG PO TABS: 5.0000 mg | ORAL_TABLET | Freq: Two times a day (BID) | ORAL | Status: DC | PRN

## 2013-02-26 MED ORDER — IBUPROFEN 800 MG PO TABS: 800.0000 mg | ORAL_TABLET | Freq: Three times a day (TID) | ORAL | Status: DC

## 2013-02-26 NOTE — ED Notes (Signed)
Pt c/o mid-back and neck pain radiating to R shoulder. Pt does manual labor as a housekeeper and believes she "pulled a muscle" while working today.

## 2013-02-26 NOTE — ED Notes (Signed)
Ice applied to pt's neck

## 2013-02-26 NOTE — ED Provider Notes (Signed)
History    CSN: [id number]  Arrival date & time 02/26/13  2122   None     Chief Complaint  Patient presents with  . Back Pain  . Neck Pain     The history is provided by the patient. No language interpreter was used.   HPI Comments: Stacie Carrillo is a 32 y.o. female who presents to the Emergency Department complaining of constant, moderate mid back pain with radiation into the right shoulder with an onset today. 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 Pain began several hours ago, after a strenuous motion. Since onset the pain has been focally about the right superior trapezius area, with radiation across the back. There is mild associated bilateral lower chest wall pain, but this is less substantial. No attempts relief with anything. No concurrent other complaints such as fever, cough, dyspnea, exertional lightheadedness or other chest pain. No nausea, no vomiting. The patient states that she has a long history of back pain, but this is different. She states that she typically has episodes of pain following strain is active at work.    Past Medical History  Diagnosis Date  . Depression   . Polycystic ovarian syndrome   . Obesity   . Gestational diabetes   . Reflux   . Anemia   . Anxiety   . Ulcer     Past Surgical History  Procedure Laterality Date  . Cesarean section      x3  . Tubal ligation      Family History  Problem Relation Age of Onset  . Throat cancer Maternal Aunt   . Cirrhosis Mother   . Diabetes Paternal Grandmother     History  Substance Use Topics  . Smoking status: Current Every Day Smoker -- 0.25 packs/day for 19 years    Types: Cigarettes  . Smokeless tobacco: Never Used  . Alcohol Use: No     Comment: occasionally    OB History   Grav Para Term Preterm Abortions TAB SAB Ect Mult Living   4 4 4  0 0 0 0 0 0 4      Review of Systems  Constitutional: Negative for fever and diaphoresis.  HENT:  Positive for neck pain. Negative for neck stiffness.   Eyes: Negative for visual disturbance.  Respiratory: Negative for apnea, chest tightness and shortness of breath.   Cardiovascular: Negative for chest pain and palpitations.  Gastrointestinal: Negative for nausea, vomiting, diarrhea and constipation.  Genitourinary: Negative for dysuria.  Musculoskeletal: Positive for back pain. Negative for gait problem.  Skin: Negative for rash.  Neurological: Negative for dizziness, weakness, light-headedness, numbness and headaches.  All other systems reviewed and are negative.    Allergies  Latex  Home Medications   Current Outpatient Rx  Name  Route  Sig  Dispense  Refill  . dexlansoprazole (DEXILANT) 60 MG capsule   Oral   Take 60 mg by mouth 2 (two) times daily.         . fluocinonide (LIDEX) 0.05 % cream   Topical   Apply 1 application topically 2 (two) times daily as needed. For eczema         . ondansetron (ZOFRAN) 4 MG tablet   Oral   Take 1 tablet (4 mg total) by mouth every 6 (six) hours.   12 tablet   0     BP 127/78  Pulse 75  Temp(Src) 97.7 F (36.5 C) (Oral)  Resp 19  Ht 5\' 5"  (1.651 m)  Wt 191 lb 12.8 oz (87 kg)  BMI 31.92 kg/m2  SpO2 97%  LMP 02/19/2013  Breastfeeding? No  Physical Exam  Nursing note and vitals reviewed. Constitutional: She is oriented to person, place, and time. She appears well-developed and well-nourished. No distress.  HENT:  Head: Normocephalic and atraumatic.  Eyes: Conjunctivae and EOM are normal.  Neck: Normal range of motion. Neck supple.  No meningeal signs  Cardiovascular: Normal rate, regular rhythm and normal heart sounds.   No murmur heard. Pulmonary/Chest: Effort normal and breath sounds normal. No respiratory distress. She has no wheezes. She has no rales. She exhibits no tenderness.  Musculoskeletal: Normal range of motion. She exhibits no edema and no tenderness.  No step-offs noted on C-spine No tenderness to  palpation of the spinous processes of the C-spine, T-spine Mild tenderness to palpation of the paraspinous muscles Normal strength in upper extremities The R shoulder has slightly diminished flexion, ~120d, w appropriate strength  Neurological: She is alert and oriented to person, place, and time. No cranial nerve deficit.  Speech is clear and goal oriented, follows commands  Moves extremities without ataxia, coordination intact Normal gait and balance  Skin: Skin is warm and dry. She is not diaphoretic. No erythema.    ED Course  Procedures (including critical care time)  DIAGNOSTIC STUDIES: Oxygen Saturation is 97% on room air, normal by my interpretation.    COORDINATION OF CARE:  Labs Reviewed - No data to display No results found.   No diagnosis found.    MDM  This generally well-appearing female presents with new shoulder, back pain. On exam the patient is afebrile, and in no distress, neurologically intact.  There is slight decrease in range of motion of the right shoulder, though the tenderness to palpation about the right trapezius area, her description of strenuous activity prior to the onset of pain is most consistent with musculoskeletal cause. The patient does smoke, but has minimal other risk factors for occult systemic acute process. We discussed at length analgesics, cryotherapy, need for additional evaluation and management, with consideration of physical therapy/occupational therapy/massage therapy.   Gerhard Munch, MD 02/26/13 2241

## 2013-06-11 ENCOUNTER — Emergency Department (HOSPITAL_COMMUNITY)
Admission: EM | Admit: 2013-06-11 | Discharge: 2013-06-11 | Disposition: A | Discharge status: Home / Self Care | Payer: Self-pay | Attending: Emergency Medicine | Admitting: Emergency Medicine

## 2013-06-11 ENCOUNTER — Encounter (HOSPITAL_COMMUNITY): Payer: Self-pay | Admitting: Emergency Medicine

## 2013-06-11 DIAGNOSIS — Z8639 Personal history of other endocrine, nutritional and metabolic disease: Secondary | ICD-10-CM | POA: Insufficient documentation

## 2013-06-11 DIAGNOSIS — Z91040 Latex allergy status: Secondary | ICD-10-CM | POA: Insufficient documentation

## 2013-06-11 DIAGNOSIS — Z8659 Personal history of other mental and behavioral disorders: Secondary | ICD-10-CM | POA: Insufficient documentation

## 2013-06-11 DIAGNOSIS — H109 Unspecified conjunctivitis: Secondary | ICD-10-CM | POA: Insufficient documentation

## 2013-06-11 DIAGNOSIS — F17200 Nicotine dependence, unspecified, uncomplicated: Secondary | ICD-10-CM | POA: Insufficient documentation

## 2013-06-11 DIAGNOSIS — Z8719 Personal history of other diseases of the digestive system: Secondary | ICD-10-CM | POA: Insufficient documentation

## 2013-06-11 DIAGNOSIS — Z872 Personal history of diseases of the skin and subcutaneous tissue: Secondary | ICD-10-CM | POA: Insufficient documentation

## 2013-06-11 DIAGNOSIS — Z8742 Personal history of other diseases of the female genital tract: Secondary | ICD-10-CM | POA: Insufficient documentation

## 2013-06-11 DIAGNOSIS — Z862 Personal history of diseases of the blood and blood-forming organs and certain disorders involving the immune mechanism: Secondary | ICD-10-CM | POA: Insufficient documentation

## 2013-06-11 DIAGNOSIS — E669 Obesity, unspecified: Secondary | ICD-10-CM | POA: Insufficient documentation

## 2013-06-11 MED ORDER — TOBRAMYCIN 0.3 % OP SOLN
1.0000 [drp] | OPHTHALMIC | Status: DC
  Administered 2013-06-11: 1 [drp] via OPHTHALMIC
  Filled 2013-06-11: qty 5

## 2013-06-11 MED ORDER — TETRACAINE HCL 0.5 % OP SOLN
2.0000 [drp] | Freq: Once | OPHTHALMIC | Status: AC
  Administered 2013-06-11: 2 [drp] via OPHTHALMIC
  Filled 2013-06-11: qty 2

## 2013-06-11 NOTE — ED Provider Notes (Signed)
Medical screening examination/treatment/procedure(s) were performed by non-physician practitioner and as supervising physician I was immediately available for consultation/collaboration.   Loren Racer, MD 06/11/13 1340

## 2013-06-11 NOTE — ED Notes (Signed)
Pt escorted to discharge window. Pt verbalized understanding discharge instructions. In no acute distress.  

## 2013-06-11 NOTE — ED Notes (Signed)
Pt states that she started having eyelid swelling yesterday.

## 2013-06-11 NOTE — ED Provider Notes (Signed)
  CSN: [id number]     Arrival date & time 06/11/13  4098 History     First MD Initiated Contact with Patient 06/11/13 [phone]     Chief Complaint  Patient presents with  . Eye Problem   (Consider location/radiation/quality/duration/timing/severity/associated sxs/prior Treatment) HPI Comments: Pt states that she felt a eyelash behind a contact yesterday and she had to leave it in for a while:pt states that she was at work so she had to leave it in for a while:pt states that the contact is not out but her eye lid is swollen and her eye is red:no definite feeling of fb:she has continued to rub the area because of agitation  Patient is a 32 y.o. female presenting with eye problem. The history is provided by the patient. No language interpreter was used.  Eye Problem Location:  L eye Quality:  Aching Severity:  Mild Onset quality:  Gradual Duration:  1 day Timing:  Constant Progression:  Unchanged Chronicity:  New Relieved by:  Nothing   Past Medical History  Diagnosis Date  . Depression   . Polycystic ovarian syndrome   . Obesity   . Gestational diabetes   . Reflux   . Anemia   . Anxiety   . Ulcer    Past Surgical History  Procedure Laterality Date  . Cesarean section      x3  . Tubal ligation     Family History  Problem Relation Age of Onset  . Throat cancer Maternal Aunt   . Cirrhosis Mother   . Diabetes Paternal Grandmother    History  Substance Use Topics  . Smoking status: Current Every Day Smoker -- 0.25 packs/day for 19 years    Types: Cigarettes  . Smokeless tobacco: Never Used  . Alcohol Use: No     Comment: occasionally   OB History   Grav Para Term Preterm Abortions TAB SAB Ect Mult Living   4 4 4  0 0 0 0 0 0 4     Review of Systems  Constitutional: Negative.   Respiratory: Negative.   Cardiovascular: Negative.     Allergies  Latex  Home Medications  No current outpatient prescriptions on file. BP 137/89  Pulse 97  Temp(Src) 98.6 F (37 C)  (Oral)  Resp 16  SpO2 100% Physical Exam  Nursing note and vitals reviewed. Constitutional: She is oriented to person, place, and time. She appears well-developed and well-nourished.  HENT:  Head: Normocephalic and atraumatic.  Eyes: EOM are normal. Pupils are equal, round, and reactive to light. Left conjunctiva is injected.  Slit lamp exam:      The left eye shows no corneal abrasion, no foreign body and no fluorescein uptake.  Mild swelling noted to the left eyelid:no fb noted under the lid  Cardiovascular: Normal rate and regular rhythm.   Pulmonary/Chest: Effort normal and breath sounds normal.  Musculoskeletal: Normal range of motion.  Neurological: She is alert and oriented to person, place, and time.    ED Course   Procedures (including critical care time)  Labs Reviewed - No data to display No results found. 1. Conjunctivitis     MDM  No sign of fb or abrasion:likely caused by rubbing with agitation related to contact yesterday:will treat with drops as pt is contact lens wearer  Teressa Lower, NP 06/11/13 1047

## 2013-06-14 ENCOUNTER — Encounter (HOSPITAL_COMMUNITY): Payer: Self-pay | Admitting: Emergency Medicine

## 2013-06-14 ENCOUNTER — Emergency Department (HOSPITAL_COMMUNITY)
Admission: EM | Admit: 2013-06-14 | Discharge: 2013-06-14 | Disposition: A | Discharge status: Home / Self Care | Payer: Self-pay | Attending: Emergency Medicine | Admitting: Emergency Medicine

## 2013-06-14 DIAGNOSIS — F3289 Other specified depressive episodes: Secondary | ICD-10-CM | POA: Insufficient documentation

## 2013-06-14 DIAGNOSIS — L02411 Cutaneous abscess of right axilla: Secondary | ICD-10-CM

## 2013-06-14 DIAGNOSIS — Z91040 Latex allergy status: Secondary | ICD-10-CM | POA: Insufficient documentation

## 2013-06-14 DIAGNOSIS — F17200 Nicotine dependence, unspecified, uncomplicated: Secondary | ICD-10-CM | POA: Insufficient documentation

## 2013-06-14 DIAGNOSIS — F329 Major depressive disorder, single episode, unspecified: Secondary | ICD-10-CM | POA: Insufficient documentation

## 2013-06-14 DIAGNOSIS — F411 Generalized anxiety disorder: Secondary | ICD-10-CM | POA: Insufficient documentation

## 2013-06-14 DIAGNOSIS — Z872 Personal history of diseases of the skin and subcutaneous tissue: Secondary | ICD-10-CM | POA: Insufficient documentation

## 2013-06-14 DIAGNOSIS — IMO0002 Reserved for concepts with insufficient information to code with codable children: Secondary | ICD-10-CM | POA: Insufficient documentation

## 2013-06-14 MED ORDER — DOXYCYCLINE HYCLATE 100 MG PO TABS: 100.0000 mg | ORAL_TABLET | Freq: Two times a day (BID) | ORAL | Status: DC

## 2013-06-14 MED ORDER — HYDROCODONE-ACETAMINOPHEN 5-325 MG PO TABS
1.0000 | ORAL_TABLET | Freq: Once | ORAL | Status: AC
  Administered 2013-06-14: 1 via ORAL
  Filled 2013-06-14: qty 1

## 2013-06-14 MED ORDER — HYDROCODONE-ACETAMINOPHEN 5-325 MG PO TABS: 1.0000 | ORAL_TABLET | Freq: Four times a day (QID) | ORAL | Status: DC | PRN

## 2013-06-14 MED ORDER — DOXYCYCLINE HYCLATE 100 MG PO TABS
100.0000 mg | ORAL_TABLET | Freq: Once | ORAL | Status: AC
  Administered 2013-06-14: 100 mg via ORAL
  Filled 2013-06-14: qty 1

## 2013-06-14 NOTE — ED Notes (Signed)
Pt c/o abscess to R axilla intermittent x 3 weeks.

## 2013-06-14 NOTE — ED Provider Notes (Signed)
Medical screening examination/treatment/procedure(s) were performed by non-physician practitioner and as supervising physician I was immediately available for consultation/collaboration.   Gilda Crease, MD 06/14/13 2153

## 2013-06-14 NOTE — ED Provider Notes (Signed)
This chart was scribed for Earley Favor NP, a non-physician practitioner working with Gilda Crease, * by Lewanda Rife, ED Scribe. This patient was seen in room WTR7/WTR7 and the patient's care was started at 2119.    CSN: [id number]     Arrival date & time 06/14/13  2050 History   First MD Initiated Contact with Patient 06/14/13 2109     Chief Complaint  Patient presents with  . Abscess   (Consider location/radiation/quality/duration/timing/severity/associated sxs/prior Treatment) The history is provided by the patient.   HPI Comments: Stacie Carrillo is a 32 y.o. female who presents to the Emergency Department complaining of moderately painful abscess on right axilla onset 3 weeks. Reports abscess waxes and wanes with use of warm compress. Denies associated fever. Reports aggravated by touch and alleviated with a heat compress. Reports hx of recurrent skin infections.     Past Medical History  Diagnosis Date  . Depression   . Polycystic ovarian syndrome   . Obesity   . Gestational diabetes   . Reflux   . Anemia   . Anxiety   . Ulcer    Past Surgical History  Procedure Laterality Date  . Cesarean section      x3  . Tubal ligation     Family History  Problem Relation Age of Onset  . Throat cancer Maternal Aunt   . Cirrhosis Mother   . Diabetes Paternal Grandmother    History  Substance Use Topics  . Smoking status: Current Every Day Smoker -- 0.25 packs/day for 19 years    Types: Cigarettes  . Smokeless tobacco: Never Used  . Alcohol Use: Yes     Comment: occasionally   OB History   Grav Para Term Preterm Abortions TAB SAB Ect Mult Living   4 4 4  0 0 0 0 0 0 4     Review of Systems  Constitutional: Negative for fever.  Skin:       Abscess on right axilla    A complete 10 system review of systems was obtained and all systems are negative except as noted in the HPI and PMH.    Allergies  Latex  Home Medications   Current Outpatient Rx   Name  Route  Sig  Dispense  Refill  . doxycycline (VIBRA-TABS) 100 MG tablet   Oral   Take 1 tablet (100 mg total) by mouth 2 (two) times daily.   13 tablet   0   . HYDROcodone-acetaminophen (NORCO/VICODIN) 5-325 MG per tablet   Oral   Take 1 tablet by mouth every 6 (six) hours as needed for pain.   12 tablet   0    BP 126/88  Pulse 108  Temp(Src) 98.1 F (36.7 C) (Oral)  Resp 18  Ht 5\' 5"  (1.651 m)  Wt 192 lb (87.091 kg)  BMI 31.95 kg/m2  SpO2 100%  LMP 05/19/2013 Physical Exam  Nursing note and vitals reviewed. Constitutional: She is oriented to person, place, and time. She appears well-developed and well-nourished. No distress.  HENT:  Head: Normocephalic and atraumatic.  Eyes: EOM are normal.  Neck: Neck supple. No tracheal deviation present.  Cardiovascular: Normal rate.   Pulmonary/Chest: Effort normal. No respiratory distress.  Musculoskeletal: Normal range of motion.  Neurological: She is alert and oriented to person, place, and time.  Skin: Skin is warm and dry.  4 cm by 3 cm abscess on right axilla. Fluctuance and erythema on center. No indication of surrounding cellulitis or erythema.  Psychiatric: She has a normal mood and affect. Her behavior is normal.    ED Course  INCISION AND DRAINAGE Date/Time: 06/14/2013 9:47 PM Performed by: Arman Filter Authorized by: Arman Filter Consent: Verbal consent not obtained. written consent not obtained. Risks and benefits: risks, benefits and alternatives were discussed Consent given by: patient Patient understanding: patient states understanding of the procedure being performed Patient identity confirmed: verbally with patient Time out: Immediately prior to procedure a "time out" was called to verify the correct patient, procedure, equipment, support staff and site/side marked as required. Type: abscess Body area: upper extremity Location details: right arm Anesthesia: local infiltration Local anesthetic:  lidocaine 1% with epinephrine Anesthetic total: 2 ml Patient sedated: no Scalpel size: 11 Needle gauge: 22 Incision type: single straight Complexity: simple Drainage: purulent Drainage amount: copious Packing material: 1/4 in iodoform gauze Comments: Abscess in right axilla   (including critical care time) Medications  HYDROcodone-acetaminophen (NORCO/VICODIN) 5-325 MG per tablet 1 tablet (1 tablet Oral Given 06/14/13 2151)  doxycycline (VIBRA-TABS) tablet 100 mg (100 mg Oral Given 06/14/13 2151)   Labs Review Labs Reviewed - No data to display Imaging Review No results found.  MDM   1. Abscess of axilla, right       I personally performed the services described in this documentation, which was scribed in my presence. The recorded information has been reviewed and is accurate.    Arman Filter, NP 06/14/13 2151

## 2013-08-15 ENCOUNTER — Emergency Department (INDEPENDENT_AMBULATORY_CARE_PROVIDER_SITE_OTHER)
Admission: EM | Admit: 2013-08-15 | Discharge: 2013-08-15 | Disposition: A | Discharge status: Home / Self Care | Payer: Self-pay | Source: Home / Self Care | Attending: Family Medicine | Admitting: Family Medicine

## 2013-08-15 ENCOUNTER — Encounter (HOSPITAL_COMMUNITY): Payer: Self-pay | Admitting: Emergency Medicine

## 2013-08-15 DIAGNOSIS — S161XXA Strain of muscle, fascia and tendon at neck level, initial encounter: Secondary | ICD-10-CM

## 2013-08-15 DIAGNOSIS — S139XXA Sprain of joints and ligaments of unspecified parts of neck, initial encounter: Secondary | ICD-10-CM

## 2013-08-15 MED ORDER — INFLUENZA VAC SPLIT QUAD 0.5 ML IM SUSP
0.5000 mL | INTRAMUSCULAR | Status: AC
  Administered 2013-08-15: 0.5 mL via INTRAMUSCULAR

## 2013-08-15 MED ORDER — DICLOFENAC POTASSIUM 50 MG PO TABS: 50.0000 mg | ORAL_TABLET | Freq: Three times a day (TID) | ORAL | Status: DC

## 2013-08-15 MED ORDER — CLINDAMYCIN HCL 300 MG PO CAPS: 300.0000 mg | ORAL_CAPSULE | Freq: Three times a day (TID) | ORAL | Status: DC

## 2013-08-15 MED ORDER — CYCLOBENZAPRINE HCL 5 MG PO TABS: 5.0000 mg | ORAL_TABLET | Freq: Three times a day (TID) | ORAL | Status: DC | PRN

## 2013-08-15 MED ORDER — INFLUENZA VAC SPLIT QUAD 0.5 ML IM SUSP
INTRAMUSCULAR | Status: AC
  Filled 2013-08-15: qty 0.5

## 2013-08-15 NOTE — ED Provider Notes (Addendum)
CSN: [id number]     Arrival date & time 08/15/13  1055 History   None    Chief Complaint  Patient presents with  . Shoulder Pain   (Consider location/radiation/quality/duration/timing/severity/associated sxs/prior Treatment) Patient is a 32 y.o. female presenting with shoulder pain. The history is provided by the patient.  Shoulder Pain This is a recurrent problem. The current episode started 2 days ago. The problem has been gradually worsening. Pertinent negatives include no chest pain, no abdominal pain, no headaches and no shortness of breath.    Past Medical History  Diagnosis Date  . Depression   . Polycystic ovarian syndrome   . Obesity   . Gestational diabetes   . Reflux   . Anemia   . Anxiety   . Ulcer    Past Surgical History  Procedure Laterality Date  . Cesarean section      x3  . Tubal ligation     Family History  Problem Relation Age of Onset  . Throat cancer Maternal Aunt   . Cirrhosis Mother   . Diabetes Paternal Grandmother    History  Substance Use Topics  . Smoking status: Current Every Day Smoker -- 0.25 packs/day for 19 years    Types: Cigarettes  . Smokeless tobacco: Never Used  . Alcohol Use: Yes     Comment: occasionally   OB History   Grav Para Term Preterm Abortions TAB SAB Ect Mult Living   4 4 4  0 0 0 0 0 0 4     Review of Systems  Respiratory: Negative for shortness of breath.   Cardiovascular: Negative for chest pain.  Gastrointestinal: Negative for abdominal pain.  Musculoskeletal: Positive for myalgias and neck pain.  Skin: Negative.   Neurological: Negative for headaches.    Allergies  Latex  Home Medications   Current Outpatient Rx  Name  Route  Sig  Dispense  Refill  . clindamycin (CLEOCIN) 300 MG capsule   Oral   Take 1 capsule (300 mg total) by mouth 3 (three) times daily.   21 capsule   0   . cyclobenzaprine (FLEXERIL) 5 MG tablet   Oral   Take 1 tablet (5 mg total) by mouth 3 (three) times daily as needed  for muscle spasms.   30 tablet   0   . diclofenac (CATAFLAM) 50 MG tablet   Oral   Take 1 tablet (50 mg total) by mouth 3 (three) times daily. For neck pain   30 tablet   0   . doxycycline (VIBRA-TABS) 100 MG tablet   Oral   Take 1 tablet (100 mg total) by mouth 2 (two) times daily.   13 tablet   0   . HYDROcodone-acetaminophen (NORCO/VICODIN) 5-325 MG per tablet   Oral   Take 1 tablet by mouth every 6 (six) hours as needed for pain.   12 tablet   0    BP 129/82  Pulse 75  Temp(Src) 98.6 F (37 C) (Oral)  Resp 17  SpO2 100%  LMP 07/25/2013 Physical Exam  Nursing note and vitals reviewed. Constitutional: She is oriented to person, place, and time. She appears well-developed and well-nourished.  HENT:  Head: Normocephalic.  Right Ear: External ear normal.  Left Ear: External ear normal.  Neck: Normal range of motion and full passive range of motion without pain. Neck supple. Muscular tenderness present. No spinous process tenderness present.  Lymphadenopathy:    She has no cervical adenopathy.  Neurological: She is alert and oriented  to person, place, and time.  Skin: Skin is warm and dry.    ED Course  Procedures (including critical care time) Labs Review Labs Reviewed - No data to display Imaging Review No results found.    MDM  Also requesting abx for dental problem.    Linna Hoff, MD 08/15/13 1230  Linna Hoff, MD 08/15/13 [phone]

## 2013-08-15 NOTE — ED Notes (Signed)
Med  Arm   Sling  Applied    

## 2013-08-15 NOTE — ED Notes (Signed)
Pt  Reports  Pain  In neck   And  r  Shoulder     X  sev  Days         denys  Any  specefic  Injury but  Reports  Does  Lifting  At  Work  -  rom is  Present               She is  Sitting  Upright on  Exam table    Speaking in complete  sentances  And  Is  In  No  Acute  Distress

## 2013-08-22 ENCOUNTER — Emergency Department (HOSPITAL_COMMUNITY)
Admission: EM | Admit: 2013-08-22 | Discharge: 2013-08-23 | Disposition: A | Discharge status: Home / Self Care | Payer: Medicaid Other | Attending: Emergency Medicine | Admitting: Emergency Medicine

## 2013-08-22 DIAGNOSIS — Z862 Personal history of diseases of the blood and blood-forming organs and certain disorders involving the immune mechanism: Secondary | ICD-10-CM | POA: Insufficient documentation

## 2013-08-22 DIAGNOSIS — E669 Obesity, unspecified: Secondary | ICD-10-CM | POA: Insufficient documentation

## 2013-08-22 DIAGNOSIS — R112 Nausea with vomiting, unspecified: Secondary | ICD-10-CM | POA: Insufficient documentation

## 2013-08-22 DIAGNOSIS — F17200 Nicotine dependence, unspecified, uncomplicated: Secondary | ICD-10-CM | POA: Insufficient documentation

## 2013-08-22 DIAGNOSIS — R6883 Chills (without fever): Secondary | ICD-10-CM | POA: Insufficient documentation

## 2013-08-22 DIAGNOSIS — Z8659 Personal history of other mental and behavioral disorders: Secondary | ICD-10-CM | POA: Insufficient documentation

## 2013-08-22 DIAGNOSIS — H53149 Visual discomfort, unspecified: Secondary | ICD-10-CM | POA: Insufficient documentation

## 2013-08-22 DIAGNOSIS — Z8719 Personal history of other diseases of the digestive system: Secondary | ICD-10-CM | POA: Insufficient documentation

## 2013-08-22 DIAGNOSIS — R51 Headache: Secondary | ICD-10-CM | POA: Insufficient documentation

## 2013-08-22 DIAGNOSIS — Z872 Personal history of diseases of the skin and subcutaneous tissue: Secondary | ICD-10-CM | POA: Insufficient documentation

## 2013-08-22 DIAGNOSIS — Z8632 Personal history of gestational diabetes: Secondary | ICD-10-CM | POA: Insufficient documentation

## 2013-08-22 DIAGNOSIS — Z91040 Latex allergy status: Secondary | ICD-10-CM | POA: Insufficient documentation

## 2013-08-23 ENCOUNTER — Encounter (HOSPITAL_COMMUNITY): Payer: Self-pay | Admitting: Emergency Medicine

## 2013-08-23 MED ORDER — ONDANSETRON HCL 4 MG/2ML IJ SOLN
4.0000 mg | Freq: Once | INTRAMUSCULAR | Status: AC
  Administered 2013-08-23: 4 mg via INTRAVENOUS
  Filled 2013-08-23: qty 2

## 2013-08-23 MED ORDER — DIPHENHYDRAMINE HCL 50 MG/ML IJ SOLN
25.0000 mg | Freq: Once | INTRAMUSCULAR | Status: AC
  Administered 2013-08-23: 25 mg via INTRAVENOUS
  Filled 2013-08-23: qty 1

## 2013-08-23 MED ORDER — METOCLOPRAMIDE HCL 5 MG/ML IJ SOLN
10.0000 mg | Freq: Once | INTRAMUSCULAR | Status: AC
  Administered 2013-08-23: 10 mg via INTRAVENOUS
  Filled 2013-08-23: qty 2

## 2013-08-23 MED ORDER — KETOROLAC TROMETHAMINE 30 MG/ML IJ SOLN
30.0000 mg | Freq: Once | INTRAMUSCULAR | Status: AC
  Administered 2013-08-23: 30 mg via INTRAVENOUS
  Filled 2013-08-23: qty 1

## 2013-08-23 MED ORDER — SODIUM CHLORIDE 0.9 % IV SOLN
Freq: Once | INTRAVENOUS | Status: AC
  Administered 2013-08-23: 01:00:00 via INTRAVENOUS

## 2013-08-23 NOTE — ED Provider Notes (Signed)
CSN: [id number]     Arrival date & time 08/22/13  2358 History   First MD Initiated Contact with Patient 08/23/13 0014     Chief Complaint  Patient presents with  . Migraine   (Consider location/radiation/quality/duration/timing/severity/associated sxs/prior Treatment) HPI Comments: Patient is a 32 year old female who presents for a headache with onset this evening. Patient states the headache came on gradually and is located in her forehead and radiates diffusely throughout her head. She describes the pain as throbbing in nature and states has been gradually worsening since onset. Patient took ibuprofen for her symptoms without relief. She endorses associated photophobia, nausea, nonbloody/nonbilious emesis x1, and a sensation of chills. She states that she has had headaches like this in the past with similar associated symptoms. She denies any head injury or trauma. Patient denies associated fever, tinnitus or hearing loss, vision loss, difficulty speaking or swallowing, neck pain or stiffness, shortness of breath, chest pain, numbness or tingling, and extremity weakness.  Patient is a 32 y.o. female presenting with migraines. The history is provided by the patient. No language interpreter was used.  Migraine Associated symptoms include headaches, nausea and vomiting. Pertinent negatives include no fever, neck pain, numbness or weakness.    Past Medical History  Diagnosis Date  . Depression   . Polycystic ovarian syndrome   . Obesity   . Gestational diabetes   . Reflux   . Anemia   . Anxiety   . Ulcer    Past Surgical History  Procedure Laterality Date  . Cesarean section      x3  . Tubal ligation     Family History  Problem Relation Age of Onset  . Throat cancer Maternal Aunt   . Cirrhosis Mother   . Diabetes Paternal Grandmother    History  Substance Use Topics  . Smoking status: Current Every Day Smoker -- 0.25 packs/day for 19 years    Types: Cigarettes  . Smokeless  tobacco: Never Used  . Alcohol Use: Yes     Comment: occasionally   OB History   Grav Para Term Preterm Abortions TAB SAB Ect Mult Living   4 4 4  0 0 0 0 0 0 4     Review of Systems  Constitutional: Negative for fever.  Eyes: Positive for photophobia.  Gastrointestinal: Positive for nausea and vomiting.  Musculoskeletal: Negative for neck pain and neck stiffness.  Neurological: Positive for headaches. Negative for syncope, weakness and numbness.  All other systems reviewed and are negative.    Allergies  Latex  Home Medications   Current Outpatient Rx  Name  Route  Sig  Dispense  Refill  . cyclobenzaprine (FLEXERIL) 5 MG tablet   Oral   Take 1 tablet (5 mg total) by mouth 3 (three) times daily as needed for muscle spasms.   30 tablet   0   . diclofenac (CATAFLAM) 50 MG tablet   Oral   Take 1 tablet (50 mg total) by mouth 3 (three) times daily. For neck pain   30 tablet   0    BP 114/72  Pulse 71  Temp(Src) 97.7 F (36.5 C) (Oral)  Resp 18  SpO2 98%  LMP 08/22/2013  Physical Exam  Nursing note and vitals reviewed. Constitutional: She is oriented to person, place, and time. She appears well-developed and well-nourished. No distress.  HENT:  Head: Normocephalic and atraumatic.  Mouth/Throat: Oropharynx is clear and moist. No oropharyngeal exudate.  Eyes: Conjunctivae and EOM are normal. Pupils are equal,  round, and reactive to light. No scleral icterus.  Neck: Normal range of motion. Neck supple.  No meningismus or nuchal rigidity  Cardiovascular: Normal rate, regular rhythm and normal heart sounds.   Pulmonary/Chest: Effort normal. No respiratory distress. She has no wheezes. She has no rales.  Musculoskeletal: Normal range of motion.  Neurological: She is alert and oriented to person, place, and time. She has normal strength and normal reflexes. No cranial nerve deficit or sensory deficit. GCS eye subscore is 4. GCS verbal subscore is 5. GCS motor subscore is  6.  Patient speaks in full goal oriented sentences. CN III-XII grossly intact. No sensory or motor deficits appreciated and patient moves extremities without ataxia.  Skin: Skin is warm and dry. No rash noted. She is not diaphoretic. No erythema. No pallor.  Psychiatric: She has a normal mood and affect. Her behavior is normal.    ED Course  Procedures (including critical care time) Labs Review Labs Reviewed - No data to display Imaging Review No results found.  EKG Interpretation   None       MDM   1. Headache    32 year old female presents for a headache with onset this evening. Patient well and nontoxic appearing, hemodynamically stable, and afebrile. No meningismus or nuchal rigidity appreciated. Neurologic exam nonfocal. Patient versus similar headaches in the past with same associated symptoms. No head injury or trauma. Symptoms treated with Toradol, Reglan, and Benadryl with complete relief of headache symptoms. Patient wishing to further manage symptoms at home. She is stable and appropriate for discharge with primary care followup as needed. Return precautions provided and patient agreeable to plan with no unaddressed concerns.    Antony Madura, PA-C 08/23/13 2101

## 2013-08-23 NOTE — ED Notes (Signed)
Pt resting quietly--- easily aroused from rest, states, "Headache is gone". Pt also verbalized that she's ready to go home.

## 2013-08-23 NOTE — ED Notes (Signed)
Pt had a migraine this evening, ate dinner went to bed and woke up vomiting

## 2013-08-29 NOTE — ED Provider Notes (Signed)
Medical screening examination/treatment/procedure(s) were performed by non-physician practitioner and as supervising physician I was immediately available for consultation/collaboration.    Emya Picado M Adael Culbreath, MD 08/29/13 1803 

## 2013-09-02 ENCOUNTER — Emergency Department (HOSPITAL_COMMUNITY): Payer: Medicaid Other

## 2013-09-02 ENCOUNTER — Encounter (HOSPITAL_COMMUNITY): Payer: Self-pay | Admitting: Emergency Medicine

## 2013-09-02 ENCOUNTER — Emergency Department (HOSPITAL_COMMUNITY)
Admission: EM | Admit: 2013-09-02 | Discharge: 2013-09-02 | Disposition: A | Discharge status: Home / Self Care | Payer: Medicaid Other | Attending: Emergency Medicine | Admitting: Emergency Medicine

## 2013-09-02 DIAGNOSIS — Z872 Personal history of diseases of the skin and subcutaneous tissue: Secondary | ICD-10-CM | POA: Insufficient documentation

## 2013-09-02 DIAGNOSIS — E669 Obesity, unspecified: Secondary | ICD-10-CM | POA: Insufficient documentation

## 2013-09-02 DIAGNOSIS — F17200 Nicotine dependence, unspecified, uncomplicated: Secondary | ICD-10-CM | POA: Insufficient documentation

## 2013-09-02 DIAGNOSIS — Y92410 Unspecified street and highway as the place of occurrence of the external cause: Secondary | ICD-10-CM | POA: Insufficient documentation

## 2013-09-02 DIAGNOSIS — S0990XA Unspecified injury of head, initial encounter: Secondary | ICD-10-CM | POA: Insufficient documentation

## 2013-09-02 DIAGNOSIS — Z862 Personal history of diseases of the blood and blood-forming organs and certain disorders involving the immune mechanism: Secondary | ICD-10-CM | POA: Insufficient documentation

## 2013-09-02 DIAGNOSIS — S0993XA Unspecified injury of face, initial encounter: Secondary | ICD-10-CM | POA: Insufficient documentation

## 2013-09-02 DIAGNOSIS — F411 Generalized anxiety disorder: Secondary | ICD-10-CM | POA: Insufficient documentation

## 2013-09-02 DIAGNOSIS — Z8719 Personal history of other diseases of the digestive system: Secondary | ICD-10-CM | POA: Insufficient documentation

## 2013-09-02 DIAGNOSIS — R091 Pleurisy: Secondary | ICD-10-CM | POA: Insufficient documentation

## 2013-09-02 DIAGNOSIS — S298XXA Other specified injuries of thorax, initial encounter: Secondary | ICD-10-CM | POA: Insufficient documentation

## 2013-09-02 DIAGNOSIS — Z91040 Latex allergy status: Secondary | ICD-10-CM | POA: Insufficient documentation

## 2013-09-02 DIAGNOSIS — Y9389 Activity, other specified: Secondary | ICD-10-CM | POA: Insufficient documentation

## 2013-09-02 DIAGNOSIS — S8990XA Unspecified injury of unspecified lower leg, initial encounter: Secondary | ICD-10-CM | POA: Insufficient documentation

## 2013-09-02 DIAGNOSIS — IMO0002 Reserved for concepts with insufficient information to code with codable children: Secondary | ICD-10-CM | POA: Insufficient documentation

## 2013-09-02 MED ORDER — HYDROCODONE-ACETAMINOPHEN 5-325 MG PO TABS: 2.0000 | ORAL_TABLET | Freq: Four times a day (QID) | ORAL | Status: DC | PRN

## 2013-09-02 MED ORDER — HYDROCODONE-ACETAMINOPHEN 5-325 MG PO TABS
2.0000 | ORAL_TABLET | Freq: Once | ORAL | Status: AC
  Administered 2013-09-02: 2 via ORAL
  Filled 2013-09-02: qty 2
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
## 2013-09-02 NOTE — ED Provider Notes (Signed)
Medical screening examination/treatment/procedure(s) were performed by non-physician practitioner and as supervising physician I was immediately available for consultation/collaboration.  EKG Interpretation   None         Kyle Luppino H Shea Kapur, MD 09/02/13 1557 
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
## 2013-10-06 ENCOUNTER — Encounter (HOSPITAL_COMMUNITY): Payer: Self-pay | Admitting: Emergency Medicine

## 2013-10-06 ENCOUNTER — Emergency Department (HOSPITAL_COMMUNITY)
Admission: EM | Admit: 2013-10-06 | Discharge: 2013-10-06 | Disposition: A | Discharge status: Home / Self Care | Payer: Medicaid Other | Attending: Emergency Medicine | Admitting: Emergency Medicine

## 2013-10-06 DIAGNOSIS — Z8719 Personal history of other diseases of the digestive system: Secondary | ICD-10-CM | POA: Insufficient documentation

## 2013-10-06 DIAGNOSIS — F17200 Nicotine dependence, unspecified, uncomplicated: Secondary | ICD-10-CM | POA: Insufficient documentation

## 2013-10-06 DIAGNOSIS — Z862 Personal history of diseases of the blood and blood-forming organs and certain disorders involving the immune mechanism: Secondary | ICD-10-CM | POA: Insufficient documentation

## 2013-10-06 DIAGNOSIS — E669 Obesity, unspecified: Secondary | ICD-10-CM | POA: Insufficient documentation

## 2013-10-06 DIAGNOSIS — IMO0002 Reserved for concepts with insufficient information to code with codable children: Secondary | ICD-10-CM | POA: Insufficient documentation

## 2013-10-06 DIAGNOSIS — Z8632 Personal history of gestational diabetes: Secondary | ICD-10-CM | POA: Insufficient documentation

## 2013-10-06 DIAGNOSIS — Z8659 Personal history of other mental and behavioral disorders: Secondary | ICD-10-CM | POA: Insufficient documentation

## 2013-10-06 DIAGNOSIS — Z8742 Personal history of other diseases of the female genital tract: Secondary | ICD-10-CM | POA: Insufficient documentation

## 2013-10-06 DIAGNOSIS — Z91040 Latex allergy status: Secondary | ICD-10-CM | POA: Insufficient documentation

## 2013-10-06 DIAGNOSIS — R51 Headache: Secondary | ICD-10-CM | POA: Insufficient documentation

## 2013-10-06 DIAGNOSIS — L03111 Cellulitis of right axilla: Secondary | ICD-10-CM

## 2013-10-06 LAB — GLUCOSE, CAPILLARY: Glucose-Capillary: 129 mg/dL — ABNORMAL HIGH (ref 70–99)

## 2013-10-06 MED ORDER — HYDROCODONE-ACETAMINOPHEN 5-325 MG PO TABS
2.0000 | ORAL_TABLET | Freq: Once | ORAL | Status: AC
  Administered 2013-10-06: 2 via ORAL
  Filled 2013-10-06: qty 2

## 2013-10-06 MED ORDER — HYDROCODONE-ACETAMINOPHEN 5-325 MG PO TABS: 1.0000 | ORAL_TABLET | ORAL | Status: DC | PRN

## 2013-10-06 MED ORDER — DOXYCYCLINE HYCLATE 100 MG PO CAPS: 100.0000 mg | ORAL_CAPSULE | Freq: Two times a day (BID) | ORAL | Status: DC

## 2013-10-06 NOTE — ED Notes (Signed)
Pt has a ride home.  

## 2013-10-06 NOTE — ED Notes (Signed)
Pt c/o boil to R axillary area x 3 days, pt states she has tried warm compresses without success. Pt denies drainage

## 2013-10-06 NOTE — ED Provider Notes (Signed)
Medical screening examination/treatment/procedure(s) were performed by non-physician practitioner and as supervising physician I was immediately available for consultation/collaboration.  EKG Interpretation   None         Dagmar Hait, MD 10/06/13 2242

## 2013-10-06 NOTE — ED Provider Notes (Signed)
CSN: [id number]     Arrival date & time 10/06/13  1913 History  This chart was scribed for Ruby Cola, PA-C, working with Dagmar Hait, MD by Blanchard Kelch, ED Scribe. This patient was seen in room WTR6/WTR6 and the patient's care was started at 9:29 PM.    Chief Complaint  Patient presents with  . Abscess   Patient is a 32 y.o. female presenting with abscess. The history is provided by the patient. No language interpreter was used.  Abscess Associated symptoms: fever and headaches     HPI Comments: Stacie Carrillo is a 32 y.o. female who presents to the Emergency Department complaining of a constant, worsening abscesses on her right axillary that appeared three days ago. She has constant, severe pain to the affected area that she describes as sore. She has been using hot compresses on the area without relief. She states that she has a history of abscesses that always occur in the axillary. Her last abscess to the area was two or three months ago. She reports chills, headache and subjective fever. She has a history of gestational diabetes but denies that she has ever been diagnosed with diabetes otherwise. She has not had her blood sugar checked recently.   Past Medical History  Diagnosis Date  . Depression   . Polycystic ovarian syndrome   . Obesity   . Gestational diabetes   . Reflux   . Anemia   . Anxiety   . Ulcer    Past Surgical History  Procedure Laterality Date  . Cesarean section      x3  . Tubal ligation     Family History  Problem Relation Age of Onset  . Throat cancer Maternal Aunt   . Cirrhosis Mother   . Diabetes Paternal Grandmother    History  Substance Use Topics  . Smoking status: Current Every Day Smoker -- 0.25 packs/day for 19 years    Types: Cigarettes  . Smokeless tobacco: Never Used  . Alcohol Use: Yes     Comment: occasionally   OB History   Grav Para Term Preterm Abortions TAB SAB Ect Mult Living   4 4 4  0 0 0 0 0 0 4      Review of Systems  Constitutional: Positive for fever and chills.  Skin:       Positive for abscess.  Neurological: Positive for headaches.    Allergies  Latex  Home Medications   No current outpatient prescriptions on file. Triage Vitals: BP 118/80  Pulse 107  Temp(Src) 99 F (37.2 C) (Oral)  Resp 18  Ht 5\' 5"  (1.651 m)  Wt 192 lb (87.091 kg)  BMI 31.95 kg/m2  SpO2 99%  LMP 09/18/2013  Physical Exam  Nursing note and vitals reviewed. Constitutional: She is oriented to person, place, and time. She appears well-developed and well-nourished. No distress.  HENT:  Head: Normocephalic and atraumatic.  Eyes:  Normal appearance  Neck: Normal range of motion.  Pulmonary/Chest: Effort normal.  Musculoskeletal: Normal range of motion.  Neurological: She is alert and oriented to person, place, and time.  Skin:  2cm abscess w/ surrounding induration, no erythema, in right axilla.  Severely ttp.  Psychiatric: She has a normal mood and affect. Her behavior is normal.    ED Course  Procedures (including critical care time)  DIAGNOSTIC STUDIES: Oxygen Saturation is 99% on room air, normal by my interpretation.    COORDINATION OF CARE: 9:33 PM -Will perform I&D. Patient verbalizes understanding  and agrees with treatment plan.  INCISION AND DRAINAGE 9:38 PM Performed by: Ruby Cola, PA-C Consent: Verbal consent obtained. Risks and benefits: risks, benefits and alternatives were discussed Type: abscess  Body area: R axilla  Anesthesia: local infiltration  Incision was made with a scalpel.  Local anesthetic: lidocaine 2% w/ epinephrine  Anesthetic total: 5 ml  Complexity: complex Blunt dissection to break up loculations  Drainage: none  Drainage amount: none  No packing.  Patient tolerance: Patient tolerated the procedure well with no immediate complications.      Labs Review Labs Reviewed - No data to display Imaging Review No results  found.  EKG Interpretation   None       MDM   1. Cellulitis of right axilla    32yo F presents w/ R axillary pain/edema.  Exam consistent w/ abscess but no drainage w/ I&D.  Prescribed doxy, which she has taken for recurrent abscess in the past, as well as vicodin, and referred to General Surgery.  Cbg 129 today. Return precautions discussed. 10:04 PM   I personally performed the services described in this documentation, which was scribed in my presence. The recorded information has been reviewed and is accurate.    Otilio Miu, PA-C 10/06/13 2209

## 2013-10-08 ENCOUNTER — Inpatient Hospital Stay (HOSPITAL_COMMUNITY)
Admission: EM | Admit: 2013-10-08 | Discharge: 2013-10-11 | DRG: 572 | Disposition: A | Discharge status: Home Health | Payer: Medicaid Other | Attending: Internal Medicine | Admitting: Internal Medicine

## 2013-10-08 ENCOUNTER — Encounter (HOSPITAL_COMMUNITY): Payer: Self-pay | Admitting: Emergency Medicine

## 2013-10-08 DIAGNOSIS — E669 Obesity, unspecified: Secondary | ICD-10-CM

## 2013-10-08 DIAGNOSIS — L02411 Cutaneous abscess of right axilla: Secondary | ICD-10-CM | POA: Diagnosis present

## 2013-10-08 DIAGNOSIS — F411 Generalized anxiety disorder: Secondary | ICD-10-CM | POA: Diagnosis present

## 2013-10-08 DIAGNOSIS — R197 Diarrhea, unspecified: Secondary | ICD-10-CM | POA: Diagnosis present

## 2013-10-08 DIAGNOSIS — L732 Hidradenitis suppurativa: Secondary | ICD-10-CM

## 2013-10-08 DIAGNOSIS — M79609 Pain in unspecified limb: Secondary | ICD-10-CM | POA: Diagnosis present

## 2013-10-08 DIAGNOSIS — R509 Fever, unspecified: Secondary | ICD-10-CM | POA: Diagnosis present

## 2013-10-08 DIAGNOSIS — L03111 Cellulitis of right axilla: Secondary | ICD-10-CM

## 2013-10-08 DIAGNOSIS — F329 Major depressive disorder, single episode, unspecified: Secondary | ICD-10-CM | POA: Diagnosis present

## 2013-10-08 DIAGNOSIS — R110 Nausea: Secondary | ICD-10-CM | POA: Diagnosis present

## 2013-10-08 DIAGNOSIS — Z6832 Body mass index (BMI) 32.0-32.9, adult: Secondary | ICD-10-CM

## 2013-10-08 DIAGNOSIS — Z8632 Personal history of gestational diabetes: Secondary | ICD-10-CM

## 2013-10-08 DIAGNOSIS — L0390 Cellulitis, unspecified: Secondary | ICD-10-CM | POA: Diagnosis present

## 2013-10-08 DIAGNOSIS — IMO0002 Reserved for concepts with insufficient information to code with codable children: Principal | ICD-10-CM | POA: Diagnosis present

## 2013-10-08 DIAGNOSIS — K219 Gastro-esophageal reflux disease without esophagitis: Secondary | ICD-10-CM

## 2013-10-08 DIAGNOSIS — Z833 Family history of diabetes mellitus: Secondary | ICD-10-CM

## 2013-10-08 DIAGNOSIS — F3289 Other specified depressive episodes: Secondary | ICD-10-CM | POA: Diagnosis present

## 2013-10-08 DIAGNOSIS — F17200 Nicotine dependence, unspecified, uncomplicated: Secondary | ICD-10-CM

## 2013-10-08 DIAGNOSIS — K222 Esophageal obstruction: Secondary | ICD-10-CM

## 2013-10-08 DIAGNOSIS — E282 Polycystic ovarian syndrome: Secondary | ICD-10-CM

## 2013-10-08 DIAGNOSIS — D649 Anemia, unspecified: Secondary | ICD-10-CM

## 2013-10-08 LAB — POCT I-STAT, CHEM 8
Calcium, Ion: 1.13 mmol/L (ref 1.12–1.23)
Creatinine, Ser: 0.7 mg/dL (ref 0.50–1.10)
Glucose, Bld: 91 mg/dL (ref 70–99)
HCT: 44 % (ref 36.0–46.0)
Hemoglobin: 15 g/dL (ref 12.0–15.0)
Potassium: 4.8 mEq/L (ref 3.5–5.1)
TCO2: 24 mmol/L (ref 0–100)

## 2013-10-08 LAB — CBC WITH DIFFERENTIAL/PLATELET
Basophils Absolute: 0 10*3/uL (ref 0.0–0.1)
Basophils Relative: 0 % (ref 0–1)
Eosinophils Absolute: 0.1 10*3/uL (ref 0.0–0.7)
Eosinophils Relative: 1 % (ref 0–5)
Hemoglobin: 14 g/dL (ref 12.0–15.0)
MCH: 32.7 pg (ref 26.0–34.0)
MCHC: 36.4 g/dL — ABNORMAL HIGH (ref 30.0–36.0)
MCV: 90 fL (ref 78.0–100.0)
Monocytes Relative: 10 % (ref 3–12)
Neutro Abs: 13.3 10*3/uL — ABNORMAL HIGH (ref 1.7–7.7)
Neutrophils Relative %: 77 % (ref 43–77)
Platelets: 197 10*3/uL (ref 150–400)
RDW: 12.8 % (ref 11.5–15.5)

## 2013-10-08 LAB — URINALYSIS, ROUTINE W REFLEX MICROSCOPIC
Bilirubin Urine: NEGATIVE
Hgb urine dipstick: NEGATIVE
Ketones, ur: NEGATIVE mg/dL
Nitrite: NEGATIVE
Protein, ur: NEGATIVE mg/dL
Specific Gravity, Urine: 1.013 (ref 1.005–1.030)
Urobilinogen, UA: 1 mg/dL (ref 0.0–1.0)

## 2013-10-08 LAB — POCT PREGNANCY, URINE: Preg Test, Ur: NEGATIVE

## 2013-10-08 MED ORDER — MORPHINE SULFATE 2 MG/ML IJ SOLN
2.0000 mg | Freq: Once | INTRAMUSCULAR | Status: AC
  Administered 2013-10-08: 2 mg via INTRAVENOUS
  Filled 2013-10-08: qty 1

## 2013-10-08 MED ORDER — NICOTINE 7 MG/24HR TD PT24
7.0000 mg | MEDICATED_PATCH | Freq: Every day | TRANSDERMAL | Status: DC
  Administered 2013-10-08 – 2013-10-11 (×4): 7 mg via TRANSDERMAL
  Filled 2013-10-08 (×4): qty 1

## 2013-10-08 MED ORDER — SODIUM CHLORIDE 0.9 % IV BOLUS (SEPSIS)
2000.0000 mL | Freq: Once | INTRAVENOUS | Status: AC
  Administered 2013-10-08: 2000 mL via INTRAVENOUS

## 2013-10-08 MED ORDER — MORPHINE SULFATE 4 MG/ML IJ SOLN
4.0000 mg | Freq: Once | INTRAMUSCULAR | Status: AC
  Administered 2013-10-08: 4 mg via INTRAVENOUS
  Filled 2013-10-08: qty 1

## 2013-10-08 MED ORDER — VANCOMYCIN HCL 10 G IV SOLR
1500.0000 mg | Freq: Two times a day (BID) | INTRAVENOUS | Status: DC
  Administered 2013-10-08 – 2013-10-11 (×6): 1500 mg via INTRAVENOUS
  Filled 2013-10-08 (×8): qty 1500

## 2013-10-08 MED ORDER — HYDROCODONE-ACETAMINOPHEN 5-325 MG PO TABS
1.0000 | ORAL_TABLET | ORAL | Status: DC | PRN
  Administered 2013-10-08 – 2013-10-09 (×4): 1 via ORAL
  Filled 2013-10-08 (×5): qty 1

## 2013-10-08 MED ORDER — MORPHINE SULFATE 4 MG/ML IJ SOLN: 6.0000 mg | Freq: Once | INTRAMUSCULAR | Status: DC

## 2013-10-08 MED ORDER — KETOROLAC TROMETHAMINE 30 MG/ML IJ SOLN
30.0000 mg | Freq: Once | INTRAMUSCULAR | Status: AC
  Filled 2013-10-08: qty 1

## 2013-10-08 MED ORDER — SODIUM CHLORIDE 0.9 % IV SOLN
INTRAVENOUS | Status: DC
  Administered 2013-10-08: 21:00:00 via INTRAVENOUS

## 2013-10-08 MED ORDER — CLINDAMYCIN PHOSPHATE 600 MG/50ML IV SOLN
600.0000 mg | Freq: Once | INTRAVENOUS | Status: AC
  Administered 2013-10-08: 600 mg via INTRAVENOUS
  Filled 2013-10-08 (×2): qty 50

## 2013-10-08 MED ORDER — ONDANSETRON HCL 4 MG/2ML IJ SOLN: 4.0000 mg | Freq: Four times a day (QID) | INTRAMUSCULAR | Status: DC | PRN

## 2013-10-08 MED ORDER — ONDANSETRON HCL 4 MG PO TABS: 4.0000 mg | ORAL_TABLET | Freq: Four times a day (QID) | ORAL | Status: DC | PRN

## 2013-10-08 MED ORDER — ONDANSETRON HCL 4 MG/2ML IJ SOLN
4.0000 mg | Freq: Once | INTRAMUSCULAR | Status: AC
  Administered 2013-10-08: 4 mg via INTRAVENOUS
  Filled 2013-10-08: qty 2

## 2013-10-08 MED ORDER — MORPHINE SULFATE 2 MG/ML IJ SOLN
1.0000 mg | INTRAMUSCULAR | Status: DC | PRN
  Administered 2013-10-08 – 2013-10-09 (×4): 1 mg via INTRAVENOUS
  Filled 2013-10-08 (×4): qty 1

## 2013-10-08 MED ORDER — ACETAMINOPHEN 650 MG RE SUPP: 650.0000 mg | Freq: Four times a day (QID) | RECTAL | Status: DC | PRN

## 2013-10-08 MED ORDER — DIPHENHYDRAMINE HCL 25 MG PO CAPS
25.0000 mg | ORAL_CAPSULE | Freq: Four times a day (QID) | ORAL | Status: DC | PRN
  Administered 2013-10-08 – 2013-10-09 (×2): 25 mg via ORAL
  Filled 2013-10-08 (×2): qty 1

## 2013-10-08 MED ORDER — ACETAMINOPHEN 325 MG PO TABS: 650.0000 mg | ORAL_TABLET | Freq: Four times a day (QID) | ORAL | Status: DC | PRN

## 2013-10-08 NOTE — H&P (Signed)
[facility] History and Physical  [name] HQI:[id number] DOB: 07-Oct-1981 DOA: 10/08/2013  Referring physician: ER physician. PCP: Steva Ready, MD   Chief Complaint: Right axillary pain and fever.  HPI: Stacie Carrillo is a 32 y.o. female with history of gestational diabetes and polycystic ovarian syndrome had originally come to the ER 2 days ago because of worsening pain in the right axillary area. Patient states her symptoms started 6 days ago with pain in the right axillary area which was gradually worsening. She initially tried some over-the-counter medications. Since it was not improving she came to the ER. In the ER 2 days ago patient had incision and drainage done which did not yield any pus. Patient was discharged home on doxycycline. Patient has been taking doxycycline last 2 days despite which her symptoms were getting worse. Patient also had fever and chills and had 3 episodes of diarrhea. At this time patient is admitted for IV antibiotics. Patient otherwise denies any chest pain or shortness of breath nausea vomiting abdominal pain.   Review of Systems: As presented in the history of presenting illness, rest negative.  Past Medical History  Diagnosis Date  . Depression   . Polycystic ovarian syndrome   . Obesity   . Gestational diabetes   . Reflux   . Anemia   . Anxiety   . Ulcer    Past Surgical History  Procedure Laterality Date  . Cesarean section      x3  . Tubal ligation     Social History:  reports that she has been smoking Cigarettes.  She has a 4.75 pack-year smoking history. She has never used smokeless tobacco. She reports that she drinks alcohol. She reports that she does not use illicit drugs. Where does patient live with his at home. Can patient participate in ADLs? Yes.  Allergies  Allergen Reactions  . Latex Other (See Comments)    Pt reports "eczema" with latex use    Family History:  Family History  Problem Relation Age  of Onset  . Throat cancer Maternal Aunt   . Cirrhosis Mother   . Diabetes Paternal Grandmother       Prior to Admission medications   Medication Sig Start Date End Date Taking? Authorizing Provider  doxycycline (VIBRAMYCIN) 100 MG capsule Take 1 capsule (100 mg total) by mouth 2 (two) times daily. 10/06/13  Yes Catherine E Schinlever, PA-C  HYDROcodone-acetaminophen (NORCO/VICODIN) 5-325 MG per tablet Take 1 tablet by mouth every 4 (four) hours as needed for moderate pain. 10/06/13  Yes Otilio Miu, PA-C    Physical Exam: Filed Vitals:   10/08/13 1831 10/08/13 1838 10/08/13 1931 10/08/13 2020  BP:   132/73 107/67  Pulse:   102 95  Temp: 102 F (38.9 C)  98.7 F (37.1 C) 98.1 F (36.7 C)  TempSrc: Rectal  Oral Oral  Resp:  20 20 18   Height:    5\' 5"  (1.651 m)  Weight:    89.812 kg (198 lb)  SpO2:   100% 97%     General:  Well-developed and nourished.  Eyes: Anicteric no pallor.  ENT: No discharge from the ears eyes nose mouth.  Neck: No mass felt.  Cardiovascular: S1-S2 heard.  Respiratory: No rhonchi or crepitations.  Abdomen: Soft nontender bowel sounds present.  Skin: Indurated area measuring around 5 x 2 cm in the right axilla area. No active discharge seen.  Musculoskeletal: Pain on moving right upper extremity.  Psychiatric: Appears normal.  Neurologic:  Alert and oriented to time place and person. Moves all extremities.  Labs on Admission:  Basic Metabolic Panel:  Recent Labs Lab 10/08/13 1856  NA 137  K 4.8  CL 103  GLUCOSE 91  BUN 6  CREATININE 0.70   Liver Function Tests: No results found for this basename: AST, ALT, ALKPHOS, BILITOT, PROT, ALBUMIN,  in the last 168 hours No results found for this basename: LIPASE, AMYLASE,  in the last 168 hours No results found for this basename: AMMONIA,  in the last 168 hours CBC:  Recent Labs Lab 10/08/13 1843 10/08/13 1856  WBC 17.3*  --   NEUTROABS 13.3*  --   HGB 14.0 15.0  HCT  38.5 44.0  MCV 90.0  --   PLT 197  --    Cardiac Enzymes: No results found for this basename: CKTOTAL, CKMB, CKMBINDEX, TROPONINI,  in the last 168 hours  BNP (last 3 results) No results found for this basename: PROBNP,  in the last 8760 hours CBG:  Recent Labs Lab 10/06/13 2204  GLUCAP 129*    Radiological Exams on Admission: No results found.   Assessment/Plan Principal Problem:   Cellulitis of axilla, right Active Problems:   TOBACCO DEPENDENCE   Esophageal reflux   Cellulitis   1. Cellulitis of the right axilla area - patient's symptoms are concerning for developing abscess/Hidradenitis. At this time blood cultures have been ordered and I have placed patient on IV vancomycin. I also place patient on morphine for pain with medications along with hydrocodone. If patient's pain does not improve may have to consider surgical evaluation. 2. Tobacco abuse - well-positioned counseling has been requested. 3. History of GERD - presently asymptomatic. 4. Leukocytosis - probably from #1 reason. Closely follow CBC and blood cultures. 5. History of polycystic ovarian syndrome and gestational diabetes - follow metabolic panel closely. Presently blood sugar is 91.    Code Status: Full code.  Family Communication: None.  Disposition Plan: Admit to inpatient.    Dorette Hartel N. [facility] Pager [phone].  If 7PM-7AM, please contact night-coverage [email or web address] Password [facility] 10/08/2013, 9:05 PM

## 2013-10-08 NOTE — ED Provider Notes (Signed)
Medical screening examination/treatment/procedure(s) were conducted as a shared visit with non-physician practitioner(s) and myself.  I personally evaluated the patient during the encounter.  EKG Interpretation    Date/Time:    Ventricular Rate:    PR Interval:    QRS Duration:   QT Interval:    QTC Calculation:   R Axis:     Text Interpretation:               Doug Sou, MD 10/08/13 2358

## 2013-10-08 NOTE — ED Provider Notes (Signed)
CSN: [id number]     Arrival date & time 10/08/13  1723 History   First MD Initiated Contact with Patient 10/08/13 1724     Chief Complaint  Patient presents with  . Abscess  . Fever   (Consider location/radiation/quality/duration/timing/severity/associated sxs/prior Treatment) HPI Comments: Patient is a 32 year old female who presents today for worsening abscess. The abscess has been gradually worsening for the past 5 days. She was seen on 12/19 and had the abscess in her right axilla drained. There was no drainage and the patient was sent home with doxycycline. She has been taking both the doxycyline and norco as prescribed with no relief of her symptoms. She reports that the norco helps her fall asleep, but the pain is just as severe when she wakes up. Her last dose of Norco was at noon today. Her fevers have been as high as 101F measured orally. She has also developed some mild diarrhea. No shortness of breath or chest pain.   Patient is a 32 y.o. female presenting with abscess and fever. The history is provided by the patient. No language interpreter was used.  Abscess Associated symptoms: fever and nausea   Associated symptoms: no vomiting   Fever Associated symptoms: chills, diarrhea and nausea   Associated symptoms: no chest pain and no vomiting     Past Medical History  Diagnosis Date  . Depression   . Polycystic ovarian syndrome   . Obesity   . Gestational diabetes   . Reflux   . Anemia   . Anxiety   . Ulcer    Past Surgical History  Procedure Laterality Date  . Cesarean section      x3  . Tubal ligation     Family History  Problem Relation Age of Onset  . Throat cancer Maternal Aunt   . Cirrhosis Mother   . Diabetes Paternal Grandmother    History  Substance Use Topics  . Smoking status: Current Every Day Smoker -- 0.25 packs/day for 19 years    Types: Cigarettes  . Smokeless tobacco: Never Used  . Alcohol Use: Yes     Comment: occasionally   OB History    Grav Para Term Preterm Abortions TAB SAB Ect Mult Living   4 4 4  0 0 0 0 0 0 4     Review of Systems  Constitutional: Positive for fever, chills and diaphoresis.  Respiratory: Negative for shortness of breath.   Cardiovascular: Negative for chest pain.  Gastrointestinal: Positive for nausea and diarrhea. Negative for vomiting and abdominal pain.  Skin: Positive for wound.  All other systems reviewed and are negative.    Allergies  Latex  Home Medications   Current Outpatient Rx  Name  Route  Sig  Dispense  Refill  . doxycycline (VIBRAMYCIN) 100 MG capsule   Oral   Take 1 capsule (100 mg total) by mouth 2 (two) times daily.   20 capsule   0   . HYDROcodone-acetaminophen (NORCO/VICODIN) 5-325 MG per tablet   Oral   Take 1 tablet by mouth every 4 (four) hours as needed for moderate pain.   20 tablet   0    BP 114/80  Pulse 122  Temp(Src) 99.9 F (37.7 C)  SpO2 100%  LMP 09/18/2013 Physical Exam  Nursing note and vitals reviewed. Constitutional: She is oriented to person, place, and time. She appears well-developed and well-nourished. No distress.  Skin is warm to palpation   HENT:  Head: Normocephalic and atraumatic.  Right  Ear: External ear normal.  Left Ear: External ear normal.  Nose: Nose normal.  Mouth/Throat: Oropharynx is clear and moist.  Eyes: Conjunctivae are normal.  Neck: Normal range of motion.  Cardiovascular: Normal rate, regular rhythm and normal heart sounds.   Pulmonary/Chest: Effort normal and breath sounds normal. No stridor. No respiratory distress. She has no wheezes. She has no rales.  Abdominal: Soft. She exhibits no distension.  Musculoskeletal: Normal range of motion.  Neurological: She is alert and oriented to person, place, and time. She has normal strength.  Skin: Skin is warm. She is diaphoretic. No erythema.  7 cm area of induration and erythema in right axilla. Pt guards area. Exquisitely tender to palpation.   Psychiatric:  She has a normal mood and affect. Her behavior is normal.    ED Course  Procedures (including critical care time) Labs Review Labs Reviewed  CBC WITH DIFFERENTIAL - Abnormal; Notable for the following:    WBC 17.3 (*)    MCHC 36.4 (*)    Neutro Abs 13.3 (*)    Monocytes Absolute 1.7 (*)    All other components within normal limits  URINALYSIS, ROUTINE W REFLEX MICROSCOPIC - Abnormal; Notable for the following:    APPearance CLOUDY (*)    All other components within normal limits  CULTURE, BLOOD (ROUTINE X 2)  CULTURE, BLOOD (ROUTINE X 2)  CG4 I-STAT (LACTIC ACID)  POCT I-STAT, CHEM 8  POCT PREGNANCY, URINE   Imaging Review No results found.  EKG Interpretation    Date/Time:    Ventricular Rate:    PR Interval:    QRS Duration:   QT Interval:    QTC Calculation:   R Axis:     Text Interpretation:              MDM  No diagnosis found.  Pt is febrile with worsening hidradenitis. Pt is now febrile with elevated WBC at 17.3. Lactic acid is normal. Blood cultures drawn. Pt started on IV clindamycin. Pt will be admitted to medicine for further management of this infection. Admission is appreciated. Vital signs are stable at this time. Dr. Ethelda Chick evaluated this patient and agrees with plan. Patient / Family / Caregiver informed of clinical course, understand medical decision-making process, and agree with plan.  Medications  morphine 4 MG/ML injection 4 mg (4 mg Intravenous Given 10/08/13 1843)  clindamycin (CLEOCIN) IVPB 600 mg (0 mg Intravenous Stopped 10/08/13 1927)  ondansetron (ZOFRAN) injection 4 mg (4 mg Intravenous Given 10/08/13 1844)  sodium chloride 0.9 % bolus 2,000 mL (0 mLs Intravenous Stopped 10/08/13 1957)  morphine 2 MG/ML injection 2 mg (2 mg Intravenous Given 10/08/13 1905)  ketorolac (TORADOL) 30 MG/ML injection 30 mg (0 mg Intravenous Duplicate 10/08/13 1930)        Mora Bellman, PA-C 10/08/13 2032

## 2013-10-08 NOTE — ED Notes (Addendum)
Pt presents with an abscess on right armpit, states he was seen on Friday night for the same. States no serous drainage on Friday. Reports increased pain, fever and chills, nausea and 2 episodes of diarrhea this a.m. Numbness and tingling Assessed scant drainage, Vitals include elevated temp and HR. Assessed radial pulse and cap refill on affected side.

## 2013-10-08 NOTE — ED Provider Notes (Signed)
Place a painful right axilla onset 4 days ago. Progressively worsening systems accompanied by nausea and  temperature 101 orally she's been treated with doxycycline and Vicodin symptoms continue to worsen. On exam she is alert appears uncomfortable Glasgow Coma Score 15 nontoxic appearing at upper extremity tender axilla with surrounding erythema  no definite fluctuance . Suspect hydroadenitis suppurativa  Doug Sou, MD 10/08/13 [phone]

## 2013-10-08 NOTE — Progress Notes (Signed)
ANTIBIOTIC CONSULT NOTE - INITIAL  Pharmacy Consult for Vancomycin Indication: Right axilla cellulitis/abscess  Allergies  Allergen Reactions  . Latex Other (See Comments)    Pt reports "eczema" with latex use    Patient Measurements: Height: 5\' 5"  (165.1 cm) Weight: 198 lb (89.812 kg) IBW/kg (Calculated) : 57   Vital Signs: Temp: 98.1 F (36.7 C) (12/21 2020) Temp src: Oral (12/21 2020) BP: 107/67 mmHg (12/21 2020) Pulse Rate: 95 (12/21 2020) Intake/Output from previous day:   Intake/Output from this shift:    Labs:  Recent Labs  10/08/13 1843 10/08/13 1856  WBC 17.3*  --   HGB 14.0 15.0  PLT 197  --   CREATININE  --  0.70   Estimated Creatinine Clearance: 110.7 ml/min (by C-G formula based on Cr of 0.7). No results found for this basename: VANCOTROUGH, VANCOPEAK, VANCORANDOM, GENTTROUGH, GENTPEAK, GENTRANDOM, TOBRATROUGH, TOBRAPEAK, TOBRARND, AMIKACINPEAK, AMIKACINTROU, AMIKACIN,  in the last 72 hours   Microbiology: No results found for this or any previous visit (from the past 720 hour(s)).  Medical History: Past Medical History  Diagnosis Date  . Depression   . Polycystic ovarian syndrome   . Obesity   . Gestational diabetes   . Reflux   . Anemia   . Anxiety   . Ulcer     Medications:  Scheduled:  . vancomycin  1,500 mg Intravenous Q12H   Infusions:  . sodium chloride 100 mL/hr at 10/08/13 2116   Assessment: 32 yo presents today with worsening right axilla cellulitis/abcess.  Seen 12/19 abscess drained and sent home on doxy.  Vancomycin per Rx ordered.  Goal of Therapy:  Vancomycin trough level 15-20 mcg/ml  Plan:   Vancomycin 1500mg  IV q12h.  CrCl~129 (N)  F/U SCr/levels/cultures as needed.  Susanne Greenhouse R 10/08/2013,9:21 PM

## 2013-10-08 NOTE — ED Notes (Signed)
Pt reports toradol worked better than morphine.

## 2013-10-09 ENCOUNTER — Encounter (HOSPITAL_COMMUNITY)
Admission: EM | Disposition: A | Discharge status: Home Health | Payer: Self-pay | Source: Home / Self Care | Attending: Internal Medicine

## 2013-10-09 ENCOUNTER — Inpatient Hospital Stay (HOSPITAL_COMMUNITY): Payer: Medicaid Other | Admitting: Anesthesiology

## 2013-10-09 ENCOUNTER — Encounter (HOSPITAL_COMMUNITY): Payer: Medicaid Other | Admitting: Anesthesiology

## 2013-10-09 ENCOUNTER — Encounter (HOSPITAL_COMMUNITY): Payer: Self-pay | Admitting: General Surgery

## 2013-10-09 DIAGNOSIS — IMO0002 Reserved for concepts with insufficient information to code with codable children: Secondary | ICD-10-CM

## 2013-10-09 DIAGNOSIS — L732 Hidradenitis suppurativa: Secondary | ICD-10-CM

## 2013-10-09 DIAGNOSIS — D649 Anemia, unspecified: Secondary | ICD-10-CM

## 2013-10-09 HISTORY — PX: IRRIGATION AND DEBRIDEMENT ABSCESS: SHX5252

## 2013-10-09 LAB — BASIC METABOLIC PANEL
CO2: 22 mEq/L (ref 19–32)
Calcium: 7.9 mg/dL — ABNORMAL LOW (ref 8.4–10.5)
Creatinine, Ser: 0.73 mg/dL (ref 0.50–1.10)
GFR calc Af Amer: >90 mL/min (ref >90)
Potassium: 3.6 mEq/L (ref 3.5–5.1)
Sodium: 136 mEq/L (ref 135–145)

## 2013-10-09 LAB — SURGICAL PCR SCREEN
MRSA, PCR: NEGATIVE
Staphylococcus aureus: NEGATIVE

## 2013-10-09 LAB — CBC
HCT: 33.2 % — ABNORMAL LOW (ref 36.0–46.0)
Hemoglobin: 11.8 g/dL — ABNORMAL LOW (ref 12.0–15.0)
Platelets: 168 10*3/uL (ref 150–400)
RBC: 3.67 MIL/uL — ABNORMAL LOW (ref 3.87–5.11)
RDW: 12.7 % (ref 11.5–15.5)
WBC: 14.3 10*3/uL — ABNORMAL HIGH (ref 4.0–10.5)

## 2013-10-09 LAB — HEMOGLOBIN A1C
Hgb A1c MFr Bld: 4.7 % (ref <5.7)
Mean Plasma Glucose: 88 mg/dL (ref <117)

## 2013-10-09 SURGERY — IRRIGATION AND DEBRIDEMENT ABSCESS
Anesthesia: General | Site: Axilla | Laterality: Right

## 2013-10-09 MED ORDER — KETOROLAC TROMETHAMINE 15 MG/ML IJ SOLN
15.0000 mg | Freq: Four times a day (QID) | INTRAMUSCULAR | Status: AC | PRN
  Administered 2013-10-09 – 2013-10-10 (×5): 15 mg via INTRAVENOUS
  Filled 2013-10-09 (×5): qty 1

## 2013-10-09 MED ORDER — 0.9 % SODIUM CHLORIDE (POUR BTL) OPTIME
TOPICAL | Status: DC | PRN
  Administered 2013-10-09: 1000 mL

## 2013-10-09 MED ORDER — ONDANSETRON HCL 4 MG/2ML IJ SOLN
INTRAMUSCULAR | Status: AC
  Filled 2013-10-09: qty 2

## 2013-10-09 MED ORDER — MIDAZOLAM HCL 2 MG/2ML IJ SOLN
INTRAMUSCULAR | Status: AC
  Filled 2013-10-09: qty 2

## 2013-10-09 MED ORDER — DEXAMETHASONE SODIUM PHOSPHATE 10 MG/ML IJ SOLN
INTRAMUSCULAR | Status: DC | PRN
  Administered 2013-10-09: 10 mg via INTRAVENOUS

## 2013-10-09 MED ORDER — PROMETHAZINE HCL 25 MG/ML IJ SOLN: 6.2500 mg | INTRAMUSCULAR | Status: DC | PRN

## 2013-10-09 MED ORDER — PROPOFOL 10 MG/ML IV BOLUS
INTRAVENOUS | Status: DC | PRN
  Administered 2013-10-09: 200 mg via INTRAVENOUS
  Administered 2013-10-09: 50 mg via INTRAVENOUS

## 2013-10-09 MED ORDER — FENTANYL CITRATE 0.05 MG/ML IJ SOLN
INTRAMUSCULAR | Status: AC
  Filled 2013-10-09: qty 2

## 2013-10-09 MED ORDER — LIDOCAINE HCL (CARDIAC) 20 MG/ML IV SOLN
INTRAVENOUS | Status: DC | PRN
  Administered 2013-10-09: 75 mg via INTRAVENOUS

## 2013-10-09 MED ORDER — SUCCINYLCHOLINE CHLORIDE 20 MG/ML IJ SOLN
INTRAMUSCULAR | Status: DC | PRN
  Administered 2013-10-09: 100 mg via INTRAVENOUS

## 2013-10-09 MED ORDER — PROPOFOL 10 MG/ML IV BOLUS
INTRAVENOUS | Status: AC
  Filled 2013-10-09: qty 20

## 2013-10-09 MED ORDER — FENTANYL CITRATE 0.05 MG/ML IJ SOLN
25.0000 ug | INTRAMUSCULAR | Status: DC | PRN
  Administered 2013-10-09: 25 ug via INTRAVENOUS

## 2013-10-09 MED ORDER — MORPHINE SULFATE 2 MG/ML IJ SOLN
1.0000 mg | INTRAMUSCULAR | Status: DC | PRN
  Administered 2013-10-09 – 2013-10-11 (×4): 2 mg via INTRAVENOUS
  Filled 2013-10-09 (×4): qty 1

## 2013-10-09 MED ORDER — SODIUM CHLORIDE 0.9 % IV SOLN
INTRAVENOUS | Status: DC
  Administered 2013-10-09: 09:00:00 via INTRAVENOUS
  Administered 2013-10-09 – 2013-10-11 (×2): 100 mL/h via INTRAVENOUS

## 2013-10-09 MED ORDER — MIDAZOLAM HCL 5 MG/5ML IJ SOLN
INTRAMUSCULAR | Status: DC | PRN
  Administered 2013-10-09 (×2): 1 mg via INTRAVENOUS

## 2013-10-09 MED ORDER — LIDOCAINE HCL (CARDIAC) 20 MG/ML IV SOLN
INTRAVENOUS | Status: AC
  Filled 2013-10-09: qty 5

## 2013-10-09 MED ORDER — FENTANYL CITRATE 0.05 MG/ML IJ SOLN: 25.0000 ug | INTRAMUSCULAR | Status: DC | PRN

## 2013-10-09 MED ORDER — FENTANYL CITRATE 0.05 MG/ML IJ SOLN
INTRAMUSCULAR | Status: AC
  Filled 2013-10-09: qty 5

## 2013-10-09 MED ORDER — ONDANSETRON HCL 4 MG/2ML IJ SOLN
INTRAMUSCULAR | Status: DC | PRN
  Administered 2013-10-09: 4 mg via INTRAVENOUS

## 2013-10-09 MED ORDER — LACTATED RINGERS IV SOLN
INTRAVENOUS | Status: DC | PRN
  Administered 2013-10-09 (×2): via INTRAVENOUS

## 2013-10-09 MED ORDER — ENOXAPARIN SODIUM 40 MG/0.4ML ~~LOC~~ SOLN
40.0000 mg | SUBCUTANEOUS | Status: DC
  Filled 2013-10-09: qty 0.4

## 2013-10-09 MED ORDER — DEXAMETHASONE SODIUM PHOSPHATE 10 MG/ML IJ SOLN
INTRAMUSCULAR | Status: AC
  Filled 2013-10-09: qty 1

## 2013-10-09 MED ORDER — FENTANYL CITRATE 0.05 MG/ML IJ SOLN
INTRAMUSCULAR | Status: DC | PRN
  Administered 2013-10-09: 100 ug via INTRAVENOUS
  Administered 2013-10-09 (×3): 50 ug via INTRAVENOUS

## 2013-10-09 SURGICAL SUPPLY — 31 items
BANDAGE GAUZE ELAST BULKY 4 IN (GAUZE/BANDAGES/DRESSINGS) ×2 IMPLANT
BENZOIN TINCTURE PRP APPL 2/3 (GAUZE/BANDAGES/DRESSINGS) IMPLANT
BLADE HEX COATED 2.75 (ELECTRODE) ×2 IMPLANT
BLADE SURG 15 STRL LF DISP TIS (BLADE) ×2 IMPLANT
BLADE SURG 15 STRL SS (BLADE) ×2
BLADE SURG SZ10 CARB STEEL (BLADE) ×2 IMPLANT
CANISTER SUCTION 2500CC (MISCELLANEOUS) ×2 IMPLANT
DECANTER SPIKE VIAL GLASS SM (MISCELLANEOUS) IMPLANT
DRAIN PENROSE 18X1/2 LTX STRL (DRAIN) IMPLANT
DRAPE LAPAROTOMY TRNSV 102X78 (DRAPE) ×2 IMPLANT
ELECT REM PT RETURN 9FT ADLT (ELECTROSURGICAL) ×2
ELECTRODE REM PT RTRN 9FT ADLT (ELECTROSURGICAL) ×1 IMPLANT
GLOVE BIOGEL PI IND STRL 7.0 (GLOVE) IMPLANT
GLOVE BIOGEL PI INDICATOR 7.0 (GLOVE)
GLOVE SURG SIGNA 7.5 PF LTX (GLOVE) IMPLANT
GOWN PREVENTION PLUS LG XLONG (DISPOSABLE) ×2 IMPLANT
GOWN STRL REIN XL XLG (GOWN DISPOSABLE) ×4 IMPLANT
KIT BASIN OR (CUSTOM PROCEDURE TRAY) ×2 IMPLANT
NEEDLE HYPO 25X1 1.5 SAFETY (NEEDLE) IMPLANT
NS IRRIG 1000ML POUR BTL (IV SOLUTION) ×2 IMPLANT
PACK BASIC VI WITH GOWN DISP (CUSTOM PROCEDURE TRAY) ×2 IMPLANT
PENCIL BUTTON HOLSTER BLD 10FT (ELECTRODE) ×2 IMPLANT
SPONGE GAUZE 4X4 12PLY (GAUZE/BANDAGES/DRESSINGS) ×2 IMPLANT
SPONGE LAP 4X18 X RAY DECT (DISPOSABLE) ×2 IMPLANT
STRIP CLOSURE SKIN 1/2X4 (GAUZE/BANDAGES/DRESSINGS) IMPLANT
SYR BULB IRRIGATION 50ML (SYRINGE) IMPLANT
SYR CONTROL 10ML LL (SYRINGE) IMPLANT
TAPE CLOTH SOFT 2X10 (GAUZE/BANDAGES/DRESSINGS) ×2 IMPLANT
TOWEL OR 17X26 10 PK STRL BLUE (TOWEL DISPOSABLE) ×2 IMPLANT
TUBE ANAEROBIC SPECIMEN COL (MISCELLANEOUS) ×2 IMPLANT
YANKAUER SUCT BULB TIP 10FT TU (MISCELLANEOUS) ×2 IMPLANT

## 2013-10-09 NOTE — Progress Notes (Signed)
32 yo AA F with right axillary abscess.  She works in Human resources officer.  Has 4 children at home.   The infection/abscess has progressed despite antibiotics.  Plan I&D of right axilla this PM.  I discussed indications and complications with patient.  She's also on isolation of "enteric precautions" - diarrhea on antibiotics.  She's having no abdominal symptoms and has not had a BM since admission.  Ovidio Kin, MD, [facility] Surgery Pager: [phone] Office phone:  [phone]

## 2013-10-09 NOTE — Progress Notes (Signed)
Utilization review completed.  

## 2013-10-09 NOTE — Progress Notes (Signed)
[facility] PROGRESS NOTE  Stacie Carrillo JWJ:[id number] DOB: 02/20/1981 DOA: 10/08/2013 PCP: Steva Ready, MD  Assessment/Plan: 1. R axillary cellulitis/developing abscess -continue IV Vanc, FU blood Cx -Warm compresses -Surgical eval -IVF, pain control  2. Tobacco abuse  -counseled  3. Leukocytosis  -improving -blood cultures  4. History of polycystic ovarian syndrome and gestational diabetes  -check hbaic  DVT proph: add lovenox  Code Status: Full Code Family Communication: none at bedside Disposition Plan: home when improved   Consultants:  CCS pending  Antibiotics:  Vancomcyin  HPI/Subjective: Severe pain in RUE  Objective: Filed Vitals:   10/09/13 0600  BP: 119/78  Pulse: 92  Temp: 98.3 F (36.8 C)  Resp: 18    Intake/Output Summary (Last 24 hours) at 10/09/13 0918 Last data filed at 10/09/13 0830  Gross per 24 hour  Intake 873.33 ml  Output    150 ml  Net 723.33 ml   Filed Weights   10/08/13 2020  Weight: 89.812 kg (198 lb)    Exam:   General:  AAOx3  Cardiovascular:S1S2/RRR  Respiratory: CTAB  Abdomen: soft, Nt, BS present  Musculoskeletal: no edema c/c, pain with any movement of RUE  R axilla: large indurated area, very tender, non fluctuant  Data Reviewed: Basic Metabolic Panel:  Recent Labs Lab 10/08/13 1856 10/09/13 0405  NA 137 136  K 4.8 3.6  CL 103 105  CO2  --  22  GLUCOSE 91 108*  BUN 6 8  CREATININE 0.70 0.73  CALCIUM  --  7.9*   Liver Function Tests: No results found for this basename: AST, ALT, ALKPHOS, BILITOT, PROT, ALBUMIN,  in the last 168 hours No results found for this basename: LIPASE, AMYLASE,  in the last 168 hours No results found for this basename: AMMONIA,  in the last 168 hours CBC:  Recent Labs Lab 10/08/13 1843 10/08/13 1856 10/09/13 0405  WBC 17.3*  --  14.3*  NEUTROABS 13.3*  --   --   HGB 14.0 15.0 11.8*  HCT 38.5 44.0 33.2*  MCV 90.0  --  90.5  PLT 197  --   168   Cardiac Enzymes: No results found for this basename: CKTOTAL, CKMB, CKMBINDEX, TROPONINI,  in the last 168 hours BNP (last 3 results) No results found for this basename: PROBNP,  in the last 8760 hours CBG:  Recent Labs Lab 10/06/13 2204  GLUCAP 129*    No results found for this or any previous visit (from the past 240 hour(s)).   Studies: No results found.  Scheduled Meds: . nicotine  7 mg Transdermal Daily  . vancomycin  1,500 mg Intravenous Q12H   Continuous Infusions: . sodium chloride 100 mL/hr at 10/09/13 6213    Principal Problem:   Cellulitis of axilla, right Active Problems:   TOBACCO DEPENDENCE   Esophageal reflux   Cellulitis    Time spent:    [facility]  [facility] Pager [phone]. If 7PM-7AM, please contact night-coverage at [email or web address], password [facility] 10/09/2013, 9:18 AM  LOS: 1 day

## 2013-10-09 NOTE — Transfer of Care (Signed)
Immediate Anesthesia Transfer of Care Note  Patient: Stacie Carrillo  Procedure(s) Performed: Procedure(s): IRRIGATION AND DEBRIDEMENT RIGHT AXILLARY ABSCESS (Right)  Patient Location: PACU  Anesthesia Type:General  Level of Consciousness: awake, oriented, patient cooperative, lethargic and responds to stimulation  Airway & Oxygen Therapy: Patient Spontanous Breathing and Patient connected to face mask oxygen  Post-op Assessment: Report given to PACU RN, Post -op Vital signs reviewed and stable and Patient moving all extremities  Post vital signs: Reviewed and stable  Complications: No apparent anesthesia complications

## 2013-10-09 NOTE — Anesthesia Preprocedure Evaluation (Addendum)
Anesthesia Evaluation  Patient identified by MRN, date of birth, ID band Patient awake    Reviewed: Allergy & Precautions, H&P , NPO status , Patient's Chart, lab work & pertinent test results  Airway Mallampati: II TM Distance: >3 FB Neck ROM: Full    Dental no notable dental hx.    Pulmonary Current Smoker,  breath sounds clear to auscultation  Pulmonary exam normal       Cardiovascular negative cardio ROS  Rhythm:Regular Rate:Normal     Neuro/Psych PSYCHIATRIC DISORDERS Anxiety Depression negative neurological ROS     GI/Hepatic Neg liver ROS, GERD-  Controlled,  Endo/Other  diabetes, Gestational  Renal/GU negative Renal ROS  negative genitourinary   Musculoskeletal negative musculoskeletal ROS (+)   Abdominal (+) + obese,   Peds negative pediatric ROS (+)  Hematology  (+) anemia ,   Anesthesia Other Findings   Reproductive/Obstetrics negative OB ROS                          Anesthesia Physical Anesthesia Plan  ASA: II  Anesthesia Plan: General   Post-op Pain Management:    Induction: Intravenous  Airway Management Planned: Oral ETT  Additional Equipment:   Intra-op Plan:   Post-operative Plan: Extubation in OR  Informed Consent: I have reviewed the patients History and Physical, chart, labs and discussed the procedure including the risks, benefits and alternatives for the proposed anesthesia with the patient or authorized representative who has indicated his/her understanding and acceptance.   Dental advisory given  Plan Discussed with: CRNA  Anesthesia Plan Comments: (Half a piece of bacon at 0930. Currently nauseated. Plan OETT.)       Anesthesia Quick Evaluation

## 2013-10-09 NOTE — Anesthesia Postprocedure Evaluation (Signed)
  Anesthesia Post-op Note  Patient: Stacie Carrillo  Procedure(s) Performed: Procedure(s) (LRB): IRRIGATION AND DEBRIDEMENT RIGHT AXILLARY ABSCESS (Right)  Patient Location: PACU  Anesthesia Type: General  Level of Consciousness: awake and alert   Airway and Oxygen Therapy: Patient Spontanous Breathing  Post-op Pain: mild  Post-op Assessment: Post-op Vital signs reviewed, Patient's Cardiovascular Status Stable, Respiratory Function Stable, Patent Airway and No signs of Nausea or vomiting  Last Vitals:  Filed Vitals:   10/09/13 2050  BP: 114/72  Pulse: 82  Temp: 37.2 C  Resp: 16    Post-op Vital Signs: stable   Complications: No apparent anesthesia complications

## 2013-10-09 NOTE — Preoperative (Signed)
Beta Blockers   Reason not to administer Beta Blockers:Not Applicable 

## 2013-10-09 NOTE — Op Note (Signed)
10/08/2013 - 10/09/2013  5:32 PM  PATIENT:  Stacie Carrillo, 32 y.o., female, MRN: [id number]  PREOP DIAGNOSIS:  right axillary abcess  POSTOP DIAGNOSIS:   Right axillary abscess - 3 x 8 cm  PROCEDURE:   Procedure(s): IRRIGATION AND DEBRIDEMENT RIGHT AXILLARY ABSCESS  SURGEON:   Ovidio Kin, M.D.  ANESTHESIA:   general  Anesthesiologist: Azell Der, MD CRNA: Edison Pace, CRNA  General  EBL:  100  ml  DRAINS: The wound is packed with 4 inch curlex  LOCAL MEDICATIONS USED:   None  SPECIMEN:   Cultures obtained for aerobe and anaerobe  COUNTS CORRECT:  YES  INDICATIONS FOR PROCEDURE:  [name] is a 32 y.o. (DOB: 08/22/1981) AA  female whose primary care physician is Steva Ready, MD and comes for I&D of right axillary abscess   The indications and risks of the surgery were explained to the patient.  The risks include, but are not limited to, infection, bleeding, and nerve injury.  OPERATIVE NOTE:  The patient was taken to room #6 at the [facility]. She was under general anethesia supervised by Dr. Leonard Schwartz. [place].   A time out was held and the surgical check list run.   Her right axilla was prepped with betadine.  I made a linear abscess into a foul smelling abscess.  I obtained aerobe and anaerobe cultures.  Hemostasis was controlled with Bovie electrocautery.  The wound was irrigated with 1,000 saline.  Then packed with 4 inch Curlex soaked in betadine.   The wound was sterilely dressed.  The sponge count was correct.  The patient was transferred to the RR in good condition.  Ovidio Kin, MD, [facility] Surgery Pager: [phone] Office phone:  [phone]

## 2013-10-09 NOTE — Anesthesia Procedure Notes (Signed)
Procedure Name: Intubation Date/Time: 10/09/2013 4:59 PM Performed by: Edison Pace Pre-anesthesia Checklist: Emergency Drugs available, Timeout performed, Suction available, Patient being monitored and Patient identified Patient Re-evaluated:Patient Re-evaluated prior to inductionOxygen Delivery Method: Circle system utilized Preoxygenation: Pre-oxygenation with 100% oxygen Intubation Type: IV induction, Cricoid Pressure applied and Rapid sequence Laryngoscope Size: Mac and 4 Grade View: Grade I Tube type: Oral Tube size: 7.5 mm Number of attempts: 1 Airway Equipment and Method: Stylet Placement Confirmation: ETT inserted through vocal cords under direct vision,  positive ETCO2 and breath sounds checked- equal and bilateral Secured at: 21 cm Tube secured with: Tape Dental Injury: Teeth and Oropharynx as per pre-operative assessment

## 2013-10-09 NOTE — Consult Note (Signed)
Stacie Carrillo 05-04-1981  [id number].   Requesting MD: Dr. Zannie Cove Chief Complaint/Reason for Consult: right axillary abscess HPI: This is a 32 yo black female who has a h/o axillary abscesses that have required I&D in the past.  She hasn't had one in about a year.  This past Tuesday she developed pain under her right axilla.  It continued to progress despite treatment at home including warm compresses.  She came to the [facility] on Friday where they attempted an I&D.  This yielded nothing.  She was placed on doxycycline and told to continued warm compresses etc.  Her pain just continued to worsen and she started running fevers up to 102.  She came back to the ED on Sunday where she was admitted for IV abx therapy.  We have been asked to see her today for possible further I&D.  ROS: Please see HPI, otherwise all other systems have been reviewed and are currently negative  Family History  Problem Relation Age of Onset  . Throat cancer Maternal Aunt   . Cirrhosis Mother   . Diabetes Paternal Grandmother     Past Medical History  Diagnosis Date  . Depression   . Polycystic ovarian syndrome   . Obesity   . Gestational diabetes   . Reflux   . Anemia   . Anxiety   . Ulcer     Past Surgical History  Procedure Laterality Date  . Tubal ligation    . Cesarean section      x4    Social History:  reports that she has been smoking Cigarettes.  She has a 4.75 pack-year smoking history. She has never used smokeless tobacco. She reports that she does not drink alcohol or use illicit drugs.  Allergies:  Allergies  Allergen Reactions  . Latex Other (See Comments)    Pt reports "eczema" with latex use    Medications Prior to Admission  Medication Sig Dispense Refill  . doxycycline (VIBRAMYCIN) 100 MG capsule Take 1 capsule (100 mg total) by mouth 2 (two) times daily.  20 capsule  0  . HYDROcodone-acetaminophen (NORCO/VICODIN) 5-325 MG per tablet Take 1 tablet by mouth every 4 (four)  hours as needed for moderate pain.  20 tablet  0    Blood pressure 119/78, pulse 92, temperature 98.3 F (36.8 C), temperature source Oral, resp. rate 18, height 5\' 5"  (1.651 m), weight 198 lb (89.812 kg), last menstrual period 09/18/2013, SpO2 97.00%. Physical Exam: General: pleasant, WD, WN black female who is laying in bed in NAD HEENT: head is normocephalic, atraumatic.  Sclera are noninjected.  PERRL.  Ears and nose without any masses or lesions.  Mouth is pink and moist Heart: regular, rate, and rhythm.  Normal s1,s2. No obvious murmurs, gallops, or rubs noted.  Palpable radial and pedal pulses bilaterally Lungs: CTAB, no wheezes, rhonchi, or rales noted.  Respiratory effort nonlabored Abd: soft, NT, ND, +BS, no masses, hernias, or organomegaly Skin: warm and dry with no masses, lesions, or rashes, she does have erythema and tenderness to her right axilla.  She has a large area of fluctuance with abscess noted. No active draining currently Psych: A&Ox3 with an appropriate affect.    Results for orders placed during the hospital encounter of 10/08/13 (from the past 48 hour(s))  CBC WITH DIFFERENTIAL     Status: Abnormal   Collection Time    10/08/13  6:43 PM      Result Value Range   WBC 17.3 (*) 4.0 -  10.5 K/uL   RBC 4.28  3.87 - 5.11 MIL/uL   Hemoglobin 14.0  12.0 - 15.0 g/dL   HCT 16.1  09.6 - 04.5 %   MCV 90.0  78.0 - 100.0 fL   MCH 32.7  26.0 - 34.0 pg   MCHC 36.4 (*) 30.0 - 36.0 g/dL   RDW 40.9  81.1 - 91.4 %   Platelets 197  150 - 400 K/uL   Neutrophils Relative % 77  43 - 77 %   Neutro Abs 13.3 (*) 1.7 - 7.7 K/uL   Lymphocytes Relative 12  12 - 46 %   Lymphs Abs 2.1  0.7 - 4.0 K/uL   Monocytes Relative 10  3 - 12 %   Monocytes Absolute 1.7 (*) 0.1 - 1.0 K/uL   Eosinophils Relative 1  0 - 5 %   Eosinophils Absolute 0.1  0.0 - 0.7 K/uL   Basophils Relative 0  0 - 1 %   Basophils Absolute 0.0  0.0 - 0.1 K/uL  CG4 I-STAT (LACTIC ACID)     Status: None   Collection  Time    10/08/13  6:56 PM      Result Value Range   Lactic Acid, Venous 0.94  0.5 - 2.2 mmol/L  POCT I-STAT, CHEM 8     Status: None   Collection Time    10/08/13  6:56 PM      Result Value Range   Sodium 137  135 - 145 mEq/L   Potassium 4.8  3.5 - 5.1 mEq/L   Chloride 103  96 - 112 mEq/L   BUN 6  6 - 23 mg/dL   Creatinine, Ser 7.82  0.50 - 1.10 mg/dL   Glucose, Bld 91  70 - 99 mg/dL   Calcium, Ion 9.56  2.13 - 1.23 mmol/L   TCO2 24  0 - 100 mmol/L   Hemoglobin 15.0  12.0 - 15.0 g/dL   HCT 08.6  57.8 - 46.9 %  URINALYSIS, ROUTINE W REFLEX MICROSCOPIC     Status: Abnormal   Collection Time    10/08/13  7:08 PM      Result Value Range   Color, Urine YELLOW  YELLOW   APPearance CLOUDY (*) CLEAR   Specific Gravity, Urine 1.013  1.005 - 1.030   pH 7.0  5.0 - 8.0   Glucose, UA NEGATIVE  NEGATIVE mg/dL   Hgb urine dipstick NEGATIVE  NEGATIVE   Bilirubin Urine NEGATIVE  NEGATIVE   Ketones, ur NEGATIVE  NEGATIVE mg/dL   Protein, ur NEGATIVE  NEGATIVE mg/dL   Urobilinogen, UA 1.0  0.0 - 1.0 mg/dL   Nitrite NEGATIVE  NEGATIVE   Leukocytes, UA NEGATIVE  NEGATIVE   Comment: MICROSCOPIC NOT DONE ON URINES WITH NEGATIVE PROTEIN, BLOOD, LEUKOCYTES, NITRITE, OR GLUCOSE <1000 mg/dL.  POCT PREGNANCY, URINE     Status: None   Collection Time    10/08/13  7:14 PM      Result Value Range   Preg Test, Ur NEGATIVE  NEGATIVE   Comment:            THE SENSITIVITY OF THIS     METHODOLOGY IS >24 mIU/mL  BASIC METABOLIC PANEL     Status: Abnormal   Collection Time    10/09/13  4:05 AM      Result Value Range   Sodium 136  135 - 145 mEq/L   Potassium 3.6  3.5 - 5.1 mEq/L   Comment: DELTA CHECK NOTED  Chloride 105  96 - 112 mEq/L   CO2 22  19 - 32 mEq/L   Glucose, Bld 108 (*) 70 - 99 mg/dL   BUN 8  6 - 23 mg/dL   Creatinine, Ser 1.61  0.50 - 1.10 mg/dL   Calcium 7.9 (*) 8.4 - 10.5 mg/dL   GFR calc non Af Amer >90  >90 mL/min   GFR calc Af Amer >90  >90 mL/min   Comment: (NOTE)     The  eGFR has been calculated using the CKD EPI equation.     This calculation has not been validated in all clinical situations.     eGFR's persistently <90 mL/min signify possible Chronic Kidney     Disease.  CBC     Status: Abnormal   Collection Time    10/09/13  4:05 AM      Result Value Range   WBC 14.3 (*) 4.0 - 10.5 K/uL   RBC 3.67 (*) 3.87 - 5.11 MIL/uL   Hemoglobin 11.8 (*) 12.0 - 15.0 g/dL   Comment: DELTA CHECK NOTED     REPEATED TO VERIFY   HCT 33.2 (*) 36.0 - 46.0 %   MCV 90.5  78.0 - 100.0 fL   MCH 32.2  26.0 - 34.0 pg   MCHC 35.5  30.0 - 36.0 g/dL   RDW 09.6  04.5 - 40.9 %   Platelets 168  150 - 400 K/uL   No results found.     Assessment/Plan 1. Right axillary abscess and cellulitis 2. H/o axillary abscesses requiring prior I&D 3. PCOS  Plan: 1. NPO 2. To OR later today for I&D of right axillary abscess 3. Agree with current abx therapy.  Will take cultures in OR. 4. Hold Lovenox 5. This was d/w the patient who is agreeable to the plan outlined above.  OSBORNE,KELLY E 10/09/2013, 12:02 PM Pager: [phone]  Agree with above. I already have a brief eval note in Epic. For I&D later today.  Ovidio Kin, MD, [facility] Surgery Pager: [phone] Office phone:  [phone]

## 2013-10-10 ENCOUNTER — Encounter (HOSPITAL_COMMUNITY): Payer: Self-pay | Admitting: Surgery

## 2013-10-10 DIAGNOSIS — L02411 Cutaneous abscess of right axilla: Secondary | ICD-10-CM | POA: Diagnosis present

## 2013-10-10 DIAGNOSIS — K219 Gastro-esophageal reflux disease without esophagitis: Secondary | ICD-10-CM

## 2013-10-10 LAB — CBC
MCV: 89.2 fL (ref 78.0–100.0)
Platelets: 154 10*3/uL (ref 150–400)
RDW: 12.7 % (ref 11.5–15.5)
WBC: 15.7 10*3/uL — ABNORMAL HIGH (ref 4.0–10.5)

## 2013-10-10 MED ORDER — OXYCODONE-ACETAMINOPHEN 5-325 MG PO TABS
1.0000 | ORAL_TABLET | ORAL | Status: DC | PRN
  Administered 2013-10-10 – 2013-10-11 (×2): 2 via ORAL
  Administered 2013-10-11: 1 via ORAL
  Filled 2013-10-10 (×3): qty 2
  Filled 2013-10-10: qty 1

## 2013-10-10 NOTE — Progress Notes (Signed)
Patient ID: Stacie Carrillo, female   DOB: 1980/10/26, 32 y.o.   MRN: [id number] 1 Day Post-Op  Subjective: Pt feels much better today.  Can move her arm better.  Less pain  Objective: Vital signs in last 24 hours: Temp:  [97.9 F (36.6 C)-101.3 F (38.5 C)] 98.1 F (36.7 C) (12/23 1029) Pulse Rate:  [74-107] 78 (12/23 1029) Resp:  [14-21] 18 (12/23 1029) BP: (106-137)/(45-88) 110/70 mmHg (12/23 1029) SpO2:  [96 %-100 %] 98 % (12/23 1029) Last BM Date: 10/08/13  Intake/Output from previous day: 12/22 0701 - 12/23 0700 In: 2400 [P.O.:800; I.V.:1600] Out: 550 [Urine:550] Intake/Output this shift: Total I/O In: 250 [P.O.:250] Out: 200 [Urine:200]  PE: Skin: right axillary wound is clean today,  Packing removed and replaced.  Erythema improved  Lab Results:   Recent Labs  10/09/13 0405 10/10/13 0525  WBC 14.3* 15.7*  HGB 11.8* 10.8*  HCT 33.2* 30.5*  PLT 168 154   BMET  Recent Labs  10/08/13 1856 10/09/13 0405  NA 137 136  K 4.8 3.6  CL 103 105  CO2  --  22  GLUCOSE 91 108*  BUN 6 8  CREATININE 0.70 0.73  CALCIUM  --  7.9*   PT/INR No results found for this basename: LABPROT, INR,  in the last 72 hours CMP     Component Value Date/Time   NA 136 10/09/2013 0405   K 3.6 10/09/2013 0405   CL 105 10/09/2013 0405   CO2 22 10/09/2013 0405   GLUCOSE 108* 10/09/2013 0405   BUN 8 10/09/2013 0405   CREATININE 0.73 10/09/2013 0405   CALCIUM 7.9* 10/09/2013 0405   PROT 7.7 05/05/2011 1135   ALBUMIN 2.7* 05/05/2011 1135   AST 10 05/05/2011 1135   ALT 5 05/05/2011 1135   ALKPHOS 148* 05/05/2011 1135   BILITOT 0.9 05/05/2011 1135   GFRNONAA >90 10/09/2013 0405   GFRAA >90 10/09/2013 0405   Lipase     Component Value Date/Time   LIPASE 27 12/04/2008 0230       Studies/Results: No results found.  Anti-infectives: Anti-infectives   Start     Dose/Rate Route Frequency Ordered Stop   10/08/13 2200  vancomycin (VANCOCIN) 1,500 mg in sodium chloride 0.9 % 500  mL IVPB     1,500 mg 250 mL/hr over 120 Minutes Intravenous Every 12 hours 10/08/13 2115     10/08/13 1845  clindamycin (CLEOCIN) IVPB 600 mg     600 mg 100 mL/hr over 30 Minutes Intravenous  Once 10/08/13 1835 10/08/13 1927       Assessment/Plan  1. POD1, s/p I&D of right axillary abscess  Plan: 1. Patient had a fever over night and WBC increased slightly.  Suspect that is just reactivity to surgical procedure.  Given erythema and fever, would like for patient to have one more day of IV abx therapy and then look at dc home tomorrow. 2. Will go ahead and arrange [facility] services for dressing changes.   LOS: 2 days    Lorynn Moeser E 10/10/2013, 12:31 PM Pager: [phone]

## 2013-10-10 NOTE — Progress Notes (Signed)
Agree with A&P of KO,PA. She is very comfortable this afternoon and looking forward to discharge tomorrow. Overall feels much improved

## 2013-10-10 NOTE — Care Management Note (Signed)
    Page 1 of 1   10/10/2013     2:16:41 PM   CARE MANAGEMENT NOTE 10/10/2013  Patient:  Stacie Carrillo, Stacie Carrillo   Account Number:  [id number]  Date Initiated:  10/10/2013  Documentation initiated by:  Lorenda Ishihara  Subjective/Objective Assessment:   32 yo female admitted s/p I&D of axillary abscess. PTA lived at home with family.     Action/Plan:   Home when stable.   Anticipated DC Date:  10/10/2013   Anticipated DC Plan:  HOME W HOME HEALTH SERVICES      DC Planning Services  CM consult      [facility] Choice  HOME HEALTH   Choice offered to / List presented to:  C-1 Patient        HH arranged  HH-1 RN  HH-10 DISEASE MANAGEMENT      HH agency  [facility]   Status of service:  Completed, signed off Medicare Important Message given?   (If response is "NO", the following Medicare IM given date fields will be blank) Date Medicare IM given:   Date Additional Medicare IM given:    Discharge Disposition:  HOME W HOME HEALTH SERVICES  Per UR Regulation:  Reviewed for med. necessity/level of care/duration of stay  If discussed at Long Length of Stay Meetings, dates discussed:    Comments:

## 2013-10-10 NOTE — Progress Notes (Addendum)
[facility] PROGRESS NOTE  Stacie Carrillo WUJ:[id number] DOB: 1980-11-05 DOA: 10/08/2013 PCP: Steva Ready, MD  Brief Narrative:Stacie Carrillo is a 32 y.o. female with history of gestational diabetes and polycystic ovarian syndrome presented to the ER with worsening pain and swelling in the right axillary area.  She was started on Iv Abx, seen by Surgery and underwent I&D 12/22, now improving.  Assessment/Plan: 1. R axillary abscess -s/p I&D 12/22 -continue IV Vanc,  -wound Cx-gram stain polymicrobial, cultures pending -Blood Cx negative -wound care -appreciate CCS assistance -IVF, pain control  2. Tobacco abuse  -counseled  3. Leukocytosis  -stable -blood cultures negative so far  4. History of polycystic ovarian syndrome and gestational diabetes  - hbaic 4.7  DVT proph: lovenox  Code Status: Full Code Family Communication: none at bedside Disposition Plan: home when improved   Consultants:  CCS pending  Antibiotics:  Vancomcyin  HPI/Subjective: Feels better, improved ROm of R arm  Objective: Filed Vitals:   10/10/13 1406  BP: 117/72  Pulse: 84  Temp: 98.6 F (37 C)  Resp: 20    Intake/Output Summary (Last 24 hours) at 10/10/13 1605 Last data filed at 10/10/13 1400  Gross per 24 hour  Intake 7476.67 ml  Output    200 ml  Net 7276.67 ml   Filed Weights   10/08/13 2020  Weight: 89.812 kg (198 lb)    Exam:   General:  AAOx3  Cardiovascular:S1S2/RRR  Respiratory: CTAB  Abdomen: soft, Nt, BS present  Musculoskeletal: no edema c/c, improved mobility of R arm   R axilla: wound with dressing/packing  Data Reviewed: Basic Metabolic Panel:  Recent Labs Lab 10/08/13 1856 10/09/13 0405  NA 137 136  K 4.8 3.6  CL 103 105  CO2  --  22  GLUCOSE 91 108*  BUN 6 8  CREATININE 0.70 0.73  CALCIUM  --  7.9*   Liver Function Tests: No results found for this basename: AST, ALT, ALKPHOS, BILITOT, PROT, ALBUMIN,  in the  last 168 hours No results found for this basename: LIPASE, AMYLASE,  in the last 168 hours No results found for this basename: AMMONIA,  in the last 168 hours CBC:  Recent Labs Lab 10/08/13 1843 10/08/13 1856 10/09/13 0405 10/10/13 0525  WBC 17.3*  --  14.3* 15.7*  NEUTROABS 13.3*  --   --   --   HGB 14.0 15.0 11.8* 10.8*  HCT 38.5 44.0 33.2* 30.5*  MCV 90.0  --  90.5 89.2  PLT 197  --  168 154   Cardiac Enzymes: No results found for this basename: CKTOTAL, CKMB, CKMBINDEX, TROPONINI,  in the last 168 hours BNP (last 3 results) No results found for this basename: PROBNP,  in the last 8760 hours CBG:  Recent Labs Lab 10/06/13 2204  GLUCAP 129*    Recent Results (from the past 240 hour(s))  CULTURE, BLOOD (ROUTINE X 2)     Status: None   Collection Time    10/08/13  6:42 PM      Result Value Range Status   Specimen Description BLOOD LEFT ARM   Final   Special Requests BOTTLES DRAWN AEROBIC AND ANAEROBIC [facility] EACH   Final   Culture  Setup Time     Final   Value: 10/09/2013 10:40     Performed at [facility]   Culture     Final   Value:        BLOOD CULTURE RECEIVED NO GROWTH TO DATE  CULTURE WILL BE HELD FOR 5 DAYS BEFORE ISSUING A FINAL NEGATIVE REPORT     Performed at [facility]   Report Status PENDING   Incomplete  CULTURE, BLOOD (ROUTINE X 2)     Status: None   Collection Time    10/08/13  6:56 PM      Result Value Range Status   Specimen Description BLOOD RIGHT ARM   Final   Special Requests BOTTLES DRAWN AEROBIC AND ANAEROBIC 5CC EACH   Final   Culture  Setup Time     Final   Value: 10/09/2013 10:40     Performed at [facility]   Culture     Final   Value:        BLOOD CULTURE RECEIVED NO GROWTH TO DATE CULTURE WILL BE HELD FOR 5 DAYS BEFORE ISSUING A FINAL NEGATIVE REPORT     Performed at [facility]   Report Status PENDING   Incomplete  SURGICAL PCR SCREEN     Status: None   Collection Time    10/09/13  1:39 PM       Result Value Range Status   MRSA, PCR NEGATIVE  NEGATIVE Final   Staphylococcus aureus NEGATIVE  NEGATIVE Final   Comment:            The Xpert SA Assay (FDA     approved for NASAL specimens     in patients over 38 years of age),     is one component of     a comprehensive surveillance     program.  Test performance has     been validated by [employer] for patients greater     than or equal to 2 year old.     It is not intended     to diagnose infection nor to     guide or monitor treatment.  CULTURE, ROUTINE-ABSCESS     Status: None   Collection Time    10/09/13  5:22 PM      Result Value Range Status   Specimen Description ABSCESS RIGHT AXILLARY   Final   Special Requests NONE   Final   Gram Stain     Final   Value: ABUNDANT WBC PRESENT, PREDOMINANTLY PMN     NO SQUAMOUS EPITHELIAL CELLS SEEN     MODERATE GRAM POSITIVE COCCI     IN PAIRS MODERATE GRAM NEGATIVE COCCOBACILLI     Performed at [facility]   Culture PENDING   Incomplete   Report Status PENDING   Incomplete  ANAEROBIC CULTURE     Status: None   Collection Time    10/09/13  5:22 PM      Result Value Range Status   Specimen Description ABSCESS RIGHT AXILLARY   Final   Special Requests NONE   Final   Gram Stain     Final   Value: ABUNDANT WBC PRESENT, PREDOMINANTLY PMN     NO SQUAMOUS EPITHELIAL CELLS SEEN     MODERATE GRAM POSITIVE COCCI     IN PAIRS MODERATE GRAM NEGATIVE COCCOBACILLI     Performed at [facility]   Culture     Final   Value: NO ANAEROBES ISOLATED; CULTURE IN PROGRESS FOR 5 DAYS     Performed at [facility]   Report Status PENDING   Incomplete     Studies: No results found.  Scheduled Meds: . nicotine  7 mg Transdermal Daily  . vancomycin  1,500 mg Intravenous Q12H   Continuous Infusions: . sodium chloride 100 mL/hr (10/09/13 2152)    Principal Problem:   Cellulitis of axilla, right Active Problems:   TOBACCO DEPENDENCE   Esophageal reflux    Cellulitis    Time spent:    [facility]  [facility] Pager [phone]. If 7PM-7AM, please contact night-coverage at [email or web address], password [facility] 10/10/2013, 4:05 PM  LOS: 2 days

## 2013-10-11 MED ORDER — PROMETHAZINE HCL 12.5 MG PO TABS: 12.5000 mg | ORAL_TABLET | Freq: Four times a day (QID) | ORAL | Status: DC | PRN

## 2013-10-11 MED ORDER — CLINDAMYCIN HCL 300 MG PO CAPS: 300.0000 mg | ORAL_CAPSULE | Freq: Three times a day (TID) | ORAL | Status: DC

## 2013-10-11 MED ORDER — SULFAMETHOXAZOLE-TMP DS 800-160 MG PO TABS: 1.0000 | ORAL_TABLET | Freq: Two times a day (BID) | ORAL | Status: DC

## 2013-10-11 MED ORDER — HYDROCODONE-ACETAMINOPHEN 5-325 MG PO TABS: 1.0000 | ORAL_TABLET | ORAL | Status: DC | PRN

## 2013-10-11 NOTE — Progress Notes (Signed)
General Surgery Note  LOS: 3 days  POD -  2 Days Post-Op  Assessment/Plan: 1.  IRRIGATION AND DEBRIDEMENT RIGHT AXILLARY ABSCESS - 10/10/2013 - D. Diarra Ceja  Vancomycin  Wound looks good.  Cultures pending, but gram stain - gram positive cocci and gram neg coccobacilli.  Okay to go home today.  I reviewed dressing instructions with patient.  Should see me back in 2 weeks.  Call our office [phone]) for appointment.   Principal Problem:   Cellulitis of axilla, right Active Problems:   TOBACCO DEPENDENCE   Esophageal reflux   Cellulitis   Abscess of axilla, right   Subjective:  Still tender right axilla, but better. Objective:   Filed Vitals:   10/11/13 0556  BP: 110/62  Pulse: 75  Temp: 98.5 F (36.9 C)  Resp: 16     Intake/Output from previous day:  12/23 0701 - 12/24 0700 In: 6996.7 [P.O.:490; I.V.:4506.7; IV Piggyback:2000] Out: 450 [Urine:450]  Intake/Output this shift:  Total I/O In: 1200 [I.V.:1200] Out: 250 [Urine:250]   Physical Exam:   General: WN AA F who is alert and oriented.    HEENT: Normal. Pupils equal.   Wound: Clean with minimal drainage.     Lab Results:    Recent Labs  10/09/13 0405 10/10/13 0525  WBC 14.3* 15.7*  HGB 11.8* 10.8*  HCT 33.2* 30.5*  PLT 168 154    BMET   Recent Labs  10/08/13 1856 10/09/13 0405  NA 137 136  K 4.8 3.6  CL 103 105  CO2  --  22  GLUCOSE 91 108*  BUN 6 8  CREATININE 0.70 0.73  CALCIUM  --  7.9*    PT/INR  No results found for this basename: LABPROT, INR,  in the last 72 hours  ABG  No results found for this basename: PHART, PCO2, PO2, HCO3,  in the last 72 hours   Studies/Results:  No results found.   Anti-infectives:   Anti-infectives   Start     Dose/Rate Route Frequency Ordered Stop   10/08/13 2200  vancomycin (VANCOCIN) 1,500 mg in sodium chloride 0.9 % 500 mL IVPB     1,500 mg 250 mL/hr over 120 Minutes Intravenous Every 12 hours 10/08/13 2115     10/08/13 1845  clindamycin  (CLEOCIN) IVPB 600 mg     600 mg 100 mL/hr over 30 Minutes Intravenous  Once 10/08/13 1835 10/08/13 1927     Ovidio Kin, MD, FACS Pager: [phone] [place] [facility] Office: [phone] 10/11/2013

## 2013-10-11 NOTE — Discharge Summary (Signed)
Physician Discharge Summary  KMYA PLACIDE ZOX:[id number] DOB: 11-15-1980 DOA: 10/08/2013  PCP: Steva Ready, MD  Admit date: 10/08/2013 Discharge date: 10/11/2013  Recommendations for Outpatient Follow-up:  1. Pt will need to follow up with PCP in 2-3 weeks post discharge 2. Please obtain BMP to evaluate electrolytes and kidney function 3. Please also check CBC to evaluate Hg and Hct levels 4. Please note that pt was discharged on Bactrim to complete therapy for the axillary abscess, per ID recommendations   Discharge Diagnoses: Cellulitis of the right axilla  Principal Problem:   Cellulitis of axilla, right Active Problems:   TOBACCO DEPENDENCE   Esophageal reflux   Cellulitis   Abscess of axilla, right  Discharge Condition: Stable  Diet recommendation: Heart healthy diet discussed in details   Brief Narrative: Stacie Carrillo is a 32 y.o. female with history of gestational diabetes and polycystic ovarian syndrome presented to the ER with worsening pain and swelling in the right axillary area. She was started on Iv Abx, seen by Surgery and underwent I&D 12/22, now improving.  Assessment/Plan:  1. R axillary abscess -s/p I&D 12/22  -continued IV Vanc, and transitioned to oral ABX Bactrim per ID recommendations -wound Cx-gram stain polymicrobial, final cultures pending upon dischartge  -Blood Cx negative  -wound care  -appreciate CCS assistance  2. Tobacco abuse  -counseled  3. Leukocytosis  -stable  -blood cultures negative so far  4. History of polycystic ovarian syndrome and gestational diabetes  - hbaic 4.7   DVT proph: lovenox  Code Status: Full Code  Family Communication: none at bedside  Disposition Plan: pt insisting on going home   Consultants:  CCS pending Antibiotics:  Vancomycin  Bactrim  Discharge Exam: Filed Vitals:   10/11/13 0556  BP: 110/62  Pulse: 75  Temp: 98.5 F (36.9 C)  Resp: 16   Filed Vitals:   10/10/13 1406  10/10/13 1807 10/10/13 2054 10/11/13 0556  BP: 117/72 120/73 102/62 110/62  Pulse: 84 80 97 75  Temp: 98.6 F (37 C) 98.2 F (36.8 C) 98.6 F (37 C) 98.5 F (36.9 C)  TempSrc: Oral Oral Oral Oral  Resp: 20 20 20 16   Height:      Weight:      SpO2: 100% 100% 100% 100%    General: Pt is alert, follows commands appropriately, not in acute distress Cardiovascular: Regular rate and rhythm, S1/S2 +, no murmurs, no rubs, no gallops Respiratory: Clear to auscultation bilaterally, no wheezing, no crackles, no rhonchi Abdominal: Soft, non tender, non distended, bowel sounds +, no guarding Neuro: Grossly nonfocal  Discharge Instructions  Discharge Orders   Future Orders Complete By Expires   Diet - low sodium heart healthy  As directed    Increase activity slowly  As directed        Medication List    STOP taking these medications       doxycycline 100 MG capsule  Commonly known as:  VIBRAMYCIN      TAKE these medications       HYDROcodone-acetaminophen 5-325 MG per tablet  Commonly known as:  NORCO/VICODIN  Take 1 tablet by mouth every 4 (four) hours as needed for moderate pain.     promethazine 12.5 MG tablet  Commonly known as:  PHENERGAN  Take 1 tablet (12.5 mg total) by mouth every 6 (six) hours as needed for nausea or vomiting.     sulfamethoxazole-trimethoprim 800-160 MG per tablet  Commonly known as:  BACTRIM DS  Take  1 tablet by mouth 2 (two) times daily.           Follow-up Information   Follow up with Steva Ready, MD In 2 weeks.   Specialty:  Family Medicine   Contact information:   [facility] [address] [place] [address] [phone]       Follow up with Debbora Presto, MD. (As needed if symptoms worsen call my cell phone [phone])    Specialty:  Internal Medicine   Contact information:   [address] [place] [address] [phone]        The results of significant diagnostics from this  hospitalization (including imaging, microbiology, ancillary and laboratory) are listed below for reference.     Microbiology: Recent Results (from the past 240 hour(s))  CULTURE, BLOOD (ROUTINE X 2)     Status: None   Collection Time    10/08/13  6:42 PM      Result Value Range Status   Specimen Description BLOOD LEFT ARM   Final   Special Requests BOTTLES DRAWN AEROBIC AND ANAEROBIC 5CC EACH   Final   Culture  Setup Time     Final   Value: 10/09/2013 10:40     Performed at [facility]   Culture     Final   Value:        BLOOD CULTURE RECEIVED NO GROWTH TO DATE CULTURE WILL BE HELD FOR 5 DAYS BEFORE ISSUING A FINAL NEGATIVE REPORT     Performed at [facility]   Report Status PENDING   Incomplete  CULTURE, BLOOD (ROUTINE X 2)     Status: None   Collection Time    10/08/13  6:56 PM      Result Value Range Status   Specimen Description BLOOD RIGHT ARM   Final   Special Requests BOTTLES DRAWN AEROBIC AND ANAEROBIC 5CC EACH   Final   Culture  Setup Time     Final   Value: 10/09/2013 10:40     Performed at [facility]   Culture     Final   Value:        BLOOD CULTURE RECEIVED NO GROWTH TO DATE CULTURE WILL BE HELD FOR 5 DAYS BEFORE ISSUING A FINAL NEGATIVE REPORT     Performed at [facility]   Report Status PENDING   Incomplete  SURGICAL PCR SCREEN     Status: None   Collection Time    10/09/13  1:39 PM      Result Value Range Status   MRSA, PCR NEGATIVE  NEGATIVE Final   Staphylococcus aureus NEGATIVE  NEGATIVE Final   Comment:            The Xpert SA Assay (FDA     approved for NASAL specimens     in patients over 37 years of age),     is one component of     a comprehensive surveillance     program.  Test performance has     been validated by [employer] for patients greater     than or equal to 50 year old.     It is not intended     to diagnose infection nor to     guide or monitor treatment.  CULTURE, ROUTINE-ABSCESS      Status: None   Collection Time    10/09/13  5:22 PM      Result Value Range Status  Specimen Description ABSCESS RIGHT AXILLARY   Final   Special Requests NONE   Final   Gram Stain     Final   Value: ABUNDANT WBC PRESENT, PREDOMINANTLY PMN     NO SQUAMOUS EPITHELIAL CELLS SEEN     MODERATE GRAM POSITIVE COCCI     IN PAIRS MODERATE GRAM NEGATIVE COCCOBACILLI     Performed at [facility]   Culture     Final   Value: Culture reincubated for better growth     Performed at [facility]   Report Status PENDING   Incomplete  ANAEROBIC CULTURE     Status: None   Collection Time    10/09/13  5:22 PM      Result Value Range Status   Specimen Description ABSCESS RIGHT AXILLARY   Final   Special Requests NONE   Final   Gram Stain     Final   Value: ABUNDANT WBC PRESENT, PREDOMINANTLY PMN     NO SQUAMOUS EPITHELIAL CELLS SEEN     MODERATE GRAM POSITIVE COCCI     IN PAIRS MODERATE GRAM NEGATIVE COCCOBACILLI     Performed at [facility]   Culture     Final   Value: NO ANAEROBES ISOLATED; CULTURE IN PROGRESS FOR 5 DAYS     Performed at [facility]   Report Status PENDING   Incomplete  AFB CULTURE WITH SMEAR     Status: None   Collection Time    10/09/13  5:22 PM      Result Value Range Status   Specimen Description ABSCESS RIGHT AXILLARY   Final   Special Requests NONE   Final   ACID FAST SMEAR     Final   Value: NO ACID FAST BACILLI SEEN     Performed at [facility]   Culture     Final   Value: CULTURE WILL BE EXAMINED FOR 6 WEEKS BEFORE ISSUING A FINAL REPORT     Performed at [facility]   Report Status PENDING   Incomplete     Labs: Basic Metabolic Panel:  Recent Labs Lab 10/08/13 1856 10/09/13 0405  NA 137 136  K 4.8 3.6  CL 103 105  CO2  --  22  GLUCOSE 91 108*  BUN 6 8  CREATININE 0.70 0.73  CALCIUM  --  7.9*   CBC:  Recent Labs Lab 10/08/13 1843 10/08/13 1856 10/09/13 0405 10/10/13 0525  WBC 17.3*  --   14.3* 15.7*  NEUTROABS 13.3*  --   --   --   HGB 14.0 15.0 11.8* 10.8*  HCT 38.5 44.0 33.2* 30.5*  MCV 90.0  --  90.5 89.2  PLT 197  --  168 154   CBG:  Recent Labs Lab 10/06/13 2204  GLUCAP 129*   SIGNED: Time coordinating discharge: Over 30 minutes  Debbora Presto, MD  [facility] 10/11/2013, 11:22 AM Pager [phone]  If 7PM-7AM, please contact night-coverage [email or web address] Password [email or web address]

## 2013-10-13 LAB — CULTURE, ROUTINE-ABSCESS

## 2013-10-14 LAB — ANAEROBIC CULTURE

## 2013-10-15 LAB — CULTURE, BLOOD (ROUTINE X 2): Culture: NO GROWTH

## 2013-11-21 LAB — AFB CULTURE WITH SMEAR (NOT AT ARMC): Acid Fast Smear: NONE SEEN

## 2014-03-08 ENCOUNTER — Emergency Department (HOSPITAL_COMMUNITY)
Admission: EM | Admit: 2014-03-08 | Discharge: 2014-03-08 | Disposition: A | Discharge status: Home / Self Care | Payer: Medicaid Other | Attending: Emergency Medicine | Admitting: Emergency Medicine

## 2014-03-08 ENCOUNTER — Encounter (HOSPITAL_COMMUNITY): Payer: Self-pay | Admitting: Emergency Medicine

## 2014-03-08 DIAGNOSIS — Z862 Personal history of diseases of the blood and blood-forming organs and certain disorders involving the immune mechanism: Secondary | ICD-10-CM | POA: Insufficient documentation

## 2014-03-08 DIAGNOSIS — Z8742 Personal history of other diseases of the female genital tract: Secondary | ICD-10-CM | POA: Insufficient documentation

## 2014-03-08 DIAGNOSIS — K2970 Gastritis, unspecified, without bleeding: Secondary | ICD-10-CM | POA: Insufficient documentation

## 2014-03-08 DIAGNOSIS — Z8659 Personal history of other mental and behavioral disorders: Secondary | ICD-10-CM | POA: Insufficient documentation

## 2014-03-08 DIAGNOSIS — E669 Obesity, unspecified: Secondary | ICD-10-CM | POA: Insufficient documentation

## 2014-03-08 DIAGNOSIS — F17200 Nicotine dependence, unspecified, uncomplicated: Secondary | ICD-10-CM | POA: Insufficient documentation

## 2014-03-08 DIAGNOSIS — K2990 Gastroduodenitis, unspecified, without bleeding: Principal | ICD-10-CM

## 2014-03-08 DIAGNOSIS — Z91040 Latex allergy status: Secondary | ICD-10-CM | POA: Insufficient documentation

## 2014-03-08 DIAGNOSIS — Z9851 Tubal ligation status: Secondary | ICD-10-CM | POA: Insufficient documentation

## 2014-03-08 DIAGNOSIS — Z872 Personal history of diseases of the skin and subcutaneous tissue: Secondary | ICD-10-CM | POA: Insufficient documentation

## 2014-03-08 DIAGNOSIS — Z8632 Personal history of gestational diabetes: Secondary | ICD-10-CM | POA: Insufficient documentation

## 2014-03-08 LAB — COMPREHENSIVE METABOLIC PANEL
ALBUMIN: 3.6 g/dL (ref 3.5–5.2)
ALT: 6 U/L (ref 0–35)
AST: 10 U/L (ref 0–37)
Alkaline Phosphatase: 54 U/L (ref 39–117)
BUN: 8 mg/dL (ref 6–23)
CALCIUM: 8.9 mg/dL (ref 8.4–10.5)
CO2: 25 mEq/L (ref 19–32)
CREATININE: 0.71 mg/dL (ref 0.50–1.10)
Chloride: 105 mEq/L (ref 96–112)
GFR calc Af Amer: >90 mL/min (ref >90)
GFR calc non Af Amer: >90 mL/min (ref >90)
Glucose, Bld: 89 mg/dL (ref 70–99)
Potassium: 4.5 mEq/L (ref 3.7–5.3)
Sodium: 142 mEq/L (ref 137–147)
TOTAL PROTEIN: 7.1 g/dL (ref 6.0–8.3)
Total Bilirubin: 0.7 mg/dL (ref 0.3–1.2)

## 2014-03-08 LAB — URINALYSIS, ROUTINE W REFLEX MICROSCOPIC
Bilirubin Urine: NEGATIVE
Glucose, UA: NEGATIVE mg/dL
Hgb urine dipstick: NEGATIVE
KETONES UR: NEGATIVE mg/dL
LEUKOCYTES UA: NEGATIVE
NITRITE: NEGATIVE
Protein, ur: NEGATIVE mg/dL
Specific Gravity, Urine: 1.019 (ref 1.005–1.030)
Urobilinogen, UA: 1 mg/dL (ref 0.0–1.0)
pH: 7 (ref 5.0–8.0)

## 2014-03-08 LAB — CBC WITH DIFFERENTIAL/PLATELET
BASOS ABS: 0.1 10*3/uL (ref 0.0–0.1)
Basophils Relative: 1 % (ref 0–1)
EOS PCT: 3 % (ref 0–5)
Eosinophils Absolute: 0.2 10*3/uL (ref 0.0–0.7)
HCT: 38.4 % (ref 36.0–46.0)
Hemoglobin: 13.8 g/dL (ref 12.0–15.0)
LYMPHS PCT: 26 % (ref 12–46)
Lymphs Abs: 2.5 10*3/uL (ref 0.7–4.0)
MCH: 32.1 pg (ref 26.0–34.0)
MCHC: 35.9 g/dL (ref 30.0–36.0)
MCV: 89.3 fL (ref 78.0–100.0)
Monocytes Absolute: 0.7 10*3/uL (ref 0.1–1.0)
Monocytes Relative: 8 % (ref 3–12)
NEUTROS ABS: 5.9 10*3/uL (ref 1.7–7.7)
Neutrophils Relative %: 62 % (ref 43–77)
PLATELETS: 219 10*3/uL (ref 150–400)
RBC: 4.3 MIL/uL (ref 3.87–5.11)
RDW: 13.1 % (ref 11.5–15.5)
WBC: 9.4 10*3/uL (ref 4.0–10.5)

## 2014-03-08 LAB — LIPASE, BLOOD: Lipase: 21 U/L (ref 11–59)

## 2014-03-08 MED ORDER — SODIUM CHLORIDE 0.9 % IV SOLN: INTRAVENOUS | Status: DC

## 2014-03-08 MED ORDER — PANTOPRAZOLE SODIUM 40 MG IV SOLR
40.0000 mg | Freq: Once | INTRAVENOUS | Status: AC
  Administered 2014-03-08: 40 mg via INTRAVENOUS
  Filled 2014-03-08: qty 40

## 2014-03-08 MED ORDER — DEXLANSOPRAZOLE 30 MG PO CPDR: 30.0000 mg | DELAYED_RELEASE_CAPSULE | Freq: Every day | ORAL | Status: DC

## 2014-03-08 MED ORDER — SODIUM CHLORIDE 0.9 % IV BOLUS (SEPSIS)
500.0000 mL | Freq: Once | INTRAVENOUS | Status: AC
  Administered 2014-03-08: 500 mL via INTRAVENOUS

## 2014-03-08 MED ORDER — ONDANSETRON HCL 4 MG/2ML IJ SOLN
4.0000 mg | Freq: Once | INTRAMUSCULAR | Status: AC
  Administered 2014-03-08: 4 mg via INTRAVENOUS
  Filled 2014-03-08: qty 2

## 2014-03-08 NOTE — ED Provider Notes (Signed)
CSN: [id number]     Arrival date & time 03/08/14  1036 History   First MD Initiated Contact with Patient 03/08/14 1054     Chief Complaint  Patient presents with  . Hematemesis  . Abdominal Pain  . Diarrhea     (Consider location/radiation/quality/duration/timing/severity/associated sxs/prior Treatment) Patient is a 33 y.o. female presenting with abdominal pain and diarrhea. The history is provided by the patient.  Abdominal Pain Associated symptoms: diarrhea   Diarrhea Associated symptoms: abdominal pain    She complains of upper abdominal pain, for 2 days, associated with nausea, vomiting, and an episode of red color, emesis, this morning. She's had occasional diarrhea. She denies fever, chills, cough, shortness of breath, chest pain, weakness, or dizziness. Her last menstrual period started one week ago. She ran out of her medication, for "ulcers", 2 months ago. She smokes cigarettes. There are no other known modifying factors.   Past Medical History  Diagnosis Date  . Depression   . Polycystic ovarian syndrome   . Obesity   . Gestational diabetes   . Reflux   . Anemia   . Anxiety   . Ulcer    Past Surgical History  Procedure Laterality Date  . Tubal ligation    . Cesarean section      x4  . Irrigation and debridement abscess Right 10/09/2013    Procedure: IRRIGATION AND DEBRIDEMENT RIGHT AXILLARY ABSCESS;  Surgeon: Kandis Cockingavid H Newman, MD;  Location: [facility];  Service: General;  Laterality: Right;   Family History  Problem Relation Age of Onset  . Throat cancer Maternal Aunt   . Cirrhosis Mother   . Diabetes Paternal Grandmother    History  Substance Use Topics  . Smoking status: Current Every Day Smoker -- 0.25 packs/day for 19 years    Types: Cigarettes  . Smokeless tobacco: Never Used  . Alcohol Use: No   OB History   Grav Para Term Preterm Abortions TAB SAB Ect Mult Living   4 4 4  0 0 0 0 0 0 4     Review of Systems  Gastrointestinal: Positive for  abdominal pain and diarrhea.  All other systems reviewed and are negative.     Allergies  Latex  Home Medications   Prior to Admission medications   Medication Sig Start Date End Date Taking? Authorizing Provider  Aspirin-Salicylamide-Caffeine (BC HEADACHE POWDER PO) Take 1 packet by mouth as needed (headache).   Yes Historical Provider, MD   BP 115/72  Pulse 65  Temp(Src) 97.9 F (36.6 C) (Oral)  Resp 18  SpO2 100% Physical Exam  Nursing note and vitals reviewed. Constitutional: She is oriented to person, place, and time. She appears well-developed and well-nourished. No distress.  HENT:  Head: Normocephalic and atraumatic.  Eyes: Conjunctivae and EOM are normal. Pupils are equal, round, and reactive to light.  Neck: Normal range of motion and phonation normal. Neck supple.  Cardiovascular: Normal rate, regular rhythm and intact distal pulses.   Pulmonary/Chest: Effort normal and breath sounds normal. She exhibits no tenderness.  Abdominal: Soft. She exhibits no distension and no mass. There is tenderness (Epigastric, mild). There is no rebound and no guarding.  Musculoskeletal: Normal range of motion.  Neurological: She is alert and oriented to person, place, and time. She exhibits normal muscle tone.  Skin: Skin is warm and dry.  Psychiatric: She has a normal mood and affect. Her behavior is normal. Judgment and thought content normal.    ED Course  Procedures (including critical  care time)  Medications  0.9 %  sodium chloride infusion (not administered)  sodium chloride 0.9 % bolus 500 mL (0 mLs Intravenous Stopped 03/08/14 1330)  ondansetron (ZOFRAN) injection 4 mg (4 mg Intravenous Given 03/08/14 1133)  pantoprazole (PROTONIX) injection 40 mg (40 mg Intravenous Given 03/08/14 1133)    Patient Vitals for the past 24 hrs:  BP Temp Temp src Pulse Resp SpO2  03/08/14 1331 115/72 mmHg - - 65 18 100 %  03/08/14 1045 134/90 mmHg 97.9 F (36.6 C) Oral 80 16 100 %     12:51 PM Reevaluation with update and discussion. After initial assessment and treatment, an updated evaluation reveals she is comfortable. Findings discussed with patient, all questions answered. Flint MelterElliott L Germaine Shenker     Labs Review Labs Reviewed  CBC WITH DIFFERENTIAL  URINALYSIS, ROUTINE W REFLEX MICROSCOPIC  COMPREHENSIVE METABOLIC PANEL  LIPASE, BLOOD    Imaging Review No results found.   EKG Interpretation None      MDM   Final diagnoses:  Gastritis    Evaluation is consistent with gastritis versus peptic ulcer, disease. She is now stable with a normal blood cell count and normal Hemoglobin.  Nursing Notes Reviewed/ Care Coordinated Applicable Imaging Reviewed Interpretation of Laboratory Data incorporated into ED treatment  The patient appears reasonably screened and/or stabilized for discharge and I doubt any other medical condition or other [facility] requiring further screening, evaluation, or treatment in the ED at this time prior to discharge.  Plan: Home Medications- Dexalant; Home Treatments- rest; return here if the recommended treatment, does not improve the symptoms; Recommended follow up- PCP 1-2 weeks     Flint MelterElliott L Stephinie Battisti, MD 03/08/14 1343

## 2014-03-08 NOTE — Discharge Instructions (Signed)

## 2014-03-08 NOTE — ED Notes (Signed)
Pt reports generalized abd pain for past 2 days with on episode emesis. States she think she saw blood in emesis. Pt has hx of gastric ulcers. Also some diarrhea.

## 2014-06-04 ENCOUNTER — Encounter (HOSPITAL_COMMUNITY): Payer: Self-pay | Admitting: Emergency Medicine

## 2014-06-04 ENCOUNTER — Emergency Department (HOSPITAL_COMMUNITY)
Admission: EM | Admit: 2014-06-04 | Discharge: 2014-06-04 | Disposition: A | Discharge status: Home / Self Care | Payer: No Typology Code available for payment source | Attending: Emergency Medicine | Admitting: Emergency Medicine

## 2014-06-04 DIAGNOSIS — R093 Abnormal sputum: Secondary | ICD-10-CM | POA: Insufficient documentation

## 2014-06-04 DIAGNOSIS — Z8632 Personal history of gestational diabetes: Secondary | ICD-10-CM | POA: Insufficient documentation

## 2014-06-04 DIAGNOSIS — Z91040 Latex allergy status: Secondary | ICD-10-CM | POA: Insufficient documentation

## 2014-06-04 DIAGNOSIS — K219 Gastro-esophageal reflux disease without esophagitis: Secondary | ICD-10-CM | POA: Insufficient documentation

## 2014-06-04 DIAGNOSIS — IMO0002 Reserved for concepts with insufficient information to code with codable children: Secondary | ICD-10-CM | POA: Insufficient documentation

## 2014-06-04 DIAGNOSIS — E669 Obesity, unspecified: Secondary | ICD-10-CM | POA: Insufficient documentation

## 2014-06-04 DIAGNOSIS — Z8659 Personal history of other mental and behavioral disorders: Secondary | ICD-10-CM | POA: Insufficient documentation

## 2014-06-04 DIAGNOSIS — Z862 Personal history of diseases of the blood and blood-forming organs and certain disorders involving the immune mechanism: Secondary | ICD-10-CM | POA: Insufficient documentation

## 2014-06-04 DIAGNOSIS — F17200 Nicotine dependence, unspecified, uncomplicated: Secondary | ICD-10-CM | POA: Insufficient documentation

## 2014-06-04 DIAGNOSIS — R209 Unspecified disturbances of skin sensation: Secondary | ICD-10-CM | POA: Insufficient documentation

## 2014-06-04 DIAGNOSIS — L02411 Cutaneous abscess of right axilla: Secondary | ICD-10-CM

## 2014-06-04 LAB — CBC WITH DIFFERENTIAL/PLATELET
BASOS ABS: 0.1 10*3/uL (ref 0.0–0.1)
Basophils Relative: 1 % (ref 0–1)
Eosinophils Absolute: 0.2 10*3/uL (ref 0.0–0.7)
Eosinophils Relative: 2 % (ref 0–5)
HCT: 38.9 % (ref 36.0–46.0)
Hemoglobin: 14 g/dL (ref 12.0–15.0)
LYMPHS ABS: 3 10*3/uL (ref 0.7–4.0)
LYMPHS PCT: 28 % (ref 12–46)
MCH: 32.6 pg (ref 26.0–34.0)
MCHC: 36 g/dL (ref 30.0–36.0)
MCV: 90.5 fL (ref 78.0–100.0)
Monocytes Absolute: 1 10*3/uL (ref 0.1–1.0)
Monocytes Relative: 10 % (ref 3–12)
NEUTROS ABS: 6.6 10*3/uL (ref 1.7–7.7)
NEUTROS PCT: 61 % (ref 43–77)
PLATELETS: 240 10*3/uL (ref 150–400)
RBC: 4.3 MIL/uL (ref 3.87–5.11)
RDW: 12.7 % (ref 11.5–15.5)
WBC: 10.9 10*3/uL — AB (ref 4.0–10.5)

## 2014-06-04 LAB — BASIC METABOLIC PANEL
Anion gap: 10 (ref 5–15)
BUN: 11 mg/dL (ref 6–23)
CALCIUM: 9.2 mg/dL (ref 8.4–10.5)
CO2: 23 meq/L (ref 19–32)
Chloride: 105 mEq/L (ref 96–112)
Creatinine, Ser: 0.66 mg/dL (ref 0.50–1.10)
GFR calc Af Amer: >90 mL/min (ref >90)
GFR calc non Af Amer: >90 mL/min (ref >90)
GLUCOSE: 79 mg/dL (ref 70–99)
POTASSIUM: 4.6 meq/L (ref 3.7–5.3)
Sodium: 138 mEq/L (ref 137–147)

## 2014-06-04 MED ORDER — OXYCODONE-ACETAMINOPHEN 5-325 MG PO TABS
1.0000 | ORAL_TABLET | Freq: Once | ORAL | Status: AC
  Administered 2014-06-04: 1 via ORAL
  Filled 2014-06-04: qty 1

## 2014-06-04 MED ORDER — DIAZEPAM 5 MG/ML IJ SOLN
5.0000 mg | Freq: Once | INTRAMUSCULAR | Status: AC
  Administered 2014-06-04: 5 mg via INTRAMUSCULAR
  Filled 2014-06-04: qty 2

## 2014-06-04 MED ORDER — CLINDAMYCIN HCL 150 MG PO CAPS: 300.0000 mg | ORAL_CAPSULE | Freq: Three times a day (TID) | ORAL | Status: DC

## 2014-06-04 MED ORDER — CLINDAMYCIN HCL 150 MG PO CAPS
300.0000 mg | ORAL_CAPSULE | Freq: Once | ORAL | Status: AC
  Administered 2014-06-04: 300 mg via ORAL
  Filled 2014-06-04: qty 2

## 2014-06-04 MED ORDER — LORAZEPAM 1 MG PO TABS
1.0000 mg | ORAL_TABLET | Freq: Once | ORAL | Status: AC
  Administered 2014-06-04: 1 mg via ORAL
  Filled 2014-06-04: qty 2

## 2014-06-04 MED ORDER — OXYCODONE-ACETAMINOPHEN 5-325 MG PO TABS: 1.0000 | ORAL_TABLET | Freq: Four times a day (QID) | ORAL | Status: DC | PRN

## 2014-06-04 NOTE — ED Notes (Signed)
Declined W/C at D/C and was escorted to lobby by RN. 

## 2014-06-04 NOTE — ED Provider Notes (Signed)
CSN: [id number]     Arrival date & time 06/04/14  1625 History   First MD Initiated Contact with Patient 06/04/14 1655     This chart was scribed for non-physician practitioner, Francee PiccoloJennifer Primitivo Merkey PA-C working with Purvis SheffieldForrest Harrison, MD by Arlan OrganAshley Leger, ED Scribe. This patient was seen in room TR06C/TR06C and the patient's care was started at 5:22 PM.   Chief Complaint  Patient presents with  . Abscess   The history is provided by the patient. No language interpreter was used.    HPI Comments: Stacie Carrillo is a 33 y.o. female who presents to the Emergency Department complaining of an abscess to the R axilla x 3 days that has progressively worsened. She also reports chills and paresthesia to her R arm onset today. Pt recently used her daughters Secret deodorant instead of her regular Degree deodorant and has noted recent symptoms. She has tried warm compresses and OTC boil treatments without any improvement for symptoms. Pt reports a history of abscesses requiring I&D for treatment. Last abscess December 2014. However, last visit pt underwent surgery to properly drain previous abscess post infection. Pt with known allergy to Latex. No other concerns this visit.  Past Medical History  Diagnosis Date  . Depression   . Polycystic ovarian syndrome   . Obesity   . Gestational diabetes   . Reflux   . Anemia   . Anxiety   . Ulcer    Past Surgical History  Procedure Laterality Date  . Tubal ligation    . Cesarean section      x4  . Irrigation and debridement abscess Right 10/09/2013    Procedure: IRRIGATION AND DEBRIDEMENT RIGHT AXILLARY ABSCESS;  Surgeon: Kandis Cockingavid H Newman, MD;  Location: [facility];  Service: General;  Laterality: Right;   Family History  Problem Relation Age of Onset  . Throat cancer Maternal Aunt   . Cirrhosis Mother   . Diabetes Paternal Grandmother    History  Substance Use Topics  . Smoking status: Current Every Day Smoker -- 0.25 packs/day for 19 years     Types: Cigarettes  . Smokeless tobacco: Never Used  . Alcohol Use: No   OB History   Grav Para Term Preterm Abortions TAB SAB Ect Mult Living   4 4 4  0 0 0 0 0 0 4     Review of Systems  Constitutional: Positive for chills. Negative for fever.  Skin: Positive for wound (Abscess to the R axilla).  Neurological: Negative for weakness and numbness.  All other systems reviewed and are negative.     Allergies  Latex  Home Medications   Prior to Admission medications   Medication Sig Start Date End Date Taking? Authorizing Provider  ranitidine (ZANTAC) 150 MG tablet Take 150 mg by mouth daily as needed for heartburn.   Yes Historical Provider, MD  clindamycin (CLEOCIN) 150 MG capsule Take 2 capsules (300 mg total) by mouth 3 (three) times daily. May dispense as 150mg  capsules 06/04/14   Kristien Salatino L Jaymien Landin, PA-C  oxyCODONE-acetaminophen (PERCOCET/ROXICET) 5-325 MG per tablet Take 1-2 tablets by mouth every 6 (six) hours as needed for severe pain. May take 2 tablets PO q 6 hours for severe pain - Do not take with Tylenol as this tablet already contains tylenol 06/04/14   Lise AuerJennifer L Salif Tay, PA-C   Triage Vitals: BP 108/56  Pulse 93  Temp(Src) 98 F (36.7 C) (Oral)  Resp 16  Ht 5' 4.5" (1.638 m)  Wt 187 lb 4  oz (84.936 kg)  BMI 31.66 kg/m2  SpO2 99%  LMP 05/31/2014   Physical Exam  Nursing note and vitals reviewed. Constitutional: She is oriented to person, place, and time. She appears well-developed and well-nourished. No distress.  HENT:  Head: Normocephalic and atraumatic.  Right Ear: External ear normal.  Left Ear: External ear normal.  Nose: Nose normal.  Mouth/Throat: Oropharynx is clear and moist.  Eyes: Conjunctivae are normal.  Neck: Normal range of motion. Neck supple.  Cardiovascular: Normal rate, regular rhythm and normal heart sounds.   Pulmonary/Chest: Effort normal and breath sounds normal.  Abdominal: Soft.  Musculoskeletal: Normal range of motion.   Neurological: She is alert and oriented to person, place, and time.  Skin: Skin is warm and dry. She is not diaphoretic.     Psychiatric: She has a normal mood and affect.    ED Course  Procedures (including critical care time) Medications  LORazepam (ATIVAN) tablet 1 mg (1 mg Oral Given 06/04/14 1746)  oxyCODONE-acetaminophen (PERCOCET/ROXICET) 5-325 MG per tablet 1 tablet (1 tablet Oral Given 06/04/14 1746)  clindamycin (CLEOCIN) capsule 300 mg (300 mg Oral Given 06/04/14 1746)  diazepam (VALIUM) injection 5 mg (5 mg Intramuscular Given 06/04/14 1836)  oxyCODONE-acetaminophen (PERCOCET/ROXICET) 5-325 MG per tablet 1 tablet (1 tablet Oral Given 06/04/14 2000)     DIAGNOSTIC STUDIES: Oxygen Saturation is 99% on RA, Normal by my interpretation.    COORDINATION OF CARE: 8:46 PM- Will give PO Ativan and Percocet. Will perform I&D. Discussed treatment plan with pt at bedside and pt agreed to plan.     Labs Review Labs Reviewed  CBC WITH DIFFERENTIAL - Abnormal; Notable for the following:    WBC 10.9 (*)    All other components within normal limits  BASIC METABOLIC PANEL    Imaging Review No results found.   EKG Interpretation None      INCISION AND DRAINAGE Performed by: Jimmey Ralph PA-S Authorized by: Jeannetta Ellis Consent: Verbal consent obtained. Risks and benefits: risks, benefits and alternatives were discussed Type: abscess  Body area: right axilla  Anesthesia: local infiltration  Incision was made with a scalpel.  Local anesthetic: lidocaine 2% w/ epinephrine  Anesthetic total: 10 ml  Complexity: complex Blunt dissection to break up loculations  Drainage: purulent and bloody  Drainage amount: moderate  Packing material: 1/4 in iodoform gauze  Patient tolerance: Patient tolerated the procedure well with no immediate complications.    MDM   Final diagnoses:  Abscess of axilla, right    Filed Vitals:   06/04/14 1645  BP: 108/56   Pulse: 93  Temp: 98 F (36.7 C)  Resp: 16   Afebrile, NAD, non-toxic appearing, AAOx4.   I have reviewed nursing notes, vital signs, and all appropriate lab and imaging results for this patient.  Patient with skin abscess amenable to incision and drainage.  Abscess was packed,  wound recheck in 2 days. Encouraged home warm soaks and flushing.  Mild signs of cellulitis is surrounding skin.  Will d/c to home with antibiotics. Return precautions discussed. Patient is agreeable to plan. Patient is stable at time of discharge     I personally performed the services described in this documentation, which was scribed in my presence. The recorded information has been reviewed and is accurate.    Jeannetta Ellis, PA-C 06/04/14 2046

## 2014-06-04 NOTE — Discharge Instructions (Signed)
Please follow up with your primary care physician in 1-2 days. If you do not have one please call the [facility] number listed above. If you are unable to be seen by a primary care doctor in 2 days please return to the ER for a wound recheck. If you develop any fevers, warmth, redness to the area, or any other concerning signs or symptoms please return to the ED. Please read all discharge instructions and return precautions.    Abscess An abscess is an infected area that contains a collection of pus and debris.It can occur in almost any part of the body. An abscess is also known as a furuncle or boil. CAUSES  An abscess occurs when tissue gets infected. This can occur from blockage of oil or sweat glands, infection of hair follicles, or a minor injury to the skin. As the body tries to fight the infection, pus collects in the area and creates pressure under the skin. This pressure causes pain. People with weakened immune systems have difficulty fighting infections and get certain abscesses more often.  SYMPTOMS Usually an abscess develops on the skin and becomes a painful mass that is red, warm, and tender. If the abscess forms under the skin, you may feel a moveable soft area under the skin. Some abscesses break open (rupture) on their own, but most will continue to get worse without care. The infection can spread deeper into the body and eventually into the bloodstream, causing you to feel ill.  DIAGNOSIS  Your caregiver will take your medical history and perform a physical exam. A sample of fluid may also be taken from the abscess to determine what is causing your infection. TREATMENT  Your caregiver may prescribe antibiotic medicines to fight the infection. However, taking antibiotics alone usually does not cure an abscess. Your caregiver may need to make a small cut (incision) in the abscess to drain the pus. In some cases, gauze is packed into the abscess to reduce pain and to  continue draining the area. HOME CARE INSTRUCTIONS   Only take over-the-counter or prescription medicines for pain, discomfort, or fever as directed by your caregiver.  If you were prescribed antibiotics, take them as directed. Finish them even if you start to feel better.  If gauze is used, follow your caregiver's directions for changing the gauze.  To avoid spreading the infection:  Keep your draining abscess covered with a bandage.  Wash your hands well.  Do not share personal care items, towels, or whirlpools with others.  Avoid skin contact with others.  Keep your skin and clothes clean around the abscess.  Keep all follow-up appointments as directed by your caregiver. SEEK MEDICAL CARE IF:   You have increased pain, swelling, redness, fluid drainage, or bleeding.  You have muscle aches, chills, or a general ill feeling.  You have a fever. MAKE SURE YOU:   Understand these instructions.  Will watch your condition.  Will get help right away if you are not doing well or get worse. Document Released: 07/15/2005 Document Revised: 04/05/2012 Document Reviewed: 12/18/2011 [facility] Patient Information 2015 [employer] [place]. This information is not intended to replace advice given to you by your health care provider. Make sure you discuss any questions you have with your health care provider.

## 2014-06-04 NOTE — ED Notes (Signed)
Reports having an abscess under right arm x 3 days.

## 2014-06-06 ENCOUNTER — Emergency Department (HOSPITAL_COMMUNITY)
Admission: EM | Admit: 2014-06-06 | Discharge: 2014-06-06 | Disposition: A | Discharge status: Home / Self Care | Payer: No Typology Code available for payment source | Attending: Emergency Medicine | Admitting: Emergency Medicine

## 2014-06-06 ENCOUNTER — Encounter (HOSPITAL_COMMUNITY): Payer: Self-pay | Admitting: Emergency Medicine

## 2014-06-06 DIAGNOSIS — Z8632 Personal history of gestational diabetes: Secondary | ICD-10-CM | POA: Insufficient documentation

## 2014-06-06 DIAGNOSIS — Z792 Long term (current) use of antibiotics: Secondary | ICD-10-CM | POA: Insufficient documentation

## 2014-06-06 DIAGNOSIS — E669 Obesity, unspecified: Secondary | ICD-10-CM | POA: Insufficient documentation

## 2014-06-06 DIAGNOSIS — K219 Gastro-esophageal reflux disease without esophagitis: Secondary | ICD-10-CM | POA: Insufficient documentation

## 2014-06-06 DIAGNOSIS — Z8659 Personal history of other mental and behavioral disorders: Secondary | ICD-10-CM | POA: Insufficient documentation

## 2014-06-06 DIAGNOSIS — Z5189 Encounter for other specified aftercare: Secondary | ICD-10-CM

## 2014-06-06 DIAGNOSIS — F17200 Nicotine dependence, unspecified, uncomplicated: Secondary | ICD-10-CM | POA: Insufficient documentation

## 2014-06-06 DIAGNOSIS — Z4801 Encounter for change or removal of surgical wound dressing: Secondary | ICD-10-CM | POA: Insufficient documentation

## 2014-06-06 DIAGNOSIS — Z872 Personal history of diseases of the skin and subcutaneous tissue: Secondary | ICD-10-CM | POA: Insufficient documentation

## 2014-06-06 DIAGNOSIS — Z862 Personal history of diseases of the blood and blood-forming organs and certain disorders involving the immune mechanism: Secondary | ICD-10-CM | POA: Insufficient documentation

## 2014-06-06 DIAGNOSIS — Z91040 Latex allergy status: Secondary | ICD-10-CM | POA: Insufficient documentation

## 2014-06-06 NOTE — Discharge Instructions (Signed)
Please follow up with your primary care physician, nursing care center or back to the emergency department in 2 days for a wound recheck. Please continue to take her antibiotic as prescribed. Please continue to use the narcotic as prescribed. Please read all discharge instructions and return precautions.   Abscess Care After An abscess (also called a boil or furuncle) is an infected area that contains a collection of pus. Signs and symptoms of an abscess include pain, tenderness, redness, or hardness, or you may feel a moveable soft area under your skin. An abscess can occur anywhere in the body. The infection may spread to surrounding tissues causing cellulitis. A cut (incision) by the surgeon was made over your abscess and the pus was drained out. Gauze may have been packed into the space to provide a drain that will allow the cavity to heal from the inside outwards. The boil may be painful for 5 to 7 days. Most people with a boil do not have high fevers. Your abscess, if seen early, may not have localized, and may not have been lanced. If not, another appointment may be required for this if it does not get better on its own or with medications. HOME CARE INSTRUCTIONS   Only take over-the-counter or prescription medicines for pain, discomfort, or fever as directed by your caregiver.  When you bathe, soak and then remove gauze or iodoform packs at least daily or as directed by your caregiver. You may then wash the wound gently with mild soapy water. Repack with gauze or do as your caregiver directs. SEEK IMMEDIATE MEDICAL CARE IF:   You develop increased pain, swelling, redness, drainage, or bleeding in the wound site.  You develop signs of generalized infection including muscle aches, chills, fever, or a general ill feeling.  An oral temperature above 102 F (38.9 C) develops, not controlled by medication. See your caregiver for a recheck if you develop any of the symptoms described above. If  medications (antibiotics) were prescribed, take them as directed. Document Released: 04/23/2005 Document Revised: 12/28/2011 Document Reviewed: 12/19/2007 [facility] Patient Information 2015 [employer] [place]. This information is not intended to replace advice given to you by your health care provider. Make sure you discuss any questions you have with your health care provider.

## 2014-06-06 NOTE — ED Provider Notes (Signed)
CSN: [id number]     Arrival date & time 06/06/14  1937 History   None    Chief Complaint  Patient presents with  . Wound Check     (Consider location/radiation/quality/duration/timing/severity/associated sxs/prior Treatment) HPI Comments: Patient is a 33 yo F PMHx significant for depression, PCOS, Anemia, Anxiety presenting back to the emergency department 2 days later for a wound recheck after incision and drainage of abscess in her right axilla. Patient states the pain has improved, the area has continued to have copious purulent drainage. Denies any fever or worsening redness to the axilla. Has been taking her antibiotic as prescribed. Pain has been well-controlled at home. Patient has enough pain medicine at home still. Denies any fevers or chills.  Patient is a 33 y.o. female presenting with wound check.  Wound Check    Past Medical History  Diagnosis Date  . Depression   . Polycystic ovarian syndrome   . Obesity   . Gestational diabetes   . Reflux   . Anemia   . Anxiety   . Ulcer    Past Surgical History  Procedure Laterality Date  . Tubal ligation    . Cesarean section      x4  . Irrigation and debridement abscess Right 10/09/2013    Procedure: IRRIGATION AND DEBRIDEMENT RIGHT AXILLARY ABSCESS;  Surgeon: Kandis Cockingavid H Newman, MD;  Location: [facility];  Service: General;  Laterality: Right;   Family History  Problem Relation Age of Onset  . Throat cancer Maternal Aunt   . Cirrhosis Mother   . Diabetes Paternal Grandmother    History  Substance Use Topics  . Smoking status: Current Every Day Smoker -- 0.25 packs/day for 19 years    Types: Cigarettes  . Smokeless tobacco: Never Used  . Alcohol Use: No   OB History   Grav Para Term Preterm Abortions TAB SAB Ect Mult Living   4 4 4  0 0 0 0 0 0 4     Review of Systems  Skin: Positive for wound.  All other systems reviewed and are negative.     Allergies  Latex  Home Medications   Prior to Admission  medications   Medication Sig Start Date End Date Taking? Authorizing Provider  clindamycin (CLEOCIN) 150 MG capsule Take 2 capsules (300 mg total) by mouth 3 (three) times daily. May dispense as 150mg  capsules 06/04/14   Peggi Yono L Hallie Ertl, PA-C  oxyCODONE-acetaminophen (PERCOCET/ROXICET) 5-325 MG per tablet Take 1-2 tablets by mouth every 6 (six) hours as needed for severe pain. May take 2 tablets PO q 6 hours for severe pain - Do not take with Tylenol as this tablet already contains tylenol 06/04/14   Rovena Hearld L Sheneka Schrom, PA-C  ranitidine (ZANTAC) 150 MG tablet Take 150 mg by mouth daily as needed for heartburn.    Historical Provider, MD   BP 117/74  Pulse 101  Temp(Src) 98.4 F (36.9 C) (Oral)  Resp 18  Ht 5\' 4"  (1.626 m)  Wt 185 lb (83.915 kg)  BMI 31.74 kg/m2  SpO2 98%  LMP 05/31/2014 Physical Exam  Nursing note and vitals reviewed. Constitutional: She is oriented to person, place, and time. She appears well-developed and well-nourished. No distress.  HENT:  Head: Normocephalic and atraumatic.  Right Ear: External ear normal.  Left Ear: External ear normal.  Nose: Nose normal.  Mouth/Throat: Oropharynx is clear and moist.  Eyes: Conjunctivae are normal.  Neck: Normal range of motion. Neck supple.  Cardiovascular: Normal rate, regular rhythm and  normal heart sounds.   Pulmonary/Chest: Effort normal and breath sounds normal.  Abdominal: Soft.  Musculoskeletal: Normal range of motion.  Neurological: She is alert and oriented to person, place, and time.  Skin: Skin is warm and dry. She is not diaphoretic.  Incision with packing in place. No surrounding erythema. No fluctuance or induration noted. Dried purulent drainage noted. Mildly TTP.   Psychiatric: She has a normal mood and affect.    ED Course  Procedures (including critical care time) Medications - No data to display  Labs Review Labs Reviewed - No data to display  Imaging Review No results found.   EKG  Interpretation None      MDM   Final diagnoses:  Encounter for wound care    Filed Vitals:   06/06/14 1947  BP: 117/74  Pulse: 101  Temp: 98.4 F (36.9 C)  Resp: 18   Afebrile, NAD, non-toxic appearing, AAOx4. Patient for wound recheck two days after having I&D. No surrounding erythema. Mild purulent drainage noted improved from 2 days ago. No further fluctuance or induration. Re- packed the wound without further incision and drainage. Advise follow up in 2 days for recheck. Return precautions discussed. Patient is stable at time of discharge      Jeannetta Ellis, PA-C 06/07/14 1610

## 2014-06-06 NOTE — ED Provider Notes (Signed)
Medical screening examination/treatment/procedure(s) were performed by non-physician practitioner and as supervising physician I was immediately available for consultation/collaboration.   EKG Interpretation None        Purvis SheffieldForrest Marilouise Densmore, MD 06/06/14 1120

## 2014-06-06 NOTE — ED Notes (Signed)
Wound check for a boil under boil rt. Arm pit. Here this past Monday for treatment

## 2014-06-06 NOTE — ED Notes (Signed)
Declined W/C at D/C and was escorted to lobby by RN. 

## 2014-06-07 NOTE — ED Provider Notes (Signed)
Medical screening examination/treatment/procedure(s) were performed by non-physician practitioner and as supervising physician I was immediately available for consultation/collaboration.   Yoltzin Ransom T Fortune Brannigan, MD 06/07/14 2251 

## 2014-08-20 ENCOUNTER — Encounter (HOSPITAL_COMMUNITY): Payer: Self-pay | Admitting: Emergency Medicine

## 2014-09-01 ENCOUNTER — Encounter (HOSPITAL_COMMUNITY): Payer: Self-pay | Admitting: Emergency Medicine

## 2014-09-01 ENCOUNTER — Emergency Department (HOSPITAL_COMMUNITY)
Admission: EM | Admit: 2014-09-01 | Discharge: 2014-09-01 | Disposition: A | Discharge status: Home / Self Care | Payer: No Typology Code available for payment source | Attending: Emergency Medicine | Admitting: Emergency Medicine

## 2014-09-01 DIAGNOSIS — R519 Headache, unspecified: Secondary | ICD-10-CM

## 2014-09-01 DIAGNOSIS — Z8719 Personal history of other diseases of the digestive system: Secondary | ICD-10-CM | POA: Insufficient documentation

## 2014-09-01 DIAGNOSIS — Z720 Tobacco use: Secondary | ICD-10-CM | POA: Insufficient documentation

## 2014-09-01 DIAGNOSIS — Z8659 Personal history of other mental and behavioral disorders: Secondary | ICD-10-CM | POA: Insufficient documentation

## 2014-09-01 DIAGNOSIS — R51 Headache: Secondary | ICD-10-CM | POA: Insufficient documentation

## 2014-09-01 DIAGNOSIS — Z91040 Latex allergy status: Secondary | ICD-10-CM | POA: Insufficient documentation

## 2014-09-01 DIAGNOSIS — E669 Obesity, unspecified: Secondary | ICD-10-CM | POA: Insufficient documentation

## 2014-09-01 DIAGNOSIS — Z862 Personal history of diseases of the blood and blood-forming organs and certain disorders involving the immune mechanism: Secondary | ICD-10-CM | POA: Insufficient documentation

## 2014-09-01 DIAGNOSIS — Z8632 Personal history of gestational diabetes: Secondary | ICD-10-CM | POA: Insufficient documentation

## 2014-09-01 DIAGNOSIS — R110 Nausea: Secondary | ICD-10-CM | POA: Insufficient documentation

## 2014-09-01 DIAGNOSIS — Z3202 Encounter for pregnancy test, result negative: Secondary | ICD-10-CM | POA: Insufficient documentation

## 2014-09-01 DIAGNOSIS — H53149 Visual discomfort, unspecified: Secondary | ICD-10-CM | POA: Insufficient documentation

## 2014-09-01 DIAGNOSIS — Z792 Long term (current) use of antibiotics: Secondary | ICD-10-CM | POA: Insufficient documentation

## 2014-09-01 LAB — URINALYSIS, ROUTINE W REFLEX MICROSCOPIC
Bilirubin Urine: NEGATIVE
GLUCOSE, UA: NEGATIVE mg/dL
KETONES UR: NEGATIVE mg/dL
Leukocytes, UA: NEGATIVE
NITRITE: NEGATIVE
PH: 6.5 (ref 5.0–8.0)
PROTEIN: NEGATIVE mg/dL
Specific Gravity, Urine: 1.023 (ref 1.005–1.030)
Urobilinogen, UA: 1 mg/dL (ref 0.0–1.0)

## 2014-09-01 LAB — URINE MICROSCOPIC-ADD ON

## 2014-09-01 LAB — PREGNANCY, URINE: Preg Test, Ur: NEGATIVE

## 2014-09-01 MED ORDER — KETOROLAC TROMETHAMINE 60 MG/2ML IM SOLN
60.0000 mg | Freq: Once | INTRAMUSCULAR | Status: AC
  Administered 2014-09-01: 60 mg via INTRAMUSCULAR
  Filled 2014-09-01: qty 2

## 2014-09-01 MED ORDER — METOCLOPRAMIDE HCL 10 MG PO TABS: 10.0000 mg | ORAL_TABLET | Freq: Three times a day (TID) | ORAL | Status: DC | PRN

## 2014-09-01 MED ORDER — DIPHENHYDRAMINE HCL 25 MG PO CAPS
25.0000 mg | ORAL_CAPSULE | Freq: Once | ORAL | Status: AC
  Administered 2014-09-01: 25 mg via ORAL
  Filled 2014-09-01: qty 1

## 2014-09-01 MED ORDER — METOCLOPRAMIDE HCL 10 MG PO TABS
10.0000 mg | ORAL_TABLET | Freq: Once | ORAL | Status: AC
  Administered 2014-09-01: 10 mg via ORAL
  Filled 2014-09-01: qty 1

## 2014-09-01 NOTE — ED Provider Notes (Signed)
CSN: [id number]     Arrival date & time 09/01/14  [id number] History   First MD Initiated Contact with Patient 09/01/14 0935     Chief Complaint  Patient presents with  . Migraine     (Consider location/radiation/quality/duration/timing/severity/associated sxs/prior Treatment) HPI Stacie Carrillo is a 33 year old female with past medical history of obesity, depression, PCOS, migraines who presents the ER with headache since last night. Patient states her headache began gradually last night, and she was able to go to sleep, however when she woke up this morning her headache had worsened. She states she attempted to use ibuprofen to help her headache, however vomited after taking the ibuprofen. Patient states her vomit was nonbloody, nonbilious. Patient describes her headache as right, frontal headache which is throbbing in nature. Patient reports associated photophobia, phonophobia. Patient denies numbness, weakness, blurred vision, new features of headache, fever, neck pain, neck stiffness, nausea, vomiting, chest pain, shortness of breath, abdominal pain, dysuria. Patient states her headache today feels identical to the migraines she has had in the past.  Past Medical History  Diagnosis Date  . Depression   . Polycystic ovarian syndrome   . Obesity   . Gestational diabetes   . Reflux   . Anemia   . Anxiety   . Ulcer    Past Surgical History  Procedure Laterality Date  . Tubal ligation    . Cesarean section      x4  . Irrigation and debridement abscess Right 10/09/2013    Procedure: IRRIGATION AND DEBRIDEMENT RIGHT AXILLARY ABSCESS;  Surgeon: Kandis Cockingavid H Newman, MD;  Location: [facility];  Service: General;  Laterality: Right;   Family History  Problem Relation Age of Onset  . Throat cancer Maternal Aunt   . Cirrhosis Mother   . Diabetes Paternal Grandmother    History  Substance Use Topics  . Smoking status: Current Every Day Smoker -- 0.25 packs/day for 19 years    Types: Cigarettes  .  Smokeless tobacco: Never Used  . Alcohol Use: No   OB History    Gravida Para Term Preterm AB TAB SAB Ectopic Multiple Living   4 4 4  0 0 0 0 0 0 4     Review of Systems  Constitutional: Negative for fever.  HENT: Negative for trouble swallowing.        Phonophobia  Eyes: Positive for photophobia. Negative for visual disturbance.  Respiratory: Negative for shortness of breath.   Cardiovascular: Negative for chest pain.  Gastrointestinal: Positive for nausea. Negative for vomiting and abdominal pain.  Genitourinary: Negative for dysuria.  Musculoskeletal: Negative for neck pain and neck stiffness.  Skin: Negative for rash.  Neurological: Positive for headaches. Negative for dizziness, weakness and numbness.  Psychiatric/Behavioral: Negative.       Allergies  Latex  Home Medications   Prior to Admission medications   Medication Sig Start Date End Date Taking? Authorizing Provider  ibuprofen (ADVIL,MOTRIN) 200 MG tablet Take 400 mg by mouth every 6 (six) hours as needed for mild pain or moderate pain.   Yes Historical Provider, MD  clindamycin (CLEOCIN) 150 MG capsule Take 2 capsules (300 mg total) by mouth 3 (three) times daily. May dispense as 150mg  capsules Patient not taking: Reported on 09/01/2014 06/04/14   Victorino DikeJennifer L Piepenbrink, PA-C  metoCLOPramide (REGLAN) 10 MG tablet Take 1 tablet (10 mg total) by mouth every 8 (eight) hours as needed for nausea (nausea/headache). 09/01/14   Monte FantasiaJoseph W Naiomi Musto, PA-C  oxyCODONE-acetaminophen (PERCOCET/ROXICET) 5-325 MG per tablet  Take 1-2 tablets by mouth every 6 (six) hours as needed for severe pain. May take 2 tablets PO q 6 hours for severe pain - Do not take with Tylenol as this tablet already contains tylenol Patient not taking: Reported on 09/01/2014 06/04/14   Victorino Dike L Piepenbrink, PA-C   BP 119/86 mmHg  Pulse 69  Temp(Src) 97.9 F (36.6 C) (Oral)  Resp 16  SpO2 100% Physical Exam  Constitutional: She appears well-developed  and well-nourished. No distress.  HENT:  Head: Normocephalic and atraumatic.  Mouth/Throat: Oropharynx is clear and moist. No oropharyngeal exudate.  Eyes: Conjunctivae and EOM are normal. Pupils are equal, round, and reactive to light. Right eye exhibits no discharge. Left eye exhibits no discharge. No scleral icterus. Right eye exhibits no nystagmus. Left eye exhibits no nystagmus.  Neck: Normal range of motion.  Cardiovascular: Normal rate, regular rhythm and normal heart sounds.   No murmur heard. Pulmonary/Chest: Effort normal and breath sounds normal. No respiratory distress.  Abdominal: Soft. There is no tenderness.  Musculoskeletal: Normal range of motion. She exhibits no edema or tenderness.  Neurological: She is alert. She has normal strength. No cranial nerve deficit or sensory deficit. Coordination normal. GCS eye subscore is 4. GCS verbal subscore is 5. GCS motor subscore is 6.  Patient fully alert answering questions appropriately in full, clear sentences. Cranial nerves II through XII grossly intact. Motor strength 5 out of 5 in all major muscle groups of upper and lower extremities. Distal sensation intact.  Skin: Skin is warm and dry. No rash noted. She is not diaphoretic.  Psychiatric: She has a normal mood and affect.  Nursing note and vitals reviewed.   ED Course  Procedures (including critical care time) Labs Review Labs Reviewed  URINALYSIS, ROUTINE W REFLEX MICROSCOPIC - Abnormal; Notable for the following:    APPearance CLOUDY (*)    Hgb urine dipstick MODERATE (*)    All other components within normal limits  URINE MICROSCOPIC-ADD ON - Abnormal; Notable for the following:    Squamous Epithelial / LPF MANY (*)    All other components within normal limits  PREGNANCY, URINE    Imaging Review No results found.   EKG Interpretation None      MDM   Final diagnoses:  Headache, unspecified headache type     12:00 PM: Patient stating her headache has  improved and she is stating she is "much better than when she came in here". Patient still reporting some mild photophobia, however stating her headache has decreased significantly. Pt HA treated and improved while in ED.  Presentation is like pts typical HA and non concerning for [facility], ICH, Meningitis, or temporal arteritis. Pt is afebrile with no focal neuro deficits, nuchal rigidity, or change in vision. Pt is to follow up with PCP to discuss prophylactic medication. We'll discharge patient at this time and have her follow-up with a primary care physician regarding these recurrent headaches. Patient states she does not have a PCP at this time, and I will provide patient with a resource guide to help her find one. I discussed return precautions with patient and encourage her to call or return to ER should she have any change or worsening of symptoms or should she have any questions or concerns.  BP 119/86 mmHg  Pulse 69  Temp(Src) 97.9 F (36.6 C) (Oral)  Resp 16  SpO2 100%  Signed,  Ladona Mow, PA-C 3:32 PM      Monte Fantasia, PA-C 09/01/14 [phone]  Juliet RudeNathan R. Rubin PayorPickering, MD 09/03/14 1357

## 2014-09-01 NOTE — ED Notes (Signed)
Pt c/o migraine w/ vomiting since last night.  Hx of same.

## 2014-09-01 NOTE — Discharge Instructions (Signed)
Migraine Headache °A migraine headache is an intense, throbbing pain on one or both sides of your head. A migraine can last for 30 minutes to several hours. °CAUSES  °The exact cause of a migraine headache is not always known. However, a migraine may be caused when nerves in the brain become irritated and release chemicals that cause inflammation. This causes pain. °Certain things may also trigger migraines, such as: °· Alcohol. °· Smoking. °· Stress. °· Menstruation. °· Aged cheeses. °· Foods or drinks that contain nitrates, glutamate, aspartame, or tyramine. °· Lack of sleep. °· Chocolate. °· Caffeine. °· Hunger. °· Physical exertion. °· Fatigue. °· Medicines used to treat chest pain (nitroglycerine), birth control pills, estrogen, and some blood pressure medicines. °SIGNS AND SYMPTOMS °· Pain on one or both sides of your head. °· Pulsating or throbbing pain. °· Severe pain that prevents daily activities. °· Pain that is aggravated by any physical activity. °· Nausea, vomiting, or both. °· Dizziness. °· Pain with exposure to bright lights, loud noises, or activity. °· General sensitivity to bright lights, loud noises, or smells. °Before you get a migraine, you may get warning signs that a migraine is coming (aura). An aura may include: °· Seeing flashing lights. °· Seeing bright spots, halos, or zigzag lines. °· Having tunnel vision or blurred vision. °· Having feelings of numbness or tingling. °· Having trouble talking. °· Having muscle weakness. °DIAGNOSIS  °A migraine headache is often diagnosed based on: °· Symptoms. °· Physical exam. °· A CT scan or MRI of your head. These imaging tests cannot diagnose migraines, but they can help rule out other causes of headaches. °TREATMENT °Medicines may be given for pain and nausea. Medicines can also be given to help prevent recurrent migraines.  °HOME CARE INSTRUCTIONS °· Only take over-the-counter or prescription medicines for pain or discomfort as directed by your  health care provider. The use of long-term narcotics is not recommended. °· Lie down in a dark, quiet room when you have a migraine. °· Keep a journal to find out what may trigger your migraine headaches. For example, write down: °¨ What you eat and drink. °¨ How much sleep you get. °¨ Any change to your diet or medicines. °· Limit alcohol consumption. °· Quit smoking if you smoke. °· Get 7-9 hours of sleep, or as recommended by your health care provider. °· Limit stress. °· Keep lights dim if bright lights bother you and make your migraines worse. °SEEK IMMEDIATE MEDICAL CARE IF:  °· Your migraine becomes severe. °· You have a fever. °· You have a stiff neck. °· You have vision loss. °· You have muscular weakness or loss of muscle control. °· You start losing your balance or have trouble walking. °· You feel faint or pass out. °· You have severe symptoms that are different from your first symptoms. °MAKE SURE YOU:  °· Understand these instructions. °· Will watch your condition. °· Will get help right away if you are not doing well or get worse. °Document Released: 10/05/2005 Document Revised: 02/19/2014 Document Reviewed: 06/12/2013 °Exit[employer]® Patient Information ©2015 [employer]. This information is not intended to replace advice given to you by your health care provider. Make sure you discuss any questions you have with your health care provider. ° ° °Emergency Department Resource Guide °1) Find a Doctor and Pay Out of Pocket °Although you won't have to find out who is covered by your insurance plan, it is a good idea to ask around and get recommendations.   You will then need to call the office and see if the doctor you have chosen will accept you as a new patient and what types of options they offer for patients who are self-pay. Some doctors offer discounts or will set up payment plans for their patients who do not have insurance, but you will need to ask so you aren't surprised when you get to your  appointment. ° °2) Contact Your Local Health Department °Not all health departments have doctors that can see patients for sick visits, but many do, so it is worth a call to see if yours does. If you don't know where your local health department is, you can check in your phone book. The CDC also has a tool to help you locate your state's health department, and many state websites also have listings of all of their local health departments. ° °3) Find a Walk-in Clinic °If your illness is not likely to be very severe or complicated, you may want to try a walk in clinic. These are popping up all over the country in pharmacies, drugstores, and shopping centers. They're usually staffed by nurse practitioners or physician assistants that have been trained to treat common illnesses and complaints. They're usually fairly quick and inexpensive. However, if you have serious medical issues or chronic medical problems, these are probably not your best option. ° °No Primary Care Doctor: °- Call [facility] at  832-8000 - they can help you locate a primary care doctor that  accepts your insurance, provides certain services, etc. °- Physician Referral Service- 1-800-533-3463 ° °Chronic Pain Problems: °Organization         Address  Phone   Notes  °[facility]  (336) 297-2271 Patients need to be referred by their primary care doctor.  ° °Medication Assistance: °Organization         Address  Phone   Notes  °[place] [facility] [address] °[place], [place] [address] (336) 641-8030 --Must be a resident of [place] °-- Must have NO insurance coverage whatsoever (no Medicaid/ Medicare, etc.) °-- The pt. MUST have a primary care doctor that directs their care regularly and follows them in the community °  °[facility]  (866) 331-1348   °[employer]  (888) 892-1162   ° °Agencies that provide inexpensive medical care: °Organization         Address  Phone   Notes  °[facility]  (336) 832-8035   °[facility]    (336) 832-7272   °[facility] [address] °[place], [place] [address] (336) 832-4777   °[facility] [address], °[place] (336) 271-4999   °[facility]    (336) 373-0678   °[facility]    (336) 272-1050   °[facility] ° [address], [place] Phone:  (336) 832-4444, Fax:  (336) 832-4440 Hours of Operation:  9 am - 6 pm, M-F.  Also accepts Medicaid/Medicare and self-pay.  °[facility] ° [address],  Phone: (336) 832-3150, Fax: (336) 832-3151. Hours of Operation:  8:30 am - 5:30 pm, M-F.  Also accepts Medicaid and self-pay.  °[facility] [place] [address], [place] Phone: (336) 878-6027   °[facility] [address], [place],  (336)723-1848, Ext. 123 Mondays & Thursdays: 7-9 AM.  First 15 patients are seen on a first come,   first serve basis. °  ° °Medicaid-accepting [place] Providers: ° °Organization         Address  Phone   Notes  °[facility] [address], [place] (336) 641-2100 Also accepts self-pay patients.  °[facility] [address], [place] ° (336) 856-9996   °[facility] [address], [place] (336) 288-8857   °[facility] [address] [place] [address], [place] (336) 299-7000   °Veita Bland [address], [place]  ° (336) 373-1557 Only accepts [place] Access Medicaid patients after they have their name applied to their card.  ° °Self-Pay (no insurance) in [place]: ° °Organization         Address  Phone   Notes  °Sickle Cell Patients, [facility] [address], [place] (336) 832-1970   °[facility] [address], [place] (336) 832-4400   ° [facility] ° [address], [place] (336) 992-4800   °[facility]/Dr. Osei-Bonsu ° [address] [place] [address], [place] or [address], [place] (336) [phone] Phone number for both [place] and [place] locations is the same.  °[facility] [address], [place] (336) 299-0000   °[facility] [address] [place] [address], [place] or [address] (336) 852-7530 °(336) 878-2260   °[facility] [address], [place] (336) 350-1642, phone; (336) 294-5005, fax Sees patients 1st and 3rd Saturday of every month.  Must not qualify for public or private insurance (i.e. Medicaid, Medicare, [place] Health Choice, Veterans' Benefits) • Household income should be no more than 200% of the poverty level •The clinic cannot treat you if you are pregnant or think you are pregnant • Sexually transmitted diseases are not treated at the clinic.  ° ° °Dental Care: °Organization         Address  Phone  Notes  °[facility] [address], [place] (336) 641-6152 Accepts children up to age 21 who are enrolled in Medicaid or [place] Health Choice; pregnant women with a Medicaid card; and children who have applied for Medicaid or [place] Health Choice, but were declined, whose parents can pay a reduced fee at time of service.  °[facility] [place]  [address], [place] (336) 641-7733 Accepts children up to age 21 who are enrolled in Medicaid or [place] Health Choice; pregnant women with a Medicaid card; and children who have applied for Medicaid or  Health Choice, but were declined, whose parents can pay a reduced fee at time of service.  °[facility] ° [address], [place] (336) 641-4533 Patients are seen by appointment only. Walk-ins are not accepted. [facility] will see patients 18 years of age and  older. °Monday - Tuesday (8am-5pm) °Most Wednesdays (8:30-5pm) °$30 per visit, cash only  °[facility] ° [address], [place] (336) 641-4533 Patients are seen by appointment only. Walk-ins are not accepted. [facility] will see patients 18 years of age and older. °One Wednesday Evening (Monthly: Volunteer Based).  $30 per visit, cash only  °[facility]  (919) 537-3737 for adults; Children under age 4, call Graduate Pediatric Dentistry at ([phone]) [phone]. Children aged 4-14, please   call (919) [phone] to request a pediatric application. ° Dental services are provided in all areas of dental care including fillings, crowns and bridges, complete and partial dentures, implants, gum treatment, root canals, and extractions. Preventive care is also provided. Treatment is provided to both adults and children. °Patients are selected via a lottery and there is often a waiting list. °  °[facility] [address], °[place] ° (336) 763-8833 [email or web address] °  °[facility] [address], [place], [place] (336)723-1848, Ext. 123 Second and Fourth Thursday of each month, opens at 6:30 AM; Clinic ends at 9 AM.  Patients are seen on a first-come first-served basis, and a limited number are seen during each clinic.  ° °[facility] ° [address], [place], [place] (336) 723-7904   Eligibility Requirements °You must have lived in [place], [place], or [place] counties for at least the last three months. °  You cannot be eligible for state or federal sponsored healthcare insurance, including Veterans Administration, Medicaid, or Medicare. °  You generally cannot be eligible for healthcare insurance through your employer.  °  How to apply: °Eligibility screenings are held every Tuesday and Wednesday afternoon from 1:00 pm until 4:00 pm. You do not need an appointment for the interview!  °[facility] [address],  [place], [place] 336-631-2330   °[facility]  336-342-8273   °[facility]  336-703-3100   °[facility]  336-570-6415   ° °Behavioral Health Resources in the Community: °Intensive Outpatient Programs °Organization         Address  Phone  Notes  °[facility] [address], [place], [place] 336-878-6098   °[facility] [address], [place], [place] 336-832-9800   °[facility] [address], [place], [place] ° 336-882-2125   °[facility] [address],  °[place], [place] 1-800-853-5163 or 336-641-4981   °Substance Abuse Resources °Organization         Address  Phone  Notes  °[facility]  336-882-2125   °[facility]  336-784-9470   °[facility]  336-285-9073   °[facility]  336-845-3988   °Residential & Outpatient Substance Abuse Program  1-800-659-3381   °Psychological Services °Organization         Address  Phone  Notes  ° Health  336- 832-9600   °[facility]  336- 378-7881   °[facility] [address], [place] 1-800-853-5163 or 336-641-4981   ° °Mobile Crisis Teams °Organization         Address  Phone  Notes  °[facility], Mobile Crisis Care Unit  1-877-626-1772   °Assertive °[facility] ° [address] [place], [place] 336-834-9664   °Sharon DeEsch [address] °[place] 336-554-5454   ° °Self-Help/Support Groups °Organization         Address  Phone             Notes  °[facility] - variety of support groups  336- 373-1402 Call for more information  °Narcotics Anonymous (NA), [facility] [address], °[place]  2 meetings at this location  ° °Residential Treatment Programs °Organization         Address  Phone  Notes  °[facility] [address],    °[place]  1-866-801-8205   °[facility] ° [address], [place], [place] 704-293-8524   °[facility] [address], [place] 336-845-3988 Admissions: 8am-3pm M-F  °[facility] [address]    °[place], [place] 336-841-1104   °[facility] [address], [place], [place] 336-379-7146   °[facility] [address]  °[place], [place] 336-285-9073   °[facility] - Intensive Outpatient [address], [place], [place] 336-852-3033   °[facility] [address]  °[place], [place] 1-877-615-2722 or 336-784-9470   °[facility] [address] [place], [place] 336-227-7417 Accepts Medicaid  °[facility] [address]  °[place] 1-800-659-3381 Substance Abuse/Addiction Treatment  ° °[place] Behavioral Health Resources °Organization         Address  Phone  Notes  °[facility]  (888) 581-9988   °Julie Brannon, PhD [address] [place], [place]   (336) 349-5553 or (336) 951-0000   °[facility]   [address] °[place], [place] (336) 349-4454   °[facility] [address], [place], [place] (336) 342-8316 Insurance/Medicaid/sponsorship through [facility]  °[facility] [address]                                    [place], [place] (336) 342-8316 Therapy/tele-psych/case  °[facility] [address]  ° [place], [place] (336) 349-2233    °Dr. Arfeen  (336) 349-4544   °[facility] [place]  [employer] [place] [facility] 1) [address], [place] °2) [address], [place] °3)  [address], [place] (336) 349-3220 °(336) 342-7768 ° °(336) 342-8140   °[place] [facility] (336) 342-1394 or (336) 342-3537 (After Hours)    ° ° ° °

## 2014-10-21 ENCOUNTER — Emergency Department (HOSPITAL_COMMUNITY)
Admission: EM | Admit: 2014-10-21 | Discharge: 2014-10-21 | Disposition: A | Discharge status: Home / Self Care | Payer: No Typology Code available for payment source | Attending: Emergency Medicine | Admitting: Emergency Medicine

## 2014-10-21 ENCOUNTER — Encounter (HOSPITAL_COMMUNITY): Payer: Self-pay | Admitting: *Deleted

## 2014-10-21 DIAGNOSIS — Z862 Personal history of diseases of the blood and blood-forming organs and certain disorders involving the immune mechanism: Secondary | ICD-10-CM | POA: Insufficient documentation

## 2014-10-21 DIAGNOSIS — Z720 Tobacco use: Secondary | ICD-10-CM | POA: Insufficient documentation

## 2014-10-21 DIAGNOSIS — Z91040 Latex allergy status: Secondary | ICD-10-CM | POA: Insufficient documentation

## 2014-10-21 DIAGNOSIS — L0390 Cellulitis, unspecified: Secondary | ICD-10-CM

## 2014-10-21 DIAGNOSIS — L02411 Cutaneous abscess of right axilla: Secondary | ICD-10-CM | POA: Insufficient documentation

## 2014-10-21 DIAGNOSIS — L0291 Cutaneous abscess, unspecified: Secondary | ICD-10-CM

## 2014-10-21 DIAGNOSIS — L03111 Cellulitis of right axilla: Secondary | ICD-10-CM | POA: Insufficient documentation

## 2014-10-21 DIAGNOSIS — K219 Gastro-esophageal reflux disease without esophagitis: Secondary | ICD-10-CM | POA: Insufficient documentation

## 2014-10-21 DIAGNOSIS — E669 Obesity, unspecified: Secondary | ICD-10-CM | POA: Insufficient documentation

## 2014-10-21 DIAGNOSIS — Z8659 Personal history of other mental and behavioral disorders: Secondary | ICD-10-CM | POA: Insufficient documentation

## 2014-10-21 MED ORDER — CLINDAMYCIN HCL 150 MG PO CAPS: 300.0000 mg | ORAL_CAPSULE | Freq: Three times a day (TID) | ORAL | Status: DC

## 2014-10-21 MED ORDER — IBUPROFEN 800 MG PO TABS
800.0000 mg | ORAL_TABLET | Freq: Once | ORAL | Status: AC
  Administered 2014-10-21: 800 mg via ORAL
  Filled 2014-10-21: qty 1

## 2014-10-21 MED ORDER — OXYCODONE-ACETAMINOPHEN 5-325 MG PO TABS: 1.0000 | ORAL_TABLET | Freq: Four times a day (QID) | ORAL | Status: DC | PRN

## 2014-10-21 MED ORDER — CLINDAMYCIN HCL 150 MG PO CAPS: 150.0000 mg | ORAL_CAPSULE | Freq: Four times a day (QID) | ORAL | Status: DC

## 2014-10-21 MED ORDER — HYDROMORPHONE HCL 1 MG/ML IJ SOLN
1.0000 mg | Freq: Once | INTRAMUSCULAR | Status: AC
  Administered 2014-10-21: 1 mg via INTRAMUSCULAR
  Filled 2014-10-21: qty 1

## 2014-10-21 MED ORDER — HYDROCODONE-IBUPROFEN 7.5-200 MG PO TABS: 1.0000 | ORAL_TABLET | Freq: Four times a day (QID) | ORAL | Status: DC | PRN

## 2014-10-21 MED ORDER — CLINDAMYCIN HCL 300 MG PO CAPS
300.0000 mg | ORAL_CAPSULE | Freq: Once | ORAL | Status: AC
  Administered 2014-10-21: 300 mg via ORAL
  Filled 2014-10-21: qty 1

## 2014-10-21 MED ORDER — LIDOCAINE-EPINEPHRINE (PF) 2 %-1:200000 IJ SOLN
10.0000 mL | Freq: Once | INTRAMUSCULAR | Status: AC
  Administered 2014-10-21: 10 mL
  Filled 2014-10-21: qty 20

## 2014-10-21 MED ORDER — ONDANSETRON 4 MG PO TBDP
4.0000 mg | ORAL_TABLET | Freq: Once | ORAL | Status: AC
  Administered 2014-10-21: 4 mg via ORAL
  Filled 2014-10-21: qty 1

## 2014-10-21 MED ORDER — ONDANSETRON HCL 4 MG PO TABS: 4.0000 mg | ORAL_TABLET | Freq: Four times a day (QID) | ORAL | Status: DC

## 2014-10-21 NOTE — Discharge Instructions (Signed)
Abscess An abscess is an infected area that contains a collection of pus and debris.It can occur in almost any part of the body. An abscess is also known as a furuncle or boil. CAUSES  An abscess occurs when tissue gets infected. This can occur from blockage of oil or sweat glands, infection of hair follicles, or a minor injury to the skin. As the body tries to fight the infection, pus collects in the area and creates pressure under the skin. This pressure causes pain. People with weakened immune systems have difficulty fighting infections and get certain abscesses more often.  SYMPTOMS Usually an abscess develops on the skin and becomes a painful mass that is red, warm, and tender. If the abscess forms under the skin, you may feel a moveable soft area under the skin. Some abscesses break open (rupture) on their own, but most will continue to get worse without care. The infection can spread deeper into the body and eventually into the bloodstream, causing you to feel ill.  DIAGNOSIS  Your caregiver will take your medical history and perform a physical exam. A sample of fluid may also be taken from the abscess to determine what is causing your infection. TREATMENT  Your caregiver may prescribe antibiotic medicines to fight the infection. However, taking antibiotics alone usually does not cure an abscess. Your caregiver may need to make a small cut (incision) in the abscess to drain the pus. In some cases, gauze is packed into the abscess to reduce pain and to continue draining the area. HOME CARE INSTRUCTIONS   Only take over-the-counter or prescription medicines for pain, discomfort, or fever as directed by your caregiver.  If you were prescribed antibiotics, take them as directed. Finish them even if you start to feel better.  If gauze is used, follow your caregiver's directions for changing the gauze.  To avoid spreading the infection:  Keep your draining abscess covered with a  bandage.  Wash your hands well.  Do not share personal care items, towels, or whirlpools with others.  Avoid skin contact with others.  Keep your skin and clothes clean around the abscess.  Keep all follow-up appointments as directed by your caregiver. SEEK MEDICAL CARE IF:   You have increased pain, swelling, redness, fluid drainage, or bleeding.  You have muscle aches, chills, or a general ill feeling.  You have a fever. MAKE SURE YOU:  Incision and Drainage Incision and drainage is a procedure in which a sac-like structure (cystic structure) is opened and drained. The area to be drained usually contains material such as pus, fluid, or blood.  LET YOUR CAREGIVER KNOW ABOUT:   Allergies to medicine.  Medicines taken, including vitamins, herbs, eyedrops, over-the-counter medicines, and creams.  Use of steroids (by mouth or creams).  Previous problems with anesthetics or numbing medicines.  History of bleeding problems or blood clots.  Previous surgery.  Other health problems, including diabetes and kidney problems.  Possibility of pregnancy, if this applies. RISKS AND COMPLICATIONS  Pain.  Bleeding.  Scarring.  Infection. BEFORE THE PROCEDURE  You may need to have an ultrasound or other imaging tests to see how large or deep your cystic structure is. Blood tests may also be used to determine if you have an infection or how severe the infection is. You may need to have a tetanus shot. PROCEDURE  The affected area is cleaned with a cleaning fluid. The cyst area will then be numbed with a medicine (local anesthetic). A small incision  will be made in the cystic structure. A syringe or catheter may be used to drain the contents of the cystic structure, or the contents may be squeezed out. The area will then be flushed with a cleansing solution. After cleansing the area, it is often gently packed with a gauze or another wound dressing. Once it is packed, it will be covered  with gauze and tape or some other type of wound dressing. AFTER THE PROCEDURE   Often, you will be allowed to go home right after the procedure.  You may be given antibiotic medicine to prevent or heal an infection.  If the area was packed with gauze or some other wound dressing, you will likely need to come back in 1 to 2 days to get it removed.  The area should heal in about 14 days. Document Released: 03/31/2001 Document Revised: 04/05/2012 Document Reviewed: 11/30/2011 [facility] Patient Information 2015 [place], [place]. This information is not intended to replace advice given to you by your health care provider. Make sure you discuss any questions you have with your health care provider.   Understand these instructions.  Will watch your condition.  Will get help right away if you are not doing well or get worse. Document Released: 07/15/2005 Document Revised: 04/05/2012 Document Reviewed: 12/18/2011 [facility] Patient Information 2015 [place], [place]. This information is not intended to replace advice given to you by your health care provider. Make sure you discuss any questions you have with your health care provider.

## 2014-10-21 NOTE — ED Notes (Signed)
Pt. Has had recurrent boils in the same spot for the past couple of years. Some boils have had to be removed surgically in the past. Site is tender 10/10 pain.

## 2014-10-21 NOTE — ED Provider Notes (Signed)
CSN: [id number]     Arrival date & time 10/21/14  0555 History   First MD Initiated Contact with Patient 10/21/14 [phone]     Chief Complaint  Patient presents with  . Recurrent Skin Infections     (Consider location/radiation/quality/duration/timing/severity/associated sxs/prior Treatment) HPI    PCP: Steva Ready, MD  Stacie Carrillo is a 34 y.o.female with a significant PMH of depression, PCOS, obesity, reflux, anemia, anxiety,  presents to the ER with complaints of boil to right axilla. She has history of surgical I&D to the site for treatment in the past. He had another I&D 6 mo ago and had done well since then. She reports using deoderant because she needs to work and didn't want to smell. The infection started 2 days ago, she has been using warm soaks and Ibuprofen without relief. She denies having polyuria, fevers, abdominal pains, weakness, confusion.  Blood pressure 128/89, pulse 91, temperature 97.8 F (36.6 C), temperature source Oral, resp. rate 16, height  (1.651 m), weight 185 lb (83.915 kg), last menstrual period 10/28/2013, SpO2 100 %.   Past Medical History  Diagnosis Date  . Depression   . Polycystic ovarian syndrome   . Obesity   . Gestational diabetes   . Reflux   . Anemia   . Anxiety   . Ulcer    Past Surgical History  Procedure Laterality Date  . Tubal ligation    . Cesarean section      x4  . Irrigation and debridement abscess Right 10/09/2013    Procedure: IRRIGATION AND DEBRIDEMENT RIGHT AXILLARY ABSCESS;  Surgeon: Kandis Cocking, MD;  Location: [facility];  Service: General;  Laterality: Right;   Family History  Problem Relation Age of Onset  . Throat cancer Maternal Aunt   . Cirrhosis Mother   . Diabetes Paternal Grandmother    History  Substance Use Topics  . Smoking status: Current Every Day Smoker -- 0.25 packs/day for 19 years    Types: Cigarettes  . Smokeless tobacco: Never Used  . Alcohol Use: No   OB History    Gravida  Para Term Preterm AB TAB SAB Ectopic Multiple Living   0 0 0 0 0 0 4     Review of Systems  10 Systems reviewed and are negative for acute change except as noted in the HPI.    Allergies  Latex  Home Medications   Prior to Admission medications   Medication Sig Start Date End Date Taking? Authorizing Provider  ibuprofen (ADVIL,MOTRIN) 200 MG tablet Take 400 mg by mouth every 6 (six) hours as needed for mild pain or moderate pain.   Yes Historical Provider, MD  clindamycin (CLEOCIN) 150 MG capsule Take 1 capsule (150 mg total) by mouth every 6 (six) hours. 10/21/14   Stacie Carrillo Irine Seal, PA-C  clindamycin (CLEOCIN) 150 MG capsule Take 2 capsules (300 mg total) by mouth 3 (three) times daily. May dispense as  capsules 10/21/14   Stacie Matas, PA-C  metoCLOPramide (REGLAN) 10 MG tablet Take 1 tablet (10 mg total) by mouth every 8 (eight) hours as needed for nausea (nausea/headache). Patient not taking: Reported on 10/21/2014 09/01/14   Monte Fantasia, PA-C  ondansetron (ZOFRAN) 4 MG tablet Take 1 tablet (4 mg total) by mouth every 6 (six) hours. 10/21/14   Stacie Clerk Irine Seal, PA-C  oxyCODONE-acetaminophen (PERCOCET/ROXICET) 5-325 MG per tablet Take 1 tablet by mouth every 6 (six) hours as needed for severe pain. 10/21/14  Stacie Matas, PA-C  oxyCODONE-acetaminophen (PERCOCET/ROXICET) 5-325 MG per tablet Take 1-2 tablets by mouth every 6 (six) hours as needed for severe pain. May take 2 tablets PO q 6 hours for severe pain - Do not take with Tylenol as this tablet already contains tylenol 10/21/14   Stacie Matas, PA-C   BP 128/89 mmHg  Pulse 91  Temp(Src) 97.8 F (36.6 C) (Oral)  Resp 16  Ht  (1.651 m)  Wt 185 lb (83.915 kg)  BMI 30.79 kg/m2  SpO2 100%  LMP 10/28/2013 Physical Exam  Constitutional: She appears well-developed and well-nourished. No distress.  HENT:  Head: Normocephalic and atraumatic.  Eyes: Pupils are equal, round, and reactive to light.  Neck: Normal  range of motion. Neck supple.  Cardiovascular: Normal rate and regular rhythm.   Pulmonary/Chest: Effort normal.  Abdominal: Soft.  Neurological: She is alert.  Skin: Skin is warm and dry.     Nursing note and vitals reviewed.   ED Course  Procedures (including critical care time) Labs Review Labs Reviewed - No data to display  Imaging Review No results found.   EKG Interpretation None      MDM   Final diagnoses:  Abscess and cellulitis    INCISION AND DRAINAGE Performed by: Stacie Carrillo Consent: Verbal consent obtained. Risks and benefits: risks, benefits and alternatives were discussed Type: abscess Body area: right axilla Anesthesia: local infiltration Incision was made with a scalpel. Local anesthetic: lidocaine 2% w epinephrine Anesthetic total: 3 ml Complexity: complex Blunt dissection to break up loculations Drainage: purulent Drainage amount: large Patient tolerance: Patient tolerated the procedure well with no immediate complications.  Medications  clindamycin (CLEOCIN) capsule 300 mg (not administered)  HYDROmorphone (DILAUDID) injection 1 mg (1 mg Intramuscular Given 10/21/14 0659)  ibuprofen (ADVIL,MOTRIN) tablet 800 mg (800 mg Oral Given 10/21/14 0659)  ondansetron (ZOFRAN-ODT) disintegrating tablet 4 mg (4 mg Oral Given 10/21/14 0659)  lidocaine-EPINEPHrine (XYLOCAINE W/EPI) 2 %-1:200000 (PF) injection 10 mL (10 mLs Other Given 10/21/14 0659)   Rx: Percocet and Clindamycin  33 y.o.Stacie Carrillo's evaluation in the Emergency Department is complete. It has been determined that no acute conditions requiring further emergency intervention are present at this time. The patient/guardian have been advised of the diagnosis and plan. We have discussed signs and symptoms that warrant return to the ED, such as changes or worsening in symptoms.  Vital signs are stable at discharge. Filed Vitals:   10/21/14 0605  BP: 128/89  Pulse: 91  Temp: 97.8 F  (36.6 C)  Resp: 16    Patient/guardian has voiced understanding and agreed to follow-up with the PCP or specialist.      Stacie Matas, PA-C 10/21/14 1610  Rolland Porter, MD 10/28/14 2319

## 2014-12-02 ENCOUNTER — Encounter (HOSPITAL_COMMUNITY): Payer: Self-pay | Admitting: Emergency Medicine

## 2014-12-02 ENCOUNTER — Emergency Department (HOSPITAL_COMMUNITY)
Admission: EM | Admit: 2014-12-02 | Discharge: 2014-12-03 | Disposition: A | Discharge status: Home / Self Care | Payer: No Typology Code available for payment source | Attending: Emergency Medicine | Admitting: Emergency Medicine

## 2014-12-02 DIAGNOSIS — G43909 Migraine, unspecified, not intractable, without status migrainosus: Secondary | ICD-10-CM | POA: Insufficient documentation

## 2014-12-02 DIAGNOSIS — S199XXA Unspecified injury of neck, initial encounter: Secondary | ICD-10-CM | POA: Diagnosis present

## 2014-12-02 DIAGNOSIS — G43009 Migraine without aura, not intractable, without status migrainosus: Secondary | ICD-10-CM

## 2014-12-02 DIAGNOSIS — Y998 Other external cause status: Secondary | ICD-10-CM | POA: Insufficient documentation

## 2014-12-02 DIAGNOSIS — S0990XA Unspecified injury of head, initial encounter: Secondary | ICD-10-CM | POA: Diagnosis not present

## 2014-12-02 DIAGNOSIS — Y92410 Unspecified street and highway as the place of occurrence of the external cause: Secondary | ICD-10-CM | POA: Diagnosis not present

## 2014-12-02 DIAGNOSIS — F329 Major depressive disorder, single episode, unspecified: Secondary | ICD-10-CM | POA: Insufficient documentation

## 2014-12-02 DIAGNOSIS — Z862 Personal history of diseases of the blood and blood-forming organs and certain disorders involving the immune mechanism: Secondary | ICD-10-CM | POA: Diagnosis not present

## 2014-12-02 DIAGNOSIS — Z8632 Personal history of gestational diabetes: Secondary | ICD-10-CM | POA: Insufficient documentation

## 2014-12-02 DIAGNOSIS — Z872 Personal history of diseases of the skin and subcutaneous tissue: Secondary | ICD-10-CM | POA: Diagnosis not present

## 2014-12-02 DIAGNOSIS — E669 Obesity, unspecified: Secondary | ICD-10-CM | POA: Diagnosis not present

## 2014-12-02 DIAGNOSIS — S4991XA Unspecified injury of right shoulder and upper arm, initial encounter: Secondary | ICD-10-CM | POA: Insufficient documentation

## 2014-12-02 DIAGNOSIS — Z79899 Other long term (current) drug therapy: Secondary | ICD-10-CM | POA: Insufficient documentation

## 2014-12-02 DIAGNOSIS — F419 Anxiety disorder, unspecified: Secondary | ICD-10-CM | POA: Diagnosis not present

## 2014-12-02 DIAGNOSIS — Z91040 Latex allergy status: Secondary | ICD-10-CM | POA: Diagnosis not present

## 2014-12-02 DIAGNOSIS — S161XXA Strain of muscle, fascia and tendon at neck level, initial encounter: Secondary | ICD-10-CM | POA: Diagnosis not present

## 2014-12-02 DIAGNOSIS — Y9389 Activity, other specified: Secondary | ICD-10-CM | POA: Diagnosis not present

## 2014-12-02 DIAGNOSIS — Z720 Tobacco use: Secondary | ICD-10-CM | POA: Insufficient documentation

## 2014-12-02 DIAGNOSIS — Z8719 Personal history of other diseases of the digestive system: Secondary | ICD-10-CM | POA: Diagnosis not present

## 2014-12-02 MED ORDER — KETOROLAC TROMETHAMINE 60 MG/2ML IM SOLN
60.0000 mg | Freq: Once | INTRAMUSCULAR | Status: AC
  Administered 2014-12-02: 60 mg via INTRAMUSCULAR
  Filled 2014-12-02: qty 2

## 2014-12-02 MED ORDER — LORAZEPAM 1 MG PO TABS
1.0000 mg | ORAL_TABLET | Freq: Once | ORAL | Status: AC
  Administered 2014-12-02: 1 mg via ORAL
  Filled 2014-12-02: qty 1

## 2014-12-02 MED ORDER — METOCLOPRAMIDE HCL 10 MG PO TABS
10.0000 mg | ORAL_TABLET | Freq: Once | ORAL | Status: AC
Start: 2014-12-02 — End: 2014-12-02
  Administered 2014-12-02: 10 mg via ORAL
  Filled 2014-12-02: qty 1
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
## 2014-12-03 MED ORDER — LORAZEPAM 1 MG PO TABS: 1.0000 mg | ORAL_TABLET | Freq: Three times a day (TID) | ORAL | Status: DC | PRN

## 2014-12-03 MED ORDER — IBUPROFEN 600 MG PO TABS: 600.0000 mg | ORAL_TABLET | Freq: Four times a day (QID) | ORAL | Status: DC | PRN

## 2014-12-03 MED ORDER — METOCLOPRAMIDE HCL 10 MG PO TABS: 10.0000 mg | ORAL_TABLET | Freq: Three times a day (TID) | ORAL | Status: DC | PRN

## 2014-12-03 NOTE — Discharge Instructions (Signed)
Cervical Sprain °A cervical sprain is an injury in the neck in which the strong, fibrous tissues (ligaments) that connect your neck bones stretch or tear. Cervical sprains can range from mild to severe. Severe cervical sprains can cause the neck vertebrae to be unstable. This can lead to damage of the spinal cord and can result in serious nervous system problems. The amount of time it takes for a cervical sprain to get better depends on the cause and extent of the injury. Most cervical sprains heal in 1 to 3 weeks. °CAUSES  °Severe cervical sprains may be caused by:  °· Contact sport injuries (such as from football, rugby, wrestling, hockey, auto racing, gymnastics, diving, martial arts, or boxing).   °· Motor vehicle collisions.   °· Whiplash injuries. This is an injury from a sudden forward and backward whipping movement of the head and neck.  °· Falls.   °Mild cervical sprains may be caused by:  °· Being in an awkward position, such as while cradling a telephone between your ear and shoulder.   °· Sitting in a chair that does not offer proper support.   °· Working at a poorly designed computer station.   °· Looking up or down for long periods of time.   °SYMPTOMS  °· Pain, soreness, stiffness, or a burning sensation in the front, back, or sides of the neck. This discomfort may develop immediately after the injury or slowly, 24 hours or more after the injury.   °· Pain or tenderness directly in the middle of the back of the neck.   °· Shoulder or upper back pain.   °· Limited ability to move the neck.   °· Headache.   °· Dizziness.   °· Weakness, numbness, or tingling in the hands or arms.   °· Muscle spasms.   °· Difficulty swallowing or chewing.   °· Tenderness and swelling of the neck.   °DIAGNOSIS  °Most of the time your health care provider can diagnose a cervical sprain by taking your history and doing a physical exam. Your health care provider will ask about previous neck injuries and any known neck  problems, such as arthritis in the neck. X-rays may be taken to find out if there are any other problems, such as with the bones of the neck. Other tests, such as a CT scan or MRI, may also be needed.  °TREATMENT  °Treatment depends on the severity of the cervical sprain. Mild sprains can be treated with rest, keeping the neck in place (immobilization), and pain medicines. Severe cervical sprains are immediately immobilized. Further treatment is done to help with pain, muscle spasms, and other symptoms and may include: °· Medicines, such as pain relievers, numbing medicines, or muscle relaxants.   °· Physical therapy. This may involve stretching exercises, strengthening exercises, and posture training. Exercises and improved posture can help stabilize the neck, strengthen muscles, and help stop symptoms from returning.   °HOME CARE INSTRUCTIONS  °· Put ice on the injured area.   °¨ Put ice in a plastic bag.   °¨ Place a towel between your skin and the bag.   °¨ Leave the ice on for 15-20 minutes, 3-4 times a day.   °· If your injury was severe, you may have been given a cervical collar to wear. A cervical collar is a two-piece collar designed to keep your neck from moving while it heals. °¨ Do not remove the collar unless instructed by your health care provider. °¨ If you have long hair, keep it outside of the collar. °¨ Ask your health care provider before making any adjustments to your collar. Minor   adjustments may be required over time to improve comfort and reduce pressure on your chin or on the back of your head.  Ifyou are allowed to remove the collar for cleaning or bathing, follow your health care provider's instructions on how to do so safely.  Keep your collar clean by wiping it with mild soap and water and drying it completely. If the collar you have been given includes removable pads, remove them every 1-2 days and hand wash them with soap and water. Allow them to air dry. They should be completely  dry before you wear them in the collar.  If you are allowed to remove the collar for cleaning and bathing, wash and dry the skin of your neck. Check your skin for irritation or sores. If you see any, tell your health care provider.  Do not drive while wearing the collar.   Only take over-the-counter or prescription medicines for pain, discomfort, or fever as directed by your health care provider.   Keep all follow-up appointments as directed by your health care provider.   Keep all physical therapy appointments as directed by your health care provider.   Make any needed adjustments to your workstation to promote good posture.   Avoid positions and activities that make your symptoms worse.   Warm up and stretch before being active to help prevent problems.  SEEK MEDICAL CARE IF:   Your pain is not controlled with medicine.   You are unable to decrease your pain medicine over time as planned.   Your activity level is not improving as expected.  SEEK IMMEDIATE MEDICAL CARE IF:   You develop any bleeding.  You develop stomach upset.  You have signs of an allergic reaction to your medicine.   Your symptoms get worse.   You develop new, unexplained symptoms.   You have numbness, tingling, weakness, or paralysis in any part of your body.  MAKE SURE YOU:   Understand these instructions.  Will watch your condition.  Will get help right away if you are not doing well or get worse. Document Released: 08/02/2007 Document Revised: 10/10/2013 Document Reviewed: 04/12/2013 [facility] Patient Information 2015 [place], [place]. This information is not intended to replace advice given to you by your health care provider. Make sure you discuss any questions you have with your health care provider.  Migraine Headache A migraine headache is very bad, throbbing pain on one or both sides of your head. Talk to your doctor about what things may bring on (trigger) your migraine  headaches. HOME CARE  Only take medicines as told by your doctor.  Lie down in a dark, quiet room when you have a migraine.  Keep a journal to find out if certain things bring on migraine headaches. For example, write down:  What you eat and drink.  How much sleep you get.  Any change to your diet or medicines.  Lessen how much alcohol you drink.  Quit smoking if you smoke.  Get enough sleep.  Lessen any stress in your life.  Keep lights dim if bright lights bother you or make your migraines worse. GET HELP RIGHT AWAY IF:   Your migraine becomes really bad.  You have a fever.  You have a stiff neck.  You have trouble seeing.  Your muscles are weak, or you lose muscle control.  You lose your balance or have trouble walking.  You feel like you will pass out (faint), or you pass out.  You have really bad symptoms  that are different than your first symptoms. MAKE SURE YOU:   Understand these instructions.  Will watch your condition.  Will get help right away if you are not doing well or get worse. Document Released: 07/14/2008 Document Revised: 12/28/2011 Document Reviewed: 06/12/2013 [facility] Patient Information 2015 [place], [place]. This information is not intended to replace advice given to you by your health care provider. Make sure you discuss any questions you have with your health care provider.  Motor Vehicle Collision After a car crash (motor vehicle collision), it is normal to have bruises and sore muscles. The first 24 hours usually feel the worst. After that, you will likely start to feel better each day. HOME CARE  Put ice on the injured area.  Put ice in a plastic bag.  Place a towel between your skin and the bag.  Leave the ice on for 15-20 minutes, 03-04 times a day.  Drink enough fluids to keep your pee (urine) clear or pale yellow.  Do not drink alcohol.  Take a warm shower or bath 1 or 2 times a day. This helps your sore  muscles.  Return to activities as told by your doctor. Be careful when lifting. Lifting can make neck or back pain worse.  Only take medicine as told by your doctor. Do not use aspirin. GET HELP RIGHT AWAY IF:   Your arms or legs tingle, feel weak, or lose feeling (numbness).  You have headaches that do not get better with medicine.  You have neck pain, especially in the middle of the back of your neck.  You cannot control when you pee (urinate) or poop (bowel movement).  Pain is getting worse in any part of your body.  You are short of breath, dizzy, or pass out (faint).  You have chest pain.  You feel sick to your stomach (nauseous), throw up (vomit), or sweat.  You have belly (abdominal) pain that gets worse.  There is blood in your pee, poop, or throw up.  You have pain in your shoulder (shoulder strap areas).  Your problems are getting worse. MAKE SURE YOU:   Understand these instructions.  Will watch your condition.  Will get help right away if you are not doing well or get worse. Document Released: 03/23/2008 Document Revised: 12/28/2011 Document Reviewed: 03/04/2011 [facility] Patient Information 2015 [place], [place]. This information is not intended to replace advice given to you by your health care provider. Make sure you discuss any questions you have with your health care provider.

## 2015-01-11 ENCOUNTER — Emergency Department (HOSPITAL_COMMUNITY): Payer: Medicaid Other

## 2015-01-11 ENCOUNTER — Encounter (HOSPITAL_COMMUNITY): Payer: Self-pay | Admitting: Emergency Medicine

## 2015-01-11 ENCOUNTER — Emergency Department (HOSPITAL_COMMUNITY)
Admission: EM | Admit: 2015-01-11 | Discharge: 2015-01-11 | Disposition: A | Discharge status: Home / Self Care | Payer: Medicaid Other | Attending: Emergency Medicine | Admitting: Emergency Medicine

## 2015-01-11 DIAGNOSIS — Z8632 Personal history of gestational diabetes: Secondary | ICD-10-CM | POA: Insufficient documentation

## 2015-01-11 DIAGNOSIS — Z9851 Tubal ligation status: Secondary | ICD-10-CM | POA: Insufficient documentation

## 2015-01-11 DIAGNOSIS — F329 Major depressive disorder, single episode, unspecified: Secondary | ICD-10-CM | POA: Insufficient documentation

## 2015-01-11 DIAGNOSIS — E669 Obesity, unspecified: Secondary | ICD-10-CM | POA: Insufficient documentation

## 2015-01-11 DIAGNOSIS — Z91040 Latex allergy status: Secondary | ICD-10-CM | POA: Insufficient documentation

## 2015-01-11 DIAGNOSIS — K2100 Gastro-esophageal reflux disease with esophagitis, without bleeding: Secondary | ICD-10-CM

## 2015-01-11 DIAGNOSIS — Z720 Tobacco use: Secondary | ICD-10-CM | POA: Insufficient documentation

## 2015-01-11 DIAGNOSIS — Z9889 Other specified postprocedural states: Secondary | ICD-10-CM | POA: Insufficient documentation

## 2015-01-11 DIAGNOSIS — K224 Dyskinesia of esophagus: Secondary | ICD-10-CM | POA: Insufficient documentation

## 2015-01-11 DIAGNOSIS — F419 Anxiety disorder, unspecified: Secondary | ICD-10-CM | POA: Insufficient documentation

## 2015-01-11 DIAGNOSIS — Z862 Personal history of diseases of the blood and blood-forming organs and certain disorders involving the immune mechanism: Secondary | ICD-10-CM | POA: Insufficient documentation

## 2015-01-11 LAB — COMPREHENSIVE METABOLIC PANEL
ALT: 12 U/L (ref 0–35)
AST: 18 U/L (ref 0–37)
Albumin: 4.2 g/dL (ref 3.5–5.2)
Alkaline Phosphatase: 52 U/L (ref 39–117)
Anion gap: 9 (ref 5–15)
BILIRUBIN TOTAL: 1.4 mg/dL — AB (ref 0.3–1.2)
BUN: 10 mg/dL (ref 6–23)
CO2: 22 mmol/L (ref 19–32)
Calcium: 8.9 mg/dL (ref 8.4–10.5)
Chloride: 109 mmol/L (ref 96–112)
Creatinine, Ser: 0.62 mg/dL (ref 0.50–1.10)
GLUCOSE: 102 mg/dL — AB (ref 70–99)
Potassium: 3.6 mmol/L (ref 3.5–5.1)
Sodium: 140 mmol/L (ref 135–145)
Total Protein: 7.6 g/dL (ref 6.0–8.3)

## 2015-01-11 LAB — CBC
HEMATOCRIT: 39.7 % (ref 36.0–46.0)
Hemoglobin: 14.3 g/dL (ref 12.0–15.0)
MCH: 32.8 pg (ref 26.0–34.0)
MCHC: 36 g/dL (ref 30.0–36.0)
MCV: 91.1 fL (ref 78.0–100.0)
Platelets: 221 10*3/uL (ref 150–400)
RBC: 4.36 MIL/uL (ref 3.87–5.11)
RDW: 12.9 % (ref 11.5–15.5)
WBC: 15.4 10*3/uL — ABNORMAL HIGH (ref 4.0–10.5)

## 2015-01-11 LAB — LIPASE, BLOOD: Lipase: 20 U/L (ref 11–59)

## 2015-01-11 LAB — BRAIN NATRIURETIC PEPTIDE: B NATRIURETIC PEPTIDE 5: 16.8 pg/mL (ref 0.0–100.0)

## 2015-01-11 MED ORDER — PANTOPRAZOLE SODIUM 40 MG PO TBEC
40.0000 mg | DELAYED_RELEASE_TABLET | Freq: Once | ORAL | Status: AC
  Administered 2015-01-11: 40 mg via ORAL
  Filled 2015-01-11: qty 1

## 2015-01-11 MED ORDER — SUCRALFATE 1 G PO TABS: 1.0000 g | ORAL_TABLET | Freq: Four times a day (QID) | ORAL | Status: DC

## 2015-01-11 MED ORDER — GI COCKTAIL ~~LOC~~
30.0000 mL | Freq: Once | ORAL | Status: AC
  Administered 2015-01-11: 30 mL via ORAL
  Filled 2015-01-11: qty 30

## 2015-01-11 MED ORDER — OMEPRAZOLE 20 MG PO CPDR: 20.0000 mg | DELAYED_RELEASE_CAPSULE | Freq: Two times a day (BID) | ORAL | Status: DC

## 2015-01-11 MED ORDER — ONDANSETRON 4 MG PO TBDP: 4.0000 mg | ORAL_TABLET | Freq: Three times a day (TID) | ORAL | Status: DC | PRN

## 2015-01-11 MED ORDER — HYDROCODONE-ACETAMINOPHEN 5-325 MG PO TABS: 2.0000 | ORAL_TABLET | ORAL | Status: DC | PRN

## 2015-01-11 NOTE — ED Notes (Signed)
Pt from home c/o centralized chest  Pain since this am. She reports 2 episode of vomiting. She c./o a feeling in her throat that feel tight.

## 2015-01-11 NOTE — ED Provider Notes (Signed)
CSN: [id number]     Arrival date & time 01/11/15  [id number] History   First MD Initiated Contact with Patient 01/11/15 0945     Chief Complaint  Patient presents with  . Chest Pain  . Nausea     HPI  She presents for evaluation of chest pain. States she vomited twice this morning. She awakened feeling nausea. Had 2 episodes of vomiting. Positive tenderness later felt a burning in her chest and is asthmatic sharp pain in her chest. This is been intermittent since that time but is becoming less frequent and severe now. Past medical history of ulcer disease. Previous on Dexilant. Has been off for over 9 months. Advil occasionally. He smoker. No alcohol use. 1-3 caffeinated beverages per day.  No blood in urine emesis morning. No dark stools recently. No other food intolerances. No history of biliary tract disease or symptoms.  Past Medical History  Diagnosis Date  . Depression   . Polycystic ovarian syndrome   . Obesity   . Gestational diabetes   . Reflux   . Anemia   . Anxiety   . Ulcer    Past Surgical History  Procedure Laterality Date  . Tubal ligation    . Cesarean section      x4  . Irrigation and debridement abscess Right 10/09/2013    Procedure: IRRIGATION AND DEBRIDEMENT RIGHT AXILLARY ABSCESS;  Surgeon: Kandis Cockingavid H Newman, MD;  Location: [facility];  Service: General;  Laterality: Right;   Family History  Problem Relation Age of Onset  . Throat cancer Maternal Aunt   . Cirrhosis Mother   . Diabetes Paternal Grandmother    History  Substance Use Topics  . Smoking status: Current Every Day Smoker -- 0.25 packs/day for 19 years    Types: Cigarettes  . Smokeless tobacco: Never Used  . Alcohol Use: No   OB History    Gravida Para Term Preterm AB TAB SAB Ectopic Multiple Living   4 4 4  0 0 0 0 0 0 4     Review of Systems  Constitutional: Negative for fever, chills, diaphoresis, appetite change and fatigue.  HENT: Negative for mouth sores, sore throat and trouble swallowing.    Eyes: Negative for visual disturbance.  Respiratory: Negative for cough, chest tightness, shortness of breath and wheezing.   Cardiovascular: Positive for chest pain.  Gastrointestinal: Positive for nausea, vomiting and abdominal pain. Negative for diarrhea and abdominal distention.  Endocrine: Negative for polydipsia, polyphagia and polyuria.  Genitourinary: Negative for dysuria, frequency and hematuria.  Musculoskeletal: Negative for gait problem.  Skin: Negative for color change, pallor and rash.  Neurological: Negative for dizziness, syncope, light-headedness and headaches.  Hematological: Does not bruise/bleed easily.  Psychiatric/Behavioral: Negative for behavioral problems and confusion.      Allergies  Latex  Home Medications   Prior to Admission medications   Medication Sig Start Date End Date Taking? Authorizing Provider  clindamycin (CLEOCIN) 150 MG capsule Take 1 capsule (150 mg total) by mouth every 6 (six) hours. Patient not taking: Reported on 01/11/2015 10/21/14   Marlon Peliffany Greene, PA-C  HYDROcodone-acetaminophen (NORCO/VICODIN) 5-325 MG per tablet Take 2 tablets by mouth every 4 (four) hours as needed. 01/11/15   Rolland PorterMark Madelyne Millikan, MD  HYDROcodone-ibuprofen (VICOPROFEN) 7.5-200 MG per tablet Take 1 tablet by mouth every 6 (six) hours as needed for moderate pain. Patient not taking: Reported on 01/11/2015 10/21/14   Marlon Peliffany Greene, PA-C  ibuprofen (ADVIL,MOTRIN) 600 MG tablet Take 1 tablet (600 mg total) by  mouth every 6 (six) hours as needed for mild pain or moderate pain. Patient not taking: Reported on 01/11/2015 12/03/14   Loren Racer, MD  LORazepam (ATIVAN) 1 MG tablet Take 1 tablet (1 mg total) by mouth every 8 (eight) hours as needed for anxiety. Patient not taking: Reported on 01/11/2015 12/03/14   Loren Racer, MD  metoCLOPramide (REGLAN) 10 MG tablet Take 1 tablet (10 mg total) by mouth every 8 (eight) hours as needed for nausea (nausea/headache). Patient not taking:  Reported on 01/11/2015 12/03/14   Loren Racer, MD  omeprazole (PRILOSEC) 20 MG capsule Take 1 capsule (20 mg total) by mouth 2 (two) times daily. 01/11/15   Rolland Porter, MD  ondansetron (ZOFRAN ODT) 4 MG disintegrating tablet Take 1 tablet (4 mg total) by mouth every 8 (eight) hours as needed for nausea. 01/11/15   Rolland Porter, MD  ondansetron (ZOFRAN) 4 MG tablet Take 1 tablet (4 mg total) by mouth every 6 (six) hours. Patient not taking: Reported on 01/11/2015 10/21/14   Marlon Pel, PA-C  sucralfate (CARAFATE) 1 G tablet Take 1 tablet (1 g total) by mouth 4 (four) times daily. 01/11/15   Rolland Porter, MD   BP 125/80 mmHg  Pulse 87  Temp(Src) 98.4 F (36.9 C) (Oral)  Resp 20  Wt 185 lb (83.915 kg)  SpO2 100%  LMP 12/25/2014 Physical Exam  Constitutional: She is oriented to person, place, and time. She appears well-developed and well-nourished. No distress.  HENT:  Head: Normocephalic.  Eyes: Conjunctivae are normal. Pupils are equal, round, and reactive to light. No scleral icterus.  Neck: Normal range of motion. Neck supple. No thyromegaly present.  Cardiovascular: Normal rate and regular rhythm.  Exam reveals no gallop and no friction rub.   No murmur heard. Pulmonary/Chest: Effort normal and breath sounds normal. No respiratory distress. She has no wheezes. She has no rales.  Clear bilateral breath sounds. No pleural or pericardial friction rubs.  Abdominal: Soft. Bowel sounds are normal. She exhibits no distension. There is no tenderness. There is no rebound.  Nontender in  the abdomen.  Musculoskeletal: Normal range of motion.  Neurological: She is alert and oriented to person, place, and time.  Skin: Skin is warm and dry. No rash noted.  Psychiatric: She has a normal mood and affect. Her behavior is normal.    ED Course  Procedures (including critical care time) Labs Review Labs Reviewed  CBC - Abnormal; Notable for the following:    WBC 15.4 (*)    All other components  within normal limits  COMPREHENSIVE METABOLIC PANEL - Abnormal; Notable for the following:    Glucose, Bld 102 (*)    Total Bilirubin 1.4 (*)    All other components within normal limits  BRAIN NATRIURETIC PEPTIDE  LIPASE, BLOOD    Imaging Review Dg Chest Port 1 View  01/11/2015   CLINICAL DATA:  Chest pain.  EXAM: PORTABLE CHEST - 1 VIEW  COMPARISON:  09/02/2013  FINDINGS: The heart size and mediastinal contours are within normal limits. Both lungs are clear. The visualized skeletal structures are unremarkable.  IMPRESSION: Normal chest.   Electronically Signed   By: Francene Boyers M.D.   On: 01/11/2015 10:36     EKG Interpretation None      MDM   Final diagnoses:  Esophageal spasm  Gastroesophageal reflux disease with esophagitis    Patient remains asymptomatic. Had an episode of vomiting followed by a spasmodic pain in her chest with burning. I think  this is very likely acid related with reflux/vomiting and esophagitis esophageal spasm. Chest x-ray shows no sign of free air in the chest to suggest rupture. Normal EKG. Normal exam. Stable vitals. Plan Carafate, Prilosec, reflux precautions, primary care follow-up. Avoid alcohol, tobacco, caffeine, anti-inflammatory.    Rolland Porter, MD 01/11/15 1352

## 2015-01-11 NOTE — ED Notes (Signed)
Patient comes from home with c/o N/V and chest tightness/pain.  Patient states she has had hx of ulcers and GERD.  Patient was taking Dexilant to treat, but hasn't taken that in months.  Patient also c/o a tightening sensation of espohagus.  Patient denies SOB or overt chest pain.

## 2015-01-11 NOTE — Discharge Instructions (Signed)
Esophageal Spasm Esophageal spasm is an uncoordinated contraction of the muscles of the esophagus (the tube which carries food from your mouth to your stomach). Normally, the muscles of the esophagus alternate between contraction and relaxation starting from the top of the esophagus and working down to the bottom. This moves the food from the mouth to the stomach. In esophageal spasm, all the muscles contract at once. This causes pain and fails to move the food along. As a result, you may have trouble swallowing.  Women are more likely than men to have esophageal spasm. The cause of the spasms is not known. Sometimes eating hot or cold foods triggers the condition and this may be due to an overly sensitive esophagus. This is not an infectious disease and cannot be passed to others. SYMPTOMS  Symptoms of esophageal spasm may include: chest pain, burning or pain with swallowing, and difficulty swallowing.  DIAGNOSIS  Esophageal spasm can be diagnosed by a test called manometry (pressure studies of the esophagus). In this test, a special tube is inserted down the esophagus. The tube measures the muscle activity of the esophagus. Abnormal contractions mixed with normal movement helps confirm the diagnosis.  A person with a hypersensitive esophagus may be diagnosed by inflating a long balloon in the person's esophagus. If this causes the same symptoms, preventive methods may work. PREVENTION  Avoid hot or cold foods if that seems to be a trigger. PROGNOSIS  This condition does not go away, nor is treatment entirely satisfactory. Patients need to be careful of what they eat. They need to continue on medication if a useful one is found. Fortunately, the condition does not get progressively worse as time passes. Esophageal spasm does not usually lead to more serious problems but sometimes the pain can be disabling. If a person becomes afraid to eat they may become malnourished and lose weight.  TREATMENT   A  procedure in which instruments of increasing size are inserted through the esophagus to enlarge (dilate) it are used.  Medications that decrease acid-production of the stomach may be used such as proton-pump inhibitors or H2-blockers.  Medications of several types can be used to relax the muscles of the esophagus.  An individual with a hypersensitive esophagus sometimes improves with low doses of medications normally used for depression.  No treatment for esophageal spasm is effective for everyone. Often several approaches will be tried before one works. In many cases, the symptoms will improve, but will not go away completely.  For severe cases, relief is obtained two-thirds of the time by cutting the muscles along the entire length of the esophagus. This is a major surgical procedure.  Your symptoms are usually the best guide to how well the treatment for esophageal spasm works. SIDE EFFECTS OF TREATMENTS  Nitrates can cause headaches and low blood pressure.  Calcium channel blockers can cause:  Feeling sick to your stomach (nausea).  Constipation and other side effects.  Antidepressants can cause side effects that depend on the medication used. HOME CARE INSTRUCTIONS   Let your caregiver know if problems are getting worse, or if you get food stuck in your esophagus for longer than 1 hour or as directed and are unable to swallow liquid.  Take medications as directed and with permission of your caregiver. Ask about what to do if a medication seems to get stuck in your esophagus. Only take over-the-counter or prescription medicines for pain, discomfort, or fever as directed by your caregiver.  Soft and liquid foods  pass more easily than solid pieces. SEEK IMMEDIATE MEDICAL CARE IF:   You develop severe chest pain, especially if the pain is crushing or pressure-like and spreads to the arms, back, neck, or jaw, or if you have sweating, nausea, or shortness of breath. THIS COULD BE AN  EMERGENCY. Do not wait to see if the pain will go away. Get medical help at once. Call 911 or [phone] (operator). DO NOT drive yourself to the hospital.  Your chest pain gets worse and does not go away with rest.  You have an attack of chest pain lasting longer than usual despite rest and treatment with the medications your physician has prescribed.  You wake from sleep with chest pain or shortness of breath.  You feel dizzy or faint.  You have chest pain, not typical of your usual pain, caused by your esophagus for which you originally saw your caregiver. MAKE SURE YOU:   Understand these instructions.  Will watch your condition.  Will get help right away if you are not doing well or get worse. Document Released: 12/26/2002 Document Revised: 12/28/2011 Document Reviewed: 12/29/2013 [facility] Patient Information 2015 [place], [place]. This information is not intended to replace advice given to you by your health care provider. Make sure you discuss any questions you have with your health care provider.  Esophagitis Esophagitis is inflammation of the esophagus. It can involve swelling, soreness, and pain in the esophagus. This condition can make it difficult and painful to swallow. CAUSES  Most causes of esophagitis are not serious. Many different factors can cause esophagitis, including:  Gastroesophageal reflux disease (GERD). This is when acid from your stomach flows up into the esophagus.  Recurrent vomiting.  An allergic-type reaction.  Certain medicines, especially those that come in large pills.  Ingestion of harmful chemicals, such as household cleaning products.  Heavy alcohol use.  An infection of the esophagus.  Radiation treatment for cancer.  Certain diseases such as sarcoidosis, Crohn's disease, and scleroderma. These diseases may cause recurrent esophagitis. SYMPTOMS   Trouble swallowing.  Painful swallowing.  Chest pain.  Difficulty  breathing.  Nausea.  Vomiting.  Abdominal pain. DIAGNOSIS  Your caregiver will take your history and do a physical exam. Depending upon what your caregiver finds, certain tests may also be done, including:  Barium X-ray. You will drink a solution that coats the esophagus, and X-rays will be taken.  Endoscopy. A lighted tube is put down the esophagus so your caregiver can examine the area.  Allergy tests. These can sometimes be arranged through follow-up visits. TREATMENT  Treatment will depend on the cause of your esophagitis. In some cases, steroids or other medicines may be given to help relieve your symptoms or to treat the underlying cause of your condition. Medicines that may be recommended include:  Viscous lidocaine, to soothe the esophagus.  Antacids.  Acid reducers.  Proton pump inhibitors.  Antiviral medicines for certain viral infections of the esophagus.  Antifungal medicines for certain fungal infections of the esophagus.  Antibiotic medicines, depending on the cause of the esophagitis. HOME CARE INSTRUCTIONS   Avoid foods and drinks that seem to make your symptoms worse.  Eat small, frequent meals instead of large meals.  Avoid eating for the 3 hours prior to your bedtime.  If you have trouble taking pills, use a pill splitter to decrease the size and likelihood of the pill getting stuck or injuring the esophagus on the way down. Drinking water after taking a pill also helps.  Stop smoking if you smoke.  Maintain a healthy weight.  Wear loose-fitting clothing. Do not wear anything tight around your waist that causes pressure on your stomach.  Raise the head of your bed 6 to 8 inches with wood blocks to help you sleep. Extra pillows will not help.  Only take over-the-counter or prescription medicines as directed by your caregiver. SEEK IMMEDIATE MEDICAL CARE IF:  You have severe chest pain that radiates into your arm, neck, or jaw.  You feel sweaty,  dizzy, or lightheaded.  You have shortness of breath.  You vomit blood.  You have difficulty or pain with swallowing.  You have bloody or black, tarry stools.  You have a fever.  You have a burning sensation in the chest more than 3 times a week for more than 2 weeks.  You cannot swallow, drink, or eat.  You drool because you cannot swallow your saliva. MAKE SURE YOU:  Understand these instructions.  Will watch your condition.  Will get help right away if you are not doing well or get worse. Document Released: 11/12/2004 Document Revised: 12/28/2011 Document Reviewed: 06/05/2011 [facility] Patient Information 2015 [place], [place]. This information is not intended to replace advice given to you by your health care provider. Make sure you discuss any questions you have with your health care provider.  Esophagitis Esophagitis is inflammation of the esophagus. It can involve swelling, soreness, and pain in the esophagus. This condition can make it difficult and painful to swallow. CAUSES  Most causes of esophagitis are not serious. Many different factors can cause esophagitis, including:  Gastroesophageal reflux disease (GERD). This is when acid from your stomach flows up into the esophagus.  Recurrent vomiting.  An allergic-type reaction.  Certain medicines, especially those that come in large pills.  Ingestion of harmful chemicals, such as household cleaning products.  Heavy alcohol use.  An infection of the esophagus.  Radiation treatment for cancer.  Certain diseases such as sarcoidosis, Crohn's disease, and scleroderma. These diseases may cause recurrent esophagitis. SYMPTOMS   Trouble swallowing.  Painful swallowing.  Chest pain.  Difficulty breathing.  Nausea.  Vomiting.  Abdominal pain. DIAGNOSIS  Your caregiver will take your history and do a physical exam. Depending upon what your caregiver finds, certain tests may also be done,  including:  Barium X-ray. You will drink a solution that coats the esophagus, and X-rays will be taken.  Endoscopy. A lighted tube is put down the esophagus so your caregiver can examine the area.  Allergy tests. These can sometimes be arranged through follow-up visits. TREATMENT  Treatment will depend on the cause of your esophagitis. In some cases, steroids or other medicines may be given to help relieve your symptoms or to treat the underlying cause of your condition. Medicines that may be recommended include:  Viscous lidocaine, to soothe the esophagus.  Antacids.  Acid reducers.  Proton pump inhibitors.  Antiviral medicines for certain viral infections of the esophagus.  Antifungal medicines for certain fungal infections of the esophagus.  Antibiotic medicines, depending on the cause of the esophagitis. HOME CARE INSTRUCTIONS   Avoid foods and drinks that seem to make your symptoms worse.  Eat small, frequent meals instead of large meals.  Avoid eating for the 3 hours prior to your bedtime.  If you have trouble taking pills, use a pill splitter to decrease the size and likelihood of the pill getting stuck or injuring the esophagus on the way down. Drinking water after taking a pill also  helps.  Stop smoking if you smoke.  Maintain a healthy weight.  Wear loose-fitting clothing. Do not wear anything tight around your waist that causes pressure on your stomach.  Raise the head of your bed 6 to 8 inches with wood blocks to help you sleep. Extra pillows will not help.  Only take over-the-counter or prescription medicines as directed by your caregiver. SEEK IMMEDIATE MEDICAL CARE IF:  You have severe chest pain that radiates into your arm, neck, or jaw.  You feel sweaty, dizzy, or lightheaded.  You have shortness of breath.  You vomit blood.  You have difficulty or pain with swallowing.  You have bloody or black, tarry stools.  You have a fever.  You have a  burning sensation in the chest more than 3 times a week for more than 2 weeks.  You cannot swallow, drink, or eat.  You drool because you cannot swallow your saliva. MAKE SURE YOU:  Understand these instructions.  Will watch your condition.  Will get help right away if you are not doing well or get worse. Document Released: 11/12/2004 Document Revised: 12/28/2011 Document Reviewed: 06/05/2011 [facility] Patient Information 2015 [place]. This information is not intended to replace advice given to you by your health care provider. Make sure you discuss any questions you have with your health care provider.  Gastroesophageal Reflux Disease, Adult Gastroesophageal reflux disease (GERD) happens when acid from your stomach flows up into the esophagus. When acid comes in contact with the esophagus, the acid causes soreness (inflammation) in the esophagus. Over time, GERD may create small holes (ulcers) in the lining of the esophagus. CAUSES   Increased body weight. This puts pressure on the stomach, making acid rise from the stomach into the esophagus.  Smoking. This increases acid production in the stomach.  Drinking alcohol. This causes decreased pressure in the lower esophageal sphincter (valve or ring of muscle between the esophagus and stomach), allowing acid from the stomach into the esophagus.  Late evening meals and a full stomach. This increases pressure and acid production in the stomach.  A malformed lower esophageal sphincter. Sometimes, no cause is found. SYMPTOMS   Burning pain in the lower part of the mid-chest behind the breastbone and in the mid-stomach area. This may occur twice a week or more often.  Trouble swallowing.  Sore throat.  Dry cough.  Asthma-like symptoms including chest tightness, shortness of breath, or wheezing. DIAGNOSIS  Your caregiver may be able to diagnose GERD based on your symptoms. In some cases, X-rays and other tests may be done to  check for complications or to check the condition of your stomach and esophagus. TREATMENT  Your caregiver may recommend over-the-counter or prescription medicines to help decrease acid production. Ask your caregiver before starting or adding any new medicines.  HOME CARE INSTRUCTIONS   Change the factors that you can control. Ask your caregiver for guidance concerning weight loss, quitting smoking, and alcohol consumption.  Avoid foods and drinks that make your symptoms worse, such as:  Caffeine or alcoholic drinks.  Chocolate.  Peppermint or mint flavorings.  Garlic and onions.  Spicy foods.  Citrus fruits, such as oranges, lemons, or limes.  Tomato-based foods such as sauce, chili, salsa, and pizza.  Fried and fatty foods.  Avoid lying down for the 3 hours prior to your bedtime or prior to taking a nap.  Eat small, frequent meals instead of large meals.  Wear loose-fitting clothing. Do not wear anything tight around your  waist that causes pressure on your stomach.  Raise the head of your bed 6 to 8 inches with wood blocks to help you sleep. Extra pillows will not help.  Only take over-the-counter or prescription medicines for pain, discomfort, or fever as directed by your caregiver.  Do not take aspirin, ibuprofen, or other nonsteroidal anti-inflammatory drugs (NSAIDs). SEEK IMMEDIATE MEDICAL CARE IF:   You have pain in your arms, neck, jaw, teeth, or back.  Your pain increases or changes in intensity or duration.  You develop nausea, vomiting, or sweating (diaphoresis).  You develop shortness of breath, or you faint.  Your vomit is green, yellow, black, or looks like coffee grounds or blood.  Your stool is red, bloody, or black. These symptoms could be signs of other problems, such as heart disease, gastric bleeding, or esophageal bleeding. MAKE SURE YOU:   Understand these instructions.  Will watch your condition.  Will get help right away if you are not  doing well or get worse. Document Released: 07/15/2005 Document Revised: 12/28/2011 Document Reviewed: 04/24/2011 [facility] Patient Information 2015 [place], [place]. This information is not intended to replace advice given to you by your health care provider. Make sure you discuss any questions you have with your health care provider.

## 2015-04-07 ENCOUNTER — Encounter (HOSPITAL_COMMUNITY): Payer: Self-pay | Admitting: Emergency Medicine

## 2015-04-07 ENCOUNTER — Emergency Department (HOSPITAL_COMMUNITY)
Admission: EM | Admit: 2015-04-07 | Discharge: 2015-04-07 | Disposition: A | Discharge status: Home / Self Care | Payer: Medicaid Other | Attending: Emergency Medicine | Admitting: Emergency Medicine

## 2015-04-07 DIAGNOSIS — E669 Obesity, unspecified: Secondary | ICD-10-CM | POA: Insufficient documentation

## 2015-04-07 DIAGNOSIS — Z872 Personal history of diseases of the skin and subcutaneous tissue: Secondary | ICD-10-CM | POA: Insufficient documentation

## 2015-04-07 DIAGNOSIS — Z862 Personal history of diseases of the blood and blood-forming organs and certain disorders involving the immune mechanism: Secondary | ICD-10-CM | POA: Insufficient documentation

## 2015-04-07 DIAGNOSIS — Z8632 Personal history of gestational diabetes: Secondary | ICD-10-CM | POA: Insufficient documentation

## 2015-04-07 DIAGNOSIS — Z720 Tobacco use: Secondary | ICD-10-CM | POA: Insufficient documentation

## 2015-04-07 DIAGNOSIS — Z91040 Latex allergy status: Secondary | ICD-10-CM | POA: Insufficient documentation

## 2015-04-07 DIAGNOSIS — K219 Gastro-esophageal reflux disease without esophagitis: Secondary | ICD-10-CM | POA: Insufficient documentation

## 2015-04-07 DIAGNOSIS — G43909 Migraine, unspecified, not intractable, without status migrainosus: Secondary | ICD-10-CM | POA: Insufficient documentation

## 2015-04-07 DIAGNOSIS — F419 Anxiety disorder, unspecified: Secondary | ICD-10-CM | POA: Insufficient documentation

## 2015-04-07 DIAGNOSIS — F329 Major depressive disorder, single episode, unspecified: Secondary | ICD-10-CM | POA: Insufficient documentation

## 2015-04-07 LAB — I-STAT CHEM 8, ED
BUN: 11 mg/dL (ref 6–20)
CALCIUM ION: 1.17 mmol/L (ref 1.12–1.23)
CHLORIDE: 107 mmol/L (ref 101–111)
CREATININE: 0.7 mg/dL (ref 0.44–1.00)
GLUCOSE: 94 mg/dL (ref 65–99)
HEMATOCRIT: 46 % (ref 36.0–46.0)
Hemoglobin: 15.6 g/dL — ABNORMAL HIGH (ref 12.0–15.0)
Potassium: 3.8 mmol/L (ref 3.5–5.1)
Sodium: 141 mmol/L (ref 135–145)
TCO2: 21 mmol/L (ref 0–100)

## 2015-04-07 MED ORDER — DIPHENHYDRAMINE HCL 50 MG/ML IJ SOLN
25.0000 mg | Freq: Once | INTRAMUSCULAR | Status: AC
  Administered 2015-04-07: 25 mg via INTRAVENOUS
  Filled 2015-04-07: qty 1

## 2015-04-07 MED ORDER — SODIUM CHLORIDE 0.9 % IV BOLUS (SEPSIS)
1000.0000 mL | Freq: Once | INTRAVENOUS | Status: AC
  Administered 2015-04-07: 1000 mL via INTRAVENOUS

## 2015-04-07 MED ORDER — METOCLOPRAMIDE HCL 5 MG/ML IJ SOLN
10.0000 mg | Freq: Once | INTRAMUSCULAR | Status: AC
  Administered 2015-04-07: 10 mg via INTRAVENOUS
  Filled 2015-04-07: qty 2

## 2015-04-07 MED ORDER — ONDANSETRON 4 MG PO TBDP
ORAL_TABLET | ORAL | Status: AC
  Filled 2015-04-07: qty 2

## 2015-04-07 MED ORDER — PROCHLORPERAZINE EDISYLATE 5 MG/ML IJ SOLN
10.0000 mg | Freq: Once | INTRAMUSCULAR | Status: AC
  Administered 2015-04-07: 10 mg via INTRAVENOUS
  Filled 2015-04-07: qty 2

## 2015-04-07 MED ORDER — GABAPENTIN 300 MG PO CAPS: 300.0000 mg | ORAL_CAPSULE | Freq: Every day | ORAL | Status: DC

## 2015-04-07 MED ORDER — BUTALBITAL-APAP-CAFFEINE 50-325-40 MG PO TABS: 1.0000 | ORAL_TABLET | Freq: Four times a day (QID) | ORAL | Status: DC | PRN

## 2015-04-07 NOTE — ED Provider Notes (Signed)
PROGRESS NOTE                                                                                                                 This is a sign-out from PA Piepenbrink at shift change: Stacie Carrillo is a 34 y.o. female presenting with typical migraine exacaerbation. Plan is to recheck pain level after headache cocktail. Please refer to previous note for full HPI, ROS, PMH and PE.   Since seen and evaluated the bedside, she rates her headache as 0 out of 10 after headache cocktail. She is requesting treatment for restless leg syndrome states that she thinks her migraine was brought on by lack of sleep over the last 3 nights from restless leg, states it only happens at night the pain eases when she walks. She does not have insurance or primary care physician.  Filed Vitals:   04/07/15 0530 04/07/15 0545 04/07/15 0600 04/07/15 0906  BP: 114/73  101/50 105/78  Pulse: 88 77 79 82  Temp:      TempSrc:      Resp:    18  Height:      Weight:      SpO2: 100% 100% 100% 99%    Medications  ondansetron (ZOFRAN-ODT) 4 MG disintegrating tablet (not administered)  sodium chloride 0.9 % bolus 1,000 mL (0 mLs Intravenous Stopped 04/07/15 0617)  metoCLOPramide (REGLAN) injection 10 mg (10 mg Intravenous Given 04/07/15 0521)  diphenhydrAMINE (BENADRYL) injection 25 mg (25 mg Intravenous Given 04/07/15 0521)  prochlorperazine (COMPAZINE) injection 10 mg (10 mg Intravenous Given 04/07/15 3785)    Evaluation does not show pathology that would require ongoing emergent intervention or inpatient treatment. Pt is hemodynamically stable and mentating appropriately. Discussed findings and plan with patient/guardian, who agrees with care plan. All questions answered. Return precautions discussed and outpatient follow up given.   Discharge Medication List as of 04/07/2015  8:45 AM    START taking these medications   Details  butalbital-acetaminophen-caffeine (FIORICET) 50-325-40 MG per tablet Take 1 tablet by mouth  every 6 (six) hours as needed for headache., Starting 04/07/2015, Until Discontinued, Print    gabapentin (NEURONTIN) 300 MG capsule Take 1 capsule (300 mg total) by mouth at bedtime. Start 300 mg by mouth daily before bedtime for 1 week, can increase to 2 pills before bed on day 8 if needed., Starting 04/07/2015, Until Discontinued, Print              Wynetta Emery, PA-C 04/07/15 8850  Zadie Rhine, MD 04/08/15 [phone]

## 2015-04-07 NOTE — ED Provider Notes (Signed)
CSN: [id number]     Arrival date & time 04/07/15  0118 History   First MD Initiated Contact with Patient 04/07/15 0357     Chief Complaint  Patient presents with  . Migraine     (Consider location/radiation/quality/duration/timing/severity/associated sxs/prior Treatment) HPI Comments: Patient is a 34 year old female past medical history significant for migraines, peptic ulcer disease, acid reflux presenting to the emergency department for a migraine headache that started 2 hours prior to arrival to the emergency department. Describes it as severe throbbing pounding in nature with associated photophobia, nausea, vomiting. States it is similar to previous headaches. Patient states after multiple episodes of emesis noticed bright red streaks of blood. Denies any abdominal pain or syncope. Tried ibuprofen with no improvement. Denies any fevers.   Past Medical History  Diagnosis Date  . Depression   . Polycystic ovarian syndrome   . Obesity   . Gestational diabetes   . Reflux   . Anemia   . Anxiety   . Ulcer    Past Surgical History  Procedure Laterality Date  . Tubal ligation    . Cesarean section      x4  . Irrigation and debridement abscess Right 10/09/2013    Procedure: IRRIGATION AND DEBRIDEMENT RIGHT AXILLARY ABSCESS;  Surgeon: Kandis Cocking, MD;  Location: [facility];  Service: General;  Laterality: Right;   Family History  Problem Relation Age of Onset  . Throat cancer Maternal Aunt   . Cirrhosis Mother   . Diabetes Paternal Grandmother    History  Substance Use Topics  . Smoking status: Current Every Day Smoker -- 0.25 packs/day for 19 years    Types: Cigarettes  . Smokeless tobacco: Never Used  . Alcohol Use: No   OB History    Gravida Para Term Preterm AB TAB SAB Ectopic Multiple Living   0 0 0 0 0 0 4     Review of Systems  Eyes: Positive for photophobia.  Gastrointestinal: Positive for nausea and vomiting.  Neurological: Positive for headaches.  All  other systems reviewed and are negative.     Allergies  Latex  Home Medications   Prior to Admission medications   Medication Sig Start Date End Date Taking? Authorizing Provider  ibuprofen (ADVIL,MOTRIN) 200 MG tablet Take 600 mg by mouth every 6 (six) hours as needed for headache.   Yes Historical Provider, MD  clindamycin (CLEOCIN) 150 MG capsule Take 1 capsule (150 mg total) by mouth every 6 (six) hours. Patient not taking: Reported on 01/11/2015 10/21/14   Marlon Pel, PA-C  HYDROcodone-acetaminophen (NORCO/VICODIN) 5-325 MG per tablet Take 2 tablets by mouth every 4 (four) hours as needed. Patient not taking: Reported on 04/07/2015 01/11/15   Rolland Porter, MD  HYDROcodone-ibuprofen (VICOPROFEN) 7.5-200 MG per tablet Take 1 tablet by mouth every 6 (six) hours as needed for moderate pain. Patient not taking: Reported on 01/11/2015 10/21/14   Marlon Pel, PA-C  ibuprofen (ADVIL,MOTRIN) 600 MG tablet Take 1 tablet (600 mg total) by mouth every 6 (six) hours as needed for mild pain or moderate pain. Patient not taking: Reported on 01/11/2015 12/03/14   Loren Racer, MD  LORazepam (ATIVAN) 1 MG tablet Take 1 tablet (1 mg total) by mouth every 8 (eight) hours as needed for anxiety. Patient not taking: Reported on 01/11/2015 12/03/14   Loren Racer, MD  metoCLOPramide (REGLAN) 10 MG tablet Take 1 tablet (10 mg total) by mouth every 8 (eight) hours as needed for nausea (nausea/headache). Patient  not taking: Reported on 01/11/2015 12/03/14   Loren Racer, MD  omeprazole (PRILOSEC) 20 MG capsule Take 1 capsule (20 mg total) by mouth 2 (two) times daily. Patient not taking: Reported on 04/07/2015 01/11/15   Rolland Porter, MD  ondansetron (ZOFRAN ODT) 4 MG disintegrating tablet Take 1 tablet (4 mg total) by mouth every 8 (eight) hours as needed for nausea. Patient not taking: Reported on 04/07/2015 01/11/15   Rolland Porter, MD  ondansetron (ZOFRAN) 4 MG tablet Take 1 tablet (4 mg total) by mouth every 6  (six) hours. Patient not taking: Reported on 01/11/2015 10/21/14   Marlon Pel, PA-C  sucralfate (CARAFATE) 1 G tablet Take 1 tablet (1 g total) by mouth 4 (four) times daily. Patient not taking: Reported on 04/07/2015 01/11/15   Rolland Porter, MD   BP 105/73 mmHg  Pulse 78  Temp(Src) 97.5 F (36.4 C) (Oral)  Resp 18  Ht 5\' 4"  (1.626 m)  Wt 180 lb (81.647 kg)  BMI 30.88 kg/m2  SpO2 100%  LMP 03/31/2015 Physical Exam  Constitutional: She is oriented to person, place, and time. She appears well-developed and well-nourished. No distress.  HENT:  Head: Normocephalic and atraumatic.  Right Ear: External ear normal.  Left Ear: External ear normal.  Nose: Nose normal.  Mouth/Throat: Oropharynx is clear and moist. No oropharyngeal exudate.  Eyes: Conjunctivae and EOM are normal. Pupils are equal, round, and reactive to light.  Neck: Normal range of motion. Neck supple.  Cardiovascular: Normal rate, regular rhythm, normal heart sounds and intact distal pulses.   Pulmonary/Chest: Effort normal and breath sounds normal. No respiratory distress.  Abdominal: Soft. Bowel sounds are normal. There is no tenderness.  Musculoskeletal: Normal range of motion. She exhibits no edema.  Neurological: She is alert and oriented to person, place, and time. She has normal strength. No cranial nerve deficit. Gait normal. GCS eye subscore is 4. GCS verbal subscore is 5. GCS motor subscore is 6.  Sensation grossly intact.  No pronator drift.  Bilateral heel-knee-shin intact.  Skin: Skin is warm and dry. She is not diaphoretic.  Nursing note and vitals reviewed.   ED Course  Procedures (including critical care time) Medications  ondansetron (ZOFRAN-ODT) 4 MG disintegrating tablet (not administered)  prochlorperazine (COMPAZINE) injection 10 mg (not administered)  sodium chloride 0.9 % bolus 1,000 mL (1,000 mLs Intravenous New Bag/Given 04/07/15 0521)  metoCLOPramide (REGLAN) injection 10 mg (10 mg Intravenous  Given 04/07/15 0521)  diphenhydrAMINE (BENADRYL) injection 25 mg (25 mg Intravenous Given 04/07/15 0521)    Labs Review Labs Reviewed  I-STAT CHEM 8, ED - Abnormal; Notable for the following:    Hemoglobin 15.6 (*)    All other components within normal limits    Imaging Review No results found.   EKG Interpretation None      MDM   Final diagnoses:  Migraine without status migrainosus, not intractable, unspecified migraine type    Filed Vitals:   04/07/15 0415  BP: 105/73  Pulse: 78  Temp:   Resp:    I have reviewed nursing notes, vital signs, and all appropriate lab and imaging results if ordered as above.    Presentation is like pts typical HA and non concerning for [facility], ICH, Meningitis, or temporal arteritis. Pt is afebrile with no focal neuro deficits, nuchal rigidity, or change in vision. Improvement in headache, will sign out to Sarita Bottom, PA-C pending re-evaluation.    Francee Piccolo, PA-C 04/07/15 8250  Zadie Rhine, MD 04/08/15 [phone]

## 2015-04-07 NOTE — Discharge Instructions (Signed)
Do not hesitate to return to the emergency room for any new, worsening or concerning symptoms.  Please obtain primary care using resource guide below. Let them know that you were seen in the emergency room and that they will need to obtain records for further outpatient management.    Migraine Headache A migraine headache is very bad, throbbing pain on one or both sides of your head. Talk to your doctor about what things may bring on (trigger) your migraine headaches. HOME CARE  Only take medicines as told by your doctor.  Lie down in a dark, quiet room when you have a migraine.  Keep a journal to find out if certain things bring on migraine headaches. For example, write down:  What you eat and drink.  How much sleep you get.  Any change to your diet or medicines.  Lessen how much alcohol you drink.  Quit smoking if you smoke.  Get enough sleep.  Lessen any stress in your life.  Keep lights dim if bright lights bother you or make your migraines worse. GET HELP RIGHT AWAY IF:   Your migraine becomes really bad.  You have a fever.  You have a stiff neck.  You have trouble seeing.  Your muscles are weak, or you lose muscle control.  You lose your balance or have trouble walking.  You feel like you will pass out (faint), or you pass out.  You have really bad symptoms that are different than your first symptoms. MAKE SURE YOU:   Understand these instructions.  Will watch your condition.  Will get help right away if you are not doing well or get worse. Document Released: 07/14/2008 Document Revised: 12/28/2011 Document Reviewed: 06/12/2013 [facility] Patient Information 2015 [place], [place]. This information is not intended to replace advice given to you by your health care provider. Make sure you discuss any questions you have with your health care provider.  Emergency Department Resource Guide 1) Find a Doctor and Pay Out of Pocket Although you won't have to find  out who is covered by your insurance plan, it is a good idea to ask around and get recommendations. You will then need to call the office and see if the doctor you have chosen will accept you as a new patient and what types of options they offer for patients who are self-pay. Some doctors offer discounts or will set up payment plans for their patients who do not have insurance, but you will need to ask so you aren't surprised when you get to your appointment.  2) Contact Your Local Health Department Not all health departments have doctors that can see patients for sick visits, but many do, so it is worth a call to see if yours does. If you don't know where your local health department is, you can check in your phone book. The CDC also has a tool to help you locate your state's health department, and many state websites also have listings of all of their local health departments.  3) Find a Walk-in Clinic If your illness is not likely to be very severe or complicated, you may want to try a walk in clinic. These are popping up all over the country in pharmacies, drugstores, and shopping centers. They're usually staffed by nurse practitioners or physician assistants that have been trained to treat common illnesses and complaints. They're usually fairly quick and inexpensive. However, if you have serious medical issues or chronic medical problems, these are probably not your best option.  No Primary Care Doctor: - Call Health Connect at  [phone] - they can help you locate a primary care doctor that  accepts your insurance, provides certain services, etc. - Physician Referral Service- [phone]  Chronic Pain Problems: Organization         Address  Phone   Notes  [facility]  [phone] Patients need to be referred by their primary care doctor.   Medication Assistance: Organization         Address  Phone   Notes  [facility] Med[facility] [address]  [place] [place], [place] [address] [phone] --Must be a resident of [facility] -- Must have NO insurance coverage whatsoever (no Medicaid/ Medicare, etc.) -- The pt. MUST have a primary care doctor that directs their care regularly and follows them in the community   MedAssist  [phone]   [employer]  [phone]    Agencies that provide inexpensive medical care: Organization         Address  Phone   Notes  [facility]  [phone]   [facility]    [phone]   [facility] [address] [place], [place] [address] [phone]   [facility] [address] [place] [place] [phone]   Planned Parenthood    [phone]   [facility]    [phone]   [facility]  [address], [place] Phone:  [phone], Fax:  [phone] Hours of Operation:  9 am - 6 pm, M-F.  Also accepts Medicaid/Medicare and self-pay.  [facility]  [address], [place] Phone: [phone], Fax: [phone]. Hours of Operation:  8:30 am - 5:30 pm, M-F.  Also accepts Medicaid and self-pay.  [facility] [place] [address], [place] Phone: [phone]   [facility] [address] Natasha Bence [place], [place] [phone], Ext. [phone] Mondays & Thursdays: 7-9 AM.  First 15 patients are seen on a first come, first serve basis.    Medicaid-accepting [facility] Providers:  Organization         Address  Phone   Notes  [facility] [address], [place] [phone] Also accepts self-pay patients.  [facility] [address] Laurell Josephs [place], [place]  [phone]   [facility] [address], [place] [phone]   [facility] [address], [place] [phone]   Renaye Rakers  [address], [place]   [phone] Only accepts [place] Access [place] patients after they have their name applied to their card.   Self-Pay (no insurance) in [facility]:  Organization         Address  Phone   Notes  Sickle Cell Patients, [facility] [address] [place], [place] [phone]   [facility] [address], [place] [phone]   [facility]  [address], [place] [phone]   [facility]/Dr. Osei-Bonsu  [address] [place] or [address], [place] [phone] Phone number for both [place] and [place] locations is the same.  [facility] [address] Ginette Otto [phone]  [facility] [address] [place] or [address] [place] [address] [phone]   [facility] [address] [place], [place] [phone], phone; [phone], fax Sees patients 1st and 3rd Saturday of every month.  Must not qualify for public or private insurance (i.e. Medicaid, Medicare, [place] Health Choice, Veterans' Benefits)  Household income should be no more than 200% of the poverty level The clinic cannot treat you if you are pregnant or think you are pregnant  Sexually transmitted diseases are not treated at the clinic.    Dental Care: Organization         Address  Phone  Notes  [facility] [address] [place], [place] [phone] Accepts children up to age 56 who are enrolled in [place] or [place] Health Choice; pregnant women with a Medicaid card; and children who have applied for Medicaid or [place] Health Choice, but were declined, whose parents can pay a reduced fee at time of service.  [facility] Department of [facility]  [address], [place] [phone] Accepts children up to age 10  who are enrolled in [place] or [place] Health Choice; pregnant women with a Medicaid card; and children who have applied for Medicaid or [place] Health Choice, but were declined, whose parents can pay a reduced fee at time of service.  [facility]  [address] [place], [place] [phone] Patients are seen by appointment only. Walk-ins are not accepted. [facility] will see patients 4 years of age and older. Monday - Tuesday (8am-5pm) Most Wednesdays (8:30-5pm) $30 per visit, cash only  [facility] Adult Dental Access PROGRAM  [address], [facility] [phone] Patients are seen by appointment only. Walk-ins are not accepted. [facility] will see patients 7 years of age and older. One Wednesday Evening (Monthly: Volunteer Based).  $30 per visit, cash only  [employer]  [phone] for adults; Children under age 83, call Graduate Pediatric Dentistry at [phone]. Children aged 3-14, please call [phone] to request a pediatric application.  Dental services are provided in all areas of dental care including fillings, crowns and bridges, complete and partial dentures, implants, gum treatment, root canals, and extractions. Preventive care is also provided. Treatment is provided to both adults and children. Patients are selected via a lottery and there is often a waiting list.   [facility] [address] [place]  [phone] [email or web address]   [facility] [address] [place], [place] [phone], Ext. [phone] Second and Fourth Thursday of each month, opens at 6:30 AM; Clinic ends at 9 AM.  Patients are seen on a first-come first-served basis, and a limited number are seen during each clinic.   [facility]  [address] Ether Griffins [place], [place] [phone]   Eligibility Requirements You must have lived in [place], [place], or [place] counties for at least the last three months.    You cannot be eligible for state or federal sponsored [place], including [employer], [place], or [employer].   You generally cannot be eligible for healthcare insurance through your employer.    How to apply: Eligibility screenings are held every Tuesday and Wednesday afternoon from 1:00 pm until 4:00 pm. You do not need an appointment for the interview!  [facility] [address], [place], [place] [phone]  [place] Department  [place]  [phone]    Behavioral Health Resources in the Community: Intensive Outpatient Programs Organization         Address  Phone  Notes  [place]. [address] [place], [place] [phone]   [facility] Outpatient [address] [place], [place]   ADS: Alcohol [facility] Drug [facility] [address] [place], [place]   [place] [address]  [place], [place] or [phone]   Substance Abuse Resources Organization         Address  Phone  Notes  Alcohol and Drug Services  [phone]   [place]  [phone]   [facility] [place]   Chinita Pester  [phone]   Residential & Outpatient Substance Abuse Program  [phone]   Psychological Services Organization         Address  Phone  Notes  [facility]  [place]  [phone]   [place] [address], [place] or [phone]    Mobile Crisis Teams Organization         Address  Phone  Notes  [facility], Mobile Crisis Care Unit  [phone]   [facility]  [address] [place] [address] [place], [place]   [place] [address], [place] [phone]    Self-Help/Support  Groups Organization         Address  Phone             Notes  [place]. of [place] - variety of support groups  [place] Call for more information  Narcotics Anonymous (NA), [facility] [address], [employer] [place]  2 meetings at this location   Special educational needs teacher         Address  Phone  Notes  [facility] Residential Treatment [place],    [place]  [phone]   [facility]  [address], [place] [address], [place], [place]   [place], [place] [phone] Admissions: 8am-3pm M-F  Incentives Substance [place] [address]    [place], [place] [phone]   [facility] [address] [place], [place], [place]   [facility] [address],  [place], [place]   [facility] - Intensive Outpatient [address] Kristeen [address], [place], [place]   [facility] [place]  [place], [place] [phone] or [phone]   [facility] ([facility]) [address], [place], [place] Accepts Medicaid  [facility] [address]  [place] [phone] Substance Abuse/Addiction Treatment   [facility] Organization         Address  Phone  Notes  [facility]  [phone]   Domenic Schwab, PhD [address] Arlis Porta [place], [place]   [phone] or [phone]   [place] Dunn [employer], [place] [phone]   [place] [address], [place], [place] [phone] Insurance/Medicaid/sponsorship through [employer] and Families [address], [address]  [place], [place] [phone] [place], [place] [phone]    Dr. Adele Schilder  [phone]   [facility] Dept. 1) [address], [place] 2) [place] 3)  [address], [place] [phone]  [phone]   [place] [phone] or [phone] (After Hours)

## 2015-04-07 NOTE — ED Notes (Signed)
Pt c/o migraine x 2 hours with photophobia and nv.

## 2016-05-13 ENCOUNTER — Emergency Department (HOSPITAL_COMMUNITY): Payer: Self-pay

## 2016-05-13 ENCOUNTER — Emergency Department (HOSPITAL_COMMUNITY)
Admission: EM | Admit: 2016-05-13 | Discharge: 2016-05-13 | Disposition: A | Discharge status: Home / Self Care | Payer: Self-pay | Attending: Emergency Medicine | Admitting: Emergency Medicine

## 2016-05-13 ENCOUNTER — Encounter (HOSPITAL_COMMUNITY): Payer: Self-pay

## 2016-05-13 DIAGNOSIS — R519 Headache, unspecified: Secondary | ICD-10-CM

## 2016-05-13 DIAGNOSIS — F17210 Nicotine dependence, cigarettes, uncomplicated: Secondary | ICD-10-CM | POA: Insufficient documentation

## 2016-05-13 DIAGNOSIS — G43909 Migraine, unspecified, not intractable, without status migrainosus: Secondary | ICD-10-CM | POA: Insufficient documentation

## 2016-05-13 DIAGNOSIS — Z7982 Long term (current) use of aspirin: Secondary | ICD-10-CM | POA: Insufficient documentation

## 2016-05-13 DIAGNOSIS — F329 Major depressive disorder, single episode, unspecified: Secondary | ICD-10-CM | POA: Insufficient documentation

## 2016-05-13 DIAGNOSIS — R51 Headache: Secondary | ICD-10-CM

## 2016-05-13 MED ORDER — KETOROLAC TROMETHAMINE 30 MG/ML IJ SOLN: 30.0000 mg | Freq: Once | INTRAMUSCULAR | Status: DC

## 2016-05-13 MED ORDER — BUTALBITAL-APAP-CAFFEINE 50-325-40 MG PO TABS: 1.0000 | ORAL_TABLET | Freq: Four times a day (QID) | ORAL | 0 refills | Status: AC | PRN

## 2016-05-13 MED ORDER — SODIUM CHLORIDE 0.9 % IV BOLUS (SEPSIS)
1000.0000 mL | Freq: Once | INTRAVENOUS | Status: AC
  Administered 2016-05-13: 1000 mL via INTRAVENOUS

## 2016-05-13 MED ORDER — DIPHENHYDRAMINE HCL 50 MG/ML IJ SOLN
25.0000 mg | Freq: Once | INTRAMUSCULAR | Status: AC
  Administered 2016-05-13: 25 mg via INTRAVENOUS
  Filled 2016-05-13: qty 1

## 2016-05-13 MED ORDER — METOCLOPRAMIDE HCL 5 MG/ML IJ SOLN
10.0000 mg | Freq: Once | INTRAMUSCULAR | Status: AC
  Administered 2016-05-13: 10 mg via INTRAVENOUS
  Filled 2016-05-13: qty 2

## 2016-05-13 NOTE — ED Triage Notes (Signed)
Pt complains of a migraine with hot and cold flashes since last night

## 2016-05-13 NOTE — ED Provider Notes (Signed)
[facility] Provider Note   CSN: [id number] Arrival date & time: 05/13/16  6837  First Provider Contact:  None       History   Chief Complaint Chief Complaint  Patient presents with  . Migraine    HPI LUMINA GIANNUZZI is a 35 y.o. female.  HPI   35 year old female presenting with complaint of headache. Patient reports she has a history of migraine   but hadn't had one for at least 6 months. She is here today with acute onset of throbbing left-sided headache with associated hot flash, chills, light sensitivity, which started at 3 AM today and woke her up from sleep. Headache was initially intense but has since improved. She currently rates her headaches as 5 out of 10. Headache feels very similar to prior migraine headaches that she had multiple times in the past. She noticed that sitting up seems to make the pain better and laying flat makes it worse. She denies having fever, URI symptoms, nausea, vomiting, neck pain, focal numbness or weakness, or rash. She attributed her headache to be in hot at home since her condition isn't working appropriately. She currently denies any vision changes or flashes in her vision.   Past Medical History:  Diagnosis Date  . Anemia   . Anxiety   . Depression   . Gestational diabetes   . Obesity   . Polycystic ovarian syndrome   . Reflux   . Ulcer     Patient Active Problem List   Diagnosis Date Noted  . Abscess of axilla, right 10/10/2013  . Hydradenitis 10/08/2013  . Cellulitis of axilla, right 10/08/2013  . Cellulitis 10/08/2013  . Stricture and stenosis of esophagus 12/31/2011  . Esophageal reflux 11/24/2011  . Anemia 11/24/2011  . POLYCYSTIC OVARY 12/16/2006  . OBESITY, NOS 12/16/2006  . TOBACCO DEPENDENCE 12/16/2006    Past Surgical History:  Procedure Laterality Date  . CESAREAN SECTION     x4  . IRRIGATION AND DEBRIDEMENT ABSCESS Right 10/09/2013   Procedure: IRRIGATION AND DEBRIDEMENT RIGHT AXILLARY ABSCESS;   Surgeon: Kandis Cocking, MD;  Location: [facility];  Service: General;  Laterality: Right;  . TUBAL LIGATION      OB History    Gravida Para Term Preterm AB Living   4 4 4  0 0 4   SAB TAB Ectopic Multiple Live Births   0 0 0 0         Home Medications    Prior to Admission medications   Medication Sig Start Date End Date Taking? Authorizing Provider  Aspirin-Salicylamide-Caffeine (BC HEADACHE POWDER PO) Take 1 each by mouth as needed (headache).   Yes Historical Provider, MD  ibuprofen (ADVIL,MOTRIN) 200 MG tablet Take 600 mg by mouth every 6 (six) hours as needed for headache.   Yes Historical Provider, MD  butalbital-acetaminophen-caffeine (FIORICET) 50-325-40 MG per tablet Take 1 tablet by mouth every 6 (six) hours as needed for headache. Patient not taking: Reported on 05/13/2016 04/07/15   Joni Reining Pisciotta, PA-C  clindamycin (CLEOCIN) 150 MG capsule Take 1 capsule (150 mg total) by mouth every 6 (six) hours. Patient not taking: Reported on 01/11/2015 10/21/14   Marlon Pel, PA-C  gabapentin (NEURONTIN) 300 MG capsule Take 1 capsule (300 mg total) by mouth at bedtime. Start 300 mg by mouth daily before bedtime for 1 week, can increase to 2 pills before bed on day 8 if needed. Patient not taking: Reported on 05/13/2016 04/07/15   Wynetta Emery, PA-C  HYDROcodone-acetaminophen (NORCO/VICODIN)  5-325 MG per tablet Take 2 tablets by mouth every 4 (four) hours as needed. Patient not taking: Reported on 04/07/2015 01/11/15   Rolland Porter, MD  HYDROcodone-ibuprofen (VICOPROFEN) 7.5-200 MG per tablet Take 1 tablet by mouth every 6 (six) hours as needed for moderate pain. Patient not taking: Reported on 01/11/2015 10/21/14   Marlon Pel, PA-C  ibuprofen (ADVIL,MOTRIN) 600 MG tablet Take 1 tablet (600 mg total) by mouth every 6 (six) hours as needed for mild pain or moderate pain. Patient not taking: Reported on 01/11/2015 12/03/14   Loren Racer, MD  LORazepam (ATIVAN) 1 MG tablet Take 1 tablet (1  mg total) by mouth every 8 (eight) hours as needed for anxiety. Patient not taking: Reported on 01/11/2015 12/03/14   Loren Racer, MD  metoCLOPramide (REGLAN) 10 MG tablet Take 1 tablet (10 mg total) by mouth every 8 (eight) hours as needed for nausea (nausea/headache). Patient not taking: Reported on 01/11/2015 12/03/14   Loren Racer, MD  omeprazole (PRILOSEC) 20 MG capsule Take 1 capsule (20 mg total) by mouth 2 (two) times daily. Patient not taking: Reported on 04/07/2015 01/11/15   Rolland Porter, MD  ondansetron (ZOFRAN ODT) 4 MG disintegrating tablet Take 1 tablet (4 mg total) by mouth every 8 (eight) hours as needed for nausea. Patient not taking: Reported on 04/07/2015 01/11/15   Rolland Porter, MD  ondansetron (ZOFRAN) 4 MG tablet Take 1 tablet (4 mg total) by mouth every 6 (six) hours. Patient not taking: Reported on 01/11/2015 10/21/14   Marlon Pel, PA-C  sucralfate (CARAFATE) 1 G tablet Take 1 tablet (1 g total) by mouth 4 (four) times daily. Patient not taking: Reported on 04/07/2015 01/11/15   Rolland Porter, MD    Family History Family History  Problem Relation Age of Onset  . Throat cancer Maternal Aunt   . Cirrhosis Mother   . Diabetes Paternal Grandmother     Social History Social History  Substance Use Topics  . Smoking status: Current Every Day Smoker    Packs/day: 0.25    Years: 19.00    Types: Cigarettes  . Smokeless tobacco: Never Used  . Alcohol use No     Allergies   Latex   Review of Systems Review of Systems  All other systems reviewed and are negative.    Physical Exam Updated Vital Signs BP 129/80 (BP Location: Left Arm)   Pulse 77   Temp 98.2 F (36.8 C) (Oral)   Resp 20   LMP 04/22/2016   SpO2 100%   Physical Exam  Constitutional: She appears well-developed and well-nourished. No distress.  African-American female laying in bed with a blanket over her head. She appears uncomfortable but nontoxic.  HENT:  Head: Atraumatic.  Right Ear:  External ear normal.  Left Ear: External ear normal.  Nose: Nose normal.  Mouth/Throat: Oropharynx is clear and moist.  Eyes: Conjunctivae and EOM are normal. Pupils are equal, round, and reactive to light.  Neck: Normal range of motion. Neck supple.  No nuchal rigidity  Cardiovascular: Normal rate and regular rhythm.   Pulmonary/Chest: Effort normal and breath sounds normal.  Abdominal: Soft. There is no tenderness.  Musculoskeletal: Normal range of motion.  Neurological: She is alert. She displays normal reflexes. No cranial nerve deficit. She exhibits normal muscle tone. GCS eye subscore is 4. GCS verbal subscore is 5. GCS motor subscore is 6.  Skin: No rash noted.  Psychiatric: She has a normal mood and affect.  Nursing note and vitals reviewed.  ED Treatments / Results  Labs (all labs ordered are listed, but only abnormal results are displayed) Labs Reviewed - No data to display  EKG  EKG Interpretation None       Radiology Ct Head Wo Contrast  Result Date: 05/13/2016 CLINICAL DATA:  Acute onset with migraine-type headache with nausea, vomiting, and blurred vision EXAM: CT HEAD WITHOUT CONTRAST TECHNIQUE: Contiguous axial images were obtained from the base of the skull through the vertex without intravenous contrast. COMPARISON:  Report of prior study June 22, 2003; images from that study cannot be retrieved. FINDINGS: Brain: The ventricles are normal in size and configuration. There is a small cavum septum pellucidum, an anatomic variant. There is no intracranial mass, hemorrhage, extra-axial fluid collection, or midline shift. Gray-white compartments are normal. No acute infarct evident. Vascular: There is no demonstrable hyperdense vessel. There is no appreciable vascular calcification. Skull: The bony calvarium appears intact. Sinuses/Orbits: Visualized paranasal sinuses are clear. Note that the frontal sinuses are aplastic. Visualized orbits appear symmetric  bilaterally. Other: Mastoid air cells are clear. IMPRESSION: Study within normal limits.  Note aplastic frontal sinuses. Electronically Signed   By: Bretta Bang III M.D.   On: 05/13/2016 07:49   Procedures Procedures (including critical care time)  Medications Ordered in ED Medications - No data to display   Initial Impression / Assessment and Plan / ED Course  I have reviewed the triage vital signs and the nursing notes.  Pertinent labs & imaging results that were available during my care of the patient were reviewed by me and considered in my medical decision making (see chart for details).  Clinical Course    BP 117/69   Pulse 81   Temp 98.2 F (36.8 C) (Oral)   Resp 16   LMP 04/22/2016 (Exact Date)   SpO2 99%    Final Clinical Impressions(s) / ED Diagnoses   Final diagnoses:  Bad headache    New Prescriptions New Prescriptions   No medications on file   6:55 AM Patient here with acute onset of left-sided headache which started 3 hours ago. Headache is described as a simple migraine headache that she has had some time in the past. Due to the maximal initial onset of her headache several hrs ago, I will obtain a head CT to r/o SAH, although my suspicion is low as pt has had similar headache like this in the past.  I have low suspicion for meningitis as patient has no evidence of nuchal rigidity. No focal neuro deficit to suggest stroke. Migraine cocktail given. Will monitor closely.   Pt felt better after treatment.  Comfortable going home.  Return precaution discussed.    Fayrene Helper, PA-C 05/15/16 0606    Tilden Fossa, MD 05/15/16 [phone]

## 2017-01-01 ENCOUNTER — Encounter (HOSPITAL_COMMUNITY): Payer: Self-pay | Admitting: Emergency Medicine

## 2017-01-01 ENCOUNTER — Emergency Department (HOSPITAL_COMMUNITY)
Admission: EM | Admit: 2017-01-01 | Discharge: 2017-01-01 | Disposition: A | Discharge status: Home / Self Care | Payer: Medicaid Other | Attending: Emergency Medicine | Admitting: Emergency Medicine

## 2017-01-01 DIAGNOSIS — F17210 Nicotine dependence, cigarettes, uncomplicated: Secondary | ICD-10-CM | POA: Insufficient documentation

## 2017-01-01 DIAGNOSIS — Z7982 Long term (current) use of aspirin: Secondary | ICD-10-CM | POA: Insufficient documentation

## 2017-01-01 DIAGNOSIS — Z91040 Latex allergy status: Secondary | ICD-10-CM | POA: Insufficient documentation

## 2017-01-01 DIAGNOSIS — K0889 Other specified disorders of teeth and supporting structures: Secondary | ICD-10-CM

## 2017-01-01 MED ORDER — TRAMADOL HCL 50 MG PO TABS: 50.0000 mg | ORAL_TABLET | Freq: Four times a day (QID) | ORAL | 0 refills | Status: DC | PRN

## 2017-01-01 MED ORDER — PENICILLIN V POTASSIUM 500 MG PO TABS: 500.0000 mg | ORAL_TABLET | Freq: Four times a day (QID) | ORAL | 0 refills | Status: AC

## 2017-01-01 NOTE — ED Triage Notes (Signed)
C/o L upper dental pain since this morning.  Denies fever.

## 2017-01-01 NOTE — Discharge Instructions (Signed)
Follow up with dentist to have tooth evaluated Take antibiotic for the next week Take Ibuprofen/Tylenol for pain

## 2017-01-01 NOTE — ED Provider Notes (Signed)
[facility] Provider Note   CSN: [id number] Arrival date & time: 01/01/17  2115   By signing my name below, I, Soijett Blue, attest that this documentation has been prepared under the direction and in the presence of Bethel BornKelly Marie Kharisma Glasner, PA-C Electronically Signed: Soijett Blue, ED Scribe. 01/01/17. 11:16 PM.  History   Chief Complaint Chief Complaint  Patient presents with  . Dental Pain    HPI Stacie Carrillo is a 36 y.o. female with a PMHx of DM, who presents to the Emergency Department complaining of left upper dental pain onset this morning. Pt has tried aleve, ibuprofen, and warm compresses with mild relief of her symptoms. Pt left upper dental pain is worsened with breathing in. She thinks that she had an old dental filling that broke 1 week ago prior to the onset of her symptoms. Denies having a dentist at this time. She denies fever, chills, drainage, trouble swallowing, and any other symptoms.    The history is provided by the patient and a significant other. No language interpreter was used.    Past Medical History:  Diagnosis Date  . Anemia   . Anxiety   . Depression   . Gestational diabetes   . Obesity   . Polycystic ovarian syndrome   . Reflux   . Ulcer [facility])     Patient Active Problem List   Diagnosis Date Noted  . Abscess of axilla, right 10/10/2013  . Hydradenitis 10/08/2013  . Cellulitis of axilla, right 10/08/2013  . Cellulitis 10/08/2013  . Stricture and stenosis of esophagus 12/31/2011  . Esophageal reflux 11/24/2011  . Anemia 11/24/2011  . POLYCYSTIC OVARY 12/16/2006  . OBESITY, NOS 12/16/2006  . TOBACCO DEPENDENCE 12/16/2006    Past Surgical History:  Procedure Laterality Date  . CESAREAN SECTION     x4  . IRRIGATION AND DEBRIDEMENT ABSCESS Right 10/09/2013   Procedure: IRRIGATION AND DEBRIDEMENT RIGHT AXILLARY ABSCESS;  Surgeon: Kandis Cockingavid H Newman, MD;  Location: [facility];  Service: General;  Laterality: Right;  . TUBAL LIGATION       OB History    Gravida Para Term Preterm AB Living   4 4 4  0 0 4   SAB TAB Ectopic Multiple Live Births   0 0 0 0 1       Home Medications    Prior to Admission medications   Medication Sig Start Date End Date Taking? Authorizing Provider  Aspirin-Salicylamide-Caffeine (BC HEADACHE POWDER PO) Take 1 each by mouth as needed (headache).    Historical Provider, MD  butalbital-acetaminophen-caffeine (FIORICET) [phone] MG tablet Take 1-2 tablets by mouth every 6 (six) hours as needed for headache. 05/13/16 05/13/17  Fayrene HelperBowie Tran, PA-C  ibuprofen (ADVIL,MOTRIN) 200 MG tablet Take 600 mg by mouth every 6 (six) hours as needed for headache.    Historical Provider, MD    Family History Family History  Problem Relation Age of Onset  . Throat cancer Maternal Aunt   . Cirrhosis Mother   . Diabetes Paternal Grandmother     Social History Social History  Substance Use Topics  . Smoking status: Current Every Day Smoker    Packs/day: 0.25    Years: 19.00    Types: Cigarettes  . Smokeless tobacco: Never Used  . Alcohol use No     Allergies   Latex   Review of Systems Review of Systems  Constitutional: Negative for chills and fever.  HENT: Positive for dental problem (left upper). Negative for facial swelling and trouble swallowing.  Physical Exam Updated Vital Signs BP 121/69 (BP Location: Left Arm)   Pulse 78   Temp 98.9 F (37.2 C) (Oral)   Resp (!) 22   LMP 12/16/2016   SpO2 99%   Physical Exam  Constitutional: She is oriented to person, place, and time. She appears well-developed and well-nourished. No distress.  HENT:  Head: Normocephalic and atraumatic.  Mouth/Throat: Uvula is midline, oropharynx is clear and moist and mucous membranes are normal. No trismus in the jaw. Abnormal dentition. No uvula swelling.  Left upper tooth #12 is decayed and very TTP. No trismus. No swelling. No drainable abscess. Pt handling secretions.  Eyes: EOM are normal.  Neck:  Neck supple.  Cardiovascular: Normal rate.   Pulmonary/Chest: Effort normal. No respiratory distress.  Abdominal: She exhibits no distension.  Musculoskeletal: Normal range of motion.  Neurological: She is alert and oriented to person, place, and time.  Skin: Skin is warm and dry.  Psychiatric: She has a normal mood and affect. Her behavior is normal.  Nursing note and vitals reviewed.    ED Treatments / Results  DIAGNOSTIC STUDIES: Oxygen Saturation is 99% on RA, nl by my interpretation.    COORDINATION OF CARE: 10:57 PM Discussed treatment plan with pt at bedside which includes dental block, ax Rx, follow up with dentist, and pt agreed to plan.  Procedures Dental Block Date/Time: 01/01/2017 11:08 PM Performed by: Terance Hart MARIE Authorized by: Terance Hart MARIE   Consent:    Consent obtained:  Verbal   Consent given by:  Patient   Risks discussed:  Pain and unsuccessful block   Alternatives discussed:  Alternative treatment Indications:    Indications: dental pain   Location:    Block type:  Supraperiosteal   Supraperiosteal location:  Upper teeth   Upper teeth location:  12/LU 1st bicuspid Procedure details (see MAR for exact dosages):    Syringe type:  Controlled syringe   Needle gauge:  27 G   Anesthetic injected:  Bupivacaine 0.5% WITH epi (1.8 ml used)   Injection procedure:  Anatomic landmarks identified and anatomic landmarks palpated Post-procedure details:    Outcome:  Anesthesia achieved   Patient tolerance of procedure:  Tolerated well, no immediate complications    (including critical care time)  Medications Ordered in ED Medications - No data to display   Initial Impression / Assessment and Plan / ED Course  I have reviewed the triage vital signs and the nursing notes.  Patient with dentalgia.  No abscess requiring immediate incision and drainage.  Exam not concerning for Ludwig's angina or pharyngeal abscess.  Will treat with dental block in  the ED and veetid and tramadol Rx. Resource guide given. Pt instructed to follow-up with dentist. Discussed return precautions. Pt safe for discharge.  Final Clinical Impressions(s) / ED Diagnoses   Final diagnoses:  Pain, dental    New Prescriptions Discharge Medication List as of 01/01/2017 11:22 PM    START taking these medications   Details  penicillin v potassium (VEETID) 500 MG tablet Take 1 tablet (500 mg total) by mouth 4 (four) times daily., Starting Fri 01/01/2017, Until Fri 01/08/2017, Print       I personally performed the services described in this documentation, which was scribed in my presence. The recorded information has been reviewed and is accurate.     [place] Born, PA-C 01/02/17 1626    Marily Memos, MD 01/04/17 [phone]

## 2017-08-31 ENCOUNTER — Ambulatory Visit (HOSPITAL_COMMUNITY)
Admission: EM | Admit: 2017-08-31 | Discharge: 2017-08-31 | Disposition: A | Discharge status: Home / Self Care | Payer: BLUE CROSS/BLUE SHIELD | Attending: Emergency Medicine | Admitting: Emergency Medicine

## 2017-08-31 ENCOUNTER — Other Ambulatory Visit: Payer: Self-pay

## 2017-08-31 ENCOUNTER — Encounter (HOSPITAL_COMMUNITY): Payer: Self-pay

## 2017-08-31 DIAGNOSIS — M79672 Pain in left foot: Secondary | ICD-10-CM

## 2017-08-31 MED ORDER — NAPROXEN 500 MG PO TABS: 500.0000 mg | ORAL_TABLET | Freq: Two times a day (BID) | ORAL | 0 refills | Status: DC

## 2017-08-31 NOTE — ED Triage Notes (Signed)
[facility] with complaints of sharp shooting pain in left foot x2 months, pt denies any injuries and does not have a primary physician at this time

## 2017-08-31 NOTE — Discharge Instructions (Signed)
It appears that you may have a bone spur to lt heal of foot  Follow up with ortho for further tests  Wear good support shoes  Take pain meds as needed  May use heat and ice for swelling

## 2017-08-31 NOTE — ED Provider Notes (Signed)
[facility]    CSN: [id number] Arrival date & time: 08/31/17  1829     History   Chief Complaint Chief Complaint  Patient presents with  . Foot Pain    left     HPI Stacie Carrillo is a 36 y.o. female.   Pt is here for lt heal pain x2 weeks . States that she was looking up things and it appears to be a bone spur. Pt states that it only hurts when she bears wt to heal of foot or wears flat shoes. Denies any injury. Minimal swelling. Has not taken anything pta.       Past Medical History:  Diagnosis Date  . Anemia   . Anxiety   . Depression   . Gestational diabetes   . Obesity   . Polycystic ovarian syndrome   . Reflux   . Ulcer     Patient Active Problem List   Diagnosis Date Noted  . Abscess of axilla, right 10/10/2013  . Hydradenitis 10/08/2013  . Cellulitis of axilla, right 10/08/2013  . Cellulitis 10/08/2013  . Stricture and stenosis of esophagus 12/31/2011  . Esophageal reflux 11/24/2011  . Anemia 11/24/2011  . POLYCYSTIC OVARY 12/16/2006  . OBESITY, NOS 12/16/2006  . TOBACCO DEPENDENCE 12/16/2006    Past Surgical History:  Procedure Laterality Date  . CESAREAN SECTION     x4  . TUBAL LIGATION      OB History    Gravida Para Term Preterm AB Living   4 4 4  0 0 4   SAB TAB Ectopic Multiple Live Births   0 0 0 0 1       Home Medications    Prior to Admission medications   Medication Sig Start Date End Date Taking? Authorizing Provider  ibuprofen (ADVIL,MOTRIN) 200 MG tablet Take 600 mg by mouth every 6 (six) hours as needed for headache.   Yes [provider]  Aspirin-Salicylamide-Caffeine (BC HEADACHE POWDER PO) Take 1 each by mouth as needed (headache).    [provider]  naproxen (NAPROSYN) 500 MG tablet Take 1 tablet (500 mg total) 2 (two) times daily by mouth. 08/31/17   Coralyn MarkMitchell, Ravinder Lukehart L, NP  traMADol (ULTRAM) 50 MG tablet Take 1 tablet (50 mg total) by mouth every 6 (six) hours as needed. 01/01/17    Bethel BornGekas, Kelly Marie, PA-C    Family History Family History  Problem Relation Age of Onset  . Throat cancer Maternal Aunt   . Cirrhosis Mother   . Diabetes Paternal Grandmother     Social History Social History   Tobacco Use  . Smoking status: Current Every Day Smoker    Packs/day: 0.25    Years: 19.00    Pack years: 4.75    Types: Cigarettes  . Smokeless tobacco: Never Used  Substance Use Topics  . Alcohol use: No  . Drug use: No     Allergies   Latex   Review of Systems Review of Systems  Constitutional: Negative.   Respiratory: Negative.   Cardiovascular: Negative.   Musculoskeletal: Positive for joint swelling.       Lt heal pain with wt bearing   Skin: Negative.   Neurological: Negative.      Physical Exam Triage Vital Signs ED Triage Vitals  Enc Vitals Group     BP 08/31/17 1843 105/74     Pulse Rate 08/31/17 1843 79     Resp 08/31/17 1843 16     Temp 08/31/17 1843 97.7  F (36.5 C)     Temp Source 08/31/17 1843 Oral     SpO2 08/31/17 1843 98 %     Weight --      Height --      Head Circumference --      Peak Flow --      Pain Score 08/31/17 1844 8     Pain Loc --      Pain Edu? --      Excl. in GC? --    No data found.  Updated Vital Signs BP 105/74 (BP Location: Left Arm)   Pulse 79   Temp 97.7 F (36.5 C) (Oral)   Resp 16   LMP 08/15/2017 (Exact Date)   SpO2 98%   Visual Acuity     Physical Exam  Constitutional: She appears well-developed.  Cardiovascular: Normal rate and regular rhythm.  Pulmonary/Chest: Effort normal and breath sounds normal.  Musculoskeletal: She exhibits edema and tenderness.  Slight amount of edema noted to heal area and pronated area. Pain is located in mid center of heal to lt side. Strong pulses, warm to touch,   Neurological: She is alert.  Skin: Skin is warm. Capillary refill takes less than 2 seconds.     UC Treatments / Results  Labs (all labs ordered are listed, but only abnormal results are  displayed) Labs Reviewed - No data to display  EKG  EKG Interpretation None       Radiology No results found.  Procedures Procedures (including critical care time)  Medications Ordered in UC Medications - No data to display   Initial Impression / Assessment and Plan / UC Course  I have reviewed the triage vital signs and the nursing notes.  Pertinent labs & imaging results that were available during my care of the patient were reviewed by me and considered in my medical decision making (see chart for details).     It appears that you may have a bone spur to lt heal of foot  Follow up with ortho for further tests  Wear good support shoes  Take pain meds as needed  May use heat and ice for swelling   Final Clinical Impressions(s) / UC Diagnoses   Final diagnoses:  Foot pain, left    ED Discharge Orders        Ordered    naproxen (NAPROSYN) 500 MG tablet  2 times daily     08/31/17 1856       Controlled Substance Prescriptions [place] Controlled Substance Registry consulted? Not Applicable   Coralyn MarkMitchell, Lizzie An L, NP 08/31/17 1900

## 2017-11-12 ENCOUNTER — Other Ambulatory Visit: Payer: Self-pay

## 2017-11-12 ENCOUNTER — Encounter (HOSPITAL_COMMUNITY): Payer: Self-pay | Admitting: Emergency Medicine

## 2017-11-12 ENCOUNTER — Ambulatory Visit (HOSPITAL_COMMUNITY)
Admission: EM | Admit: 2017-11-12 | Discharge: 2017-11-12 | Disposition: A | Discharge status: Home / Self Care | Payer: BLUE CROSS/BLUE SHIELD | Attending: Family Medicine | Admitting: Family Medicine

## 2017-11-12 DIAGNOSIS — M25511 Pain in right shoulder: Secondary | ICD-10-CM

## 2017-11-12 DIAGNOSIS — K2950 Unspecified chronic gastritis without bleeding: Secondary | ICD-10-CM | POA: Diagnosis not present

## 2017-11-12 MED ORDER — CYCLOBENZAPRINE HCL 10 MG PO TABS: 10.0000 mg | ORAL_TABLET | Freq: Two times a day (BID) | ORAL | 0 refills | Status: DC | PRN

## 2017-11-12 MED ORDER — KETOROLAC TROMETHAMINE 60 MG/2ML IM SOLN
60.0000 mg | Freq: Once | INTRAMUSCULAR | Status: AC
  Administered 2017-11-12: 60 mg via INTRAMUSCULAR

## 2017-11-12 MED ORDER — KETOROLAC TROMETHAMINE 60 MG/2ML IM SOLN
INTRAMUSCULAR | Status: AC
  Filled 2017-11-12: qty 2

## 2017-11-12 MED ORDER — ONDANSETRON HCL 4 MG PO TABS: 4.0000 mg | ORAL_TABLET | Freq: Four times a day (QID) | ORAL | 0 refills | Status: AC

## 2017-11-12 MED ORDER — GI COCKTAIL ~~LOC~~
30.0000 mL | Freq: Once | ORAL | Status: AC
  Administered 2017-11-12: 30 mL via ORAL

## 2017-11-12 MED ORDER — GI COCKTAIL ~~LOC~~
ORAL | Status: AC
Start: 2017-11-12 — End: ?
  Filled 2017-11-12: qty 30

## 2017-11-12 MED ORDER — OMEPRAZOLE 20 MG PO CPDR: 20.0000 mg | DELAYED_RELEASE_CAPSULE | Freq: Every day | ORAL | 0 refills | Status: DC

## 2017-11-12 NOTE — Discharge Instructions (Signed)
Shoulder pain: Pain is likely from overuse.  Today we gave you a Toradol injection please use sling as needed for comfort.  Take Tylenol as needed for pain you may use Flexeril muscle relaxer at night, this may cause some drowsiness please do not use during the day or drive after use.  I recommend rest aching your shoulder for a few days.  Avoid motions that aggravate pain.  Stomach pain/nausea/reflux: I have sent in Prilosec for you to use as an alternative daily anti-reflux medicine instead of Zantac.  May also use over-the-counter antacids like Maalox, Tums for breakthrough reflux.  Please be sure to have consistent use with this to have an effect.  I have also sent in Zofran for you to use as needed for nausea.  Please follow-up with gastroenterology to further explore H. Pylori.

## 2017-11-12 NOTE — ED Triage Notes (Signed)
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 Repetitive movement on R shoulder causing pain.

## 2017-11-12 NOTE — ED Provider Notes (Signed)
[facility]    CSN: [id number] Arrival date & time: 11/12/17  1621     History   Chief Complaint Chief Complaint  Patient presents with  . Shoulder Pain  . Nausea    HPI Stacie Carrillo is a 37 y.o. female history of GERD and gastric ulcers presenting today with nausea and shoulder pain.  Her main concern is her shoulder which started hurting about 2 weeks ago.  Has been worsening for the past 2-3 days.  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  She is right-handed.  She has had a similar pain in the past but that improved with rest, shoulder exercises.  She has been taking Tylenol.  Due to her ulcers she avoids ibuprofen.  She has been trying heating pads and BenGay, Biofreeze.  She states that recently she has felt more start feeling in her stomach as well as feeling gassy.  States this is very similar to the last time she had H. pylori.  Past EGDs showed prepyloric ulcers.  She has not been back to gastroenterology in a while because she did not have insurance.  She has recently regained insurance.  She denies pain, symptoms are mild mainly nausea.  Denies blood in stool.  HPI  Past Medical History:  Diagnosis Date  . Anemia   . Anxiety   . Depression   . Gestational diabetes   . Obesity   . Polycystic ovarian syndrome   . Reflux   . Ulcer     Patient Active Problem List   Diagnosis Date Noted  . Abscess of axilla, right 10/10/2013  . Hydradenitis 10/08/2013  . Cellulitis of axilla, right 10/08/2013  . Cellulitis 10/08/2013  . Stricture and stenosis of esophagus 12/31/2011  . Esophageal reflux 11/24/2011  . Anemia 11/24/2011  . POLYCYSTIC OVARY 12/16/2006  . OBESITY, NOS 12/16/2006  . TOBACCO DEPENDENCE 12/16/2006    Past Surgical History:  Procedure Laterality Date  . CESAREAN SECTION     x4  . IRRIGATION AND DEBRIDEMENT ABSCESS Right 10/09/2013   Procedure: IRRIGATION AND DEBRIDEMENT RIGHT  AXILLARY ABSCESS;  Surgeon: Kandis Cocking, MD;  Location: [facility];  Service: General;  Laterality: Right;  . TUBAL LIGATION      OB History    Gravida Para Term Preterm AB Living   4 4 4  0 0 4   SAB TAB Ectopic Multiple Live Births   0 0 0 0 1       Home Medications    Prior to Admission medications   Medication Sig Start Date End Date Taking? Authorizing Provider  Aspirin-Salicylamide-Caffeine (BC HEADACHE POWDER PO) Take 1 each by mouth as needed (headache).    [provider]  cyclobenzaprine (FLEXERIL) 10 MG tablet Take 1 tablet (10 mg total) by mouth 2 (two) times daily as needed for muscle spasms. 11/12/17   Bambie Pizzolato C, PA-C  ibuprofen (ADVIL,MOTRIN) 200 MG tablet Take 600 mg by mouth every 6 (six) hours as needed for headache.    [provider]  naproxen (NAPROSYN) 500 MG tablet Take 1 tablet (500 mg total) 2 (two) times daily by mouth. Patient not taking: Reported on 11/12/2017 08/31/17   Coralyn Mark, NP  omeprazole (PRILOSEC) 20 MG capsule Take 1 capsule (20 mg total) by mouth daily. 11/12/17 12/12/17  Seward Coran C, PA-C  ondansetron (ZOFRAN) 4 MG tablet Take 1 tablet (4 mg total) by mouth every  6 (six) hours for 7 days. 11/12/17 11/19/17  Micheil Klaus C, PA-C  traMADol (ULTRAM) 50 MG tablet Take 1 tablet (50 mg total) by mouth every 6 (six) hours as needed. Patient not taking: Reported on 11/12/2017 01/01/17   Bethel BornGekas, Kelly Marie, PA-C  amoxicillin-clarithromycin-lansoprazole [facility](PREVPAC) combo pack Take by mouth 2 (two) times daily. Follow package directions. 12/11/11 12/31/11  Louis MeckelKaplan, Robert D, MD    Family History Family History  Problem Relation Age of Onset  . Throat cancer Maternal Aunt   . Cirrhosis Mother   . Diabetes Paternal Grandmother     Social History Social History   Tobacco Use  . Smoking status: Current Every Day Smoker    Packs/day: 0.25    Years: 19.00    Pack years: 4.75    Types: Cigarettes  . Smokeless tobacco:  Never Used  Substance Use Topics  . Alcohol use: No  . Drug use: No     Allergies   Latex   Review of Systems Review of Systems  Constitutional: Negative for chills and fever.  HENT: Negative for ear pain and sore throat.   Eyes: Negative for pain and visual disturbance.  Respiratory: Negative for cough and shortness of breath.   Cardiovascular: Negative for chest pain and palpitations.  Gastrointestinal: Positive for nausea. Negative for abdominal pain and vomiting.  Genitourinary: Negative for dysuria and hematuria.  Musculoskeletal: Positive for arthralgias, myalgias and neck pain. Negative for back pain.  Skin: Negative for color change and rash.  Neurological: Negative for seizures and syncope.  All other systems reviewed and are negative.    Physical Exam Triage Vital Signs ED Triage Vitals  Enc Vitals Group     BP 11/12/17 1650 (!) 94/53     Pulse Rate 11/12/17 1650 86     Resp 11/12/17 1650 16     Temp 11/12/17 1650 98.5 F (36.9 C)     Temp src --      SpO2 11/12/17 1650 100 %     Weight --      Height --      Head Circumference --      Peak Flow --      Pain Score 11/12/17 1652 5     Pain Loc --      Pain Edu? --      Excl. in GC? --    No data found.  Updated Vital Signs BP (!) 94/53   Pulse 86   Temp 98.5 F (36.9 C)   Resp 16   LMP 10/16/2017   SpO2 100%    Physical Exam  Constitutional: She is oriented to person, place, and time. She appears well-developed and well-nourished. No distress.  HENT:  Head: Normocephalic and atraumatic.  Eyes: Conjunctivae are normal.  Neck: Neck supple.  Nontender to cervical spine.  Mild tenderness to palpation right neck musculature extending towards shoulder  Cardiovascular: Normal rate and regular rhythm.  No murmur heard. Pulmonary/Chest: Effort normal and breath sounds normal. No respiratory distress.  Abdominal: Soft. There is no tenderness.  Nontender to light and deep palpation.    Musculoskeletal: She exhibits no edema.  Right shoulder with full range of motion, nontender to palpation of bony prominences.  Right AC joint does appear slightly elevated compared to left.  Neurological: She is alert and oriented to person, place, and time.  Skin: Skin is warm and dry.  Psychiatric: She has a normal mood and affect.  Nursing note and vitals reviewed.  UC Treatments / Results  Labs (all labs ordered are listed, but only abnormal results are displayed) Labs Reviewed - No data to display  EKG  EKG Interpretation None       Radiology No results found.  Procedures Procedures (including critical care time)  Medications Ordered in UC Medications  gi cocktail (Maalox,Lidocaine,Donnatal) (30 mLs Oral Given 11/12/17 1740)  ketorolac (TORADOL) injection 60 mg (60 mg Intramuscular Given 11/12/17 1740)     Initial Impression / Assessment and Plan / UC Course  I have reviewed the triage vital signs and the nursing notes.  Pertinent labs & imaging results that were available during my care of the patient were reviewed by me and considered in my medical decision making (see chart for details).     GI cocktail and Toradol injection provided in clinic today.  Shoulder pain likely related to overuse injury-imaging deferred due to full range of motion, lack of injury.  Will treat with sling for comfort, Tylenol for pain, may use Flexeril in the evening to also help with neck pain/tightness.  Provided shoulder exercise to strengthen shoulder after initial acute pain subsides.  Will switch from Zantac to Prilosec to see if this is better control over her reflux.  Zofran provided for nausea.  Patient plans to follow-up with gastroenterology, advised that she likely will need another EGD.Discussed strict return precautions. Patient verbalized understanding and is agreeable with plan.   Final Clinical Impressions(s) / UC Diagnoses   Final diagnoses:  Acute pain of right  shoulder  Chronic gastritis without bleeding, unspecified gastritis type    ED Discharge Orders        Ordered    omeprazole (PRILOSEC) 20 MG capsule  Daily    Comments:  Generic okay   11/12/17 1751    ondansetron (ZOFRAN) 4 MG tablet  Every 6 hours     11/12/17 1752    cyclobenzaprine (FLEXERIL) 10 MG tablet  2 times daily PRN     11/12/17 1753       Controlled Substance Prescriptions [place] Controlled Substance Registry consulted? Not Applicable   Lew Dawes, [place] 11/12/17 2055

## 2017-11-12 NOTE — ED Triage Notes (Signed)
Pt also states she feels like she has ulcers in her stomach. Requesting something for nausea.

## 2018-03-07 ENCOUNTER — Inpatient Hospital Stay (HOSPITAL_COMMUNITY)
Admission: AD | Admit: 2018-03-07 | Discharge: 2018-03-07 | Disposition: A | Discharge status: Home / Self Care | Payer: BLUE CROSS/BLUE SHIELD | Source: Ambulatory Visit | Attending: Obstetrics and Gynecology | Admitting: Obstetrics and Gynecology

## 2018-03-07 DIAGNOSIS — R300 Dysuria: Secondary | ICD-10-CM | POA: Diagnosis present

## 2018-03-07 DIAGNOSIS — E669 Obesity, unspecified: Secondary | ICD-10-CM | POA: Diagnosis not present

## 2018-03-07 DIAGNOSIS — F17210 Nicotine dependence, cigarettes, uncomplicated: Secondary | ICD-10-CM | POA: Diagnosis not present

## 2018-03-07 DIAGNOSIS — Z79899 Other long term (current) drug therapy: Secondary | ICD-10-CM | POA: Insufficient documentation

## 2018-03-07 DIAGNOSIS — E282 Polycystic ovarian syndrome: Secondary | ICD-10-CM | POA: Insufficient documentation

## 2018-03-07 DIAGNOSIS — K219 Gastro-esophageal reflux disease without esophagitis: Secondary | ICD-10-CM | POA: Diagnosis not present

## 2018-03-07 DIAGNOSIS — F419 Anxiety disorder, unspecified: Secondary | ICD-10-CM | POA: Diagnosis not present

## 2018-03-07 DIAGNOSIS — N3000 Acute cystitis without hematuria: Secondary | ICD-10-CM | POA: Diagnosis not present

## 2018-03-07 DIAGNOSIS — F329 Major depressive disorder, single episode, unspecified: Secondary | ICD-10-CM | POA: Insufficient documentation

## 2018-03-07 LAB — URINALYSIS, ROUTINE W REFLEX MICROSCOPIC
BILIRUBIN URINE: NEGATIVE
GLUCOSE, UA: NEGATIVE mg/dL
HGB URINE DIPSTICK: NEGATIVE
KETONES UR: NEGATIVE mg/dL
NITRITE: NEGATIVE
Protein, ur: NEGATIVE mg/dL
Specific Gravity, Urine: 1.023 (ref 1.005–1.030)
pH: 5 (ref 5.0–8.0)

## 2018-03-07 LAB — POCT PREGNANCY, URINE: PREG TEST UR: NEGATIVE

## 2018-03-07 MED ORDER — SULFAMETHOXAZOLE-TRIMETHOPRIM 800-160 MG PO TABS: 1.0000 | ORAL_TABLET | Freq: Two times a day (BID) | ORAL | 0 refills | Status: DC

## 2018-03-07 NOTE — MAU Provider Note (Signed)
History     CSN: [id number]  Arrival date and time: 03/07/18 4742   First Provider Initiated Contact with Patient 03/07/18 1741      Chief Complaint  Patient presents with  . Dysuria   HPI Stacie Carrillo is a 37 y.o. non pregnant female who presents with dysuria. Symptoms began a few days ago. Reports burning with urination and increased urinary frequency. Denies flank pain, hematuria, vaginal discharge, vaginal lesions, or fever/chills. States symptoms feel just like previous UTI.  Past Medical History:  Diagnosis Date  . Anemia   . Anxiety   . Depression   . Gestational diabetes   . Obesity   . Polycystic ovarian syndrome   . Reflux   . Ulcer     Past Surgical History:  Procedure Laterality Date  . CESAREAN SECTION     x4  . IRRIGATION AND DEBRIDEMENT ABSCESS Right 10/09/2013   Procedure: IRRIGATION AND DEBRIDEMENT RIGHT AXILLARY ABSCESS;  Surgeon: Kandis Cocking, MD;  Location: [facility];  Service: General;  Laterality: Right;  . TUBAL LIGATION      Family History  Problem Relation Age of Onset  . Throat cancer Maternal Aunt   . Cirrhosis Mother   . Diabetes Paternal Grandmother     Social History   Tobacco Use  . Smoking status: Current Every Day Smoker    Packs/day: 0.25    Years: 19.00    Pack years: 4.75    Types: Cigarettes  . Smokeless tobacco: Never Used  Substance Use Topics  . Alcohol use: No  . Drug use: No    Allergies:  Allergies  Allergen Reactions  . Latex Other (See Comments)    Pt reports "eczema" with latex use    Medications Prior to Admission  Medication Sig Dispense Refill Last Dose  . Aspirin-Salicylamide-Caffeine (BC HEADACHE POWDER PO) Take 1 each by mouth as needed (headache).   Unknown at Unknown time  . cyclobenzaprine (FLEXERIL) 10 MG tablet Take 1 tablet (10 mg total) by mouth 2 (two) times daily as needed for muscle spasms. 20 tablet 0   . ibuprofen (ADVIL,MOTRIN) 200 MG tablet Take 600 mg by mouth every 6 (six)  hours as needed for headache.   08/30/2017 at Unknown time  . naproxen (NAPROSYN) 500 MG tablet Take 1 tablet (500 mg total) 2 (two) times daily by mouth. (Patient not taking: Reported on 11/12/2017) 30 tablet 0 Not Taking at Unknown time  . omeprazole (PRILOSEC) 20 MG capsule Take 1 capsule (20 mg total) by mouth daily. 30 capsule 0   . traMADol (ULTRAM) 50 MG tablet Take 1 tablet (50 mg total) by mouth every 6 (six) hours as needed. (Patient not taking: Reported on 11/12/2017) 10 tablet 0 Not Taking at Unknown time    Review of Systems  Constitutional: Negative.   Gastrointestinal: Negative.   Genitourinary: Positive for dysuria and frequency. Negative for flank pain, genital sores, hematuria, vaginal bleeding and vaginal discharge.   Physical Exam   Blood pressure 124/77, pulse 69, temperature 98.6 F (37 C), temperature source Oral, resp. rate 16, height  (1.651 m), weight 190 lb (86.2 kg), last menstrual period 02/10/2018, SpO2 99 %.  Physical Exam  Nursing note and vitals reviewed. Constitutional: She is oriented to person, place, and time. She appears well-developed and well-nourished. No distress.  HENT:  Head: Normocephalic and atraumatic.  Eyes: Conjunctivae are normal. Right eye exhibits no discharge. Left eye exhibits no discharge. No scleral icterus.  Neck: Normal  range of motion.  Respiratory: Effort normal. No respiratory distress.  GI: There is no CVA tenderness.  Neurological: She is alert and oriented to person, place, and time.  Skin: Skin is warm and dry. She is not diaphoretic.  Psychiatric: She has a normal mood and affect. Her behavior is normal. Judgment and thought content normal.    MAU Course  Procedures Results for orders placed or performed during the hospital encounter of 03/07/18 (from the past 24 hour(s))  Urinalysis, Routine w reflex microscopic     Status: Abnormal   Collection Time: 03/07/18  4:54 PM  Result Value Ref Range   Color, Urine  YELLOW YELLOW   APPearance CLEAR CLEAR   Specific Gravity, Urine 1.023 1.005 - 1.030   pH 5.0 5.0 - 8.0   Glucose, UA NEGATIVE NEGATIVE mg/dL   Hgb urine dipstick NEGATIVE NEGATIVE   Bilirubin Urine NEGATIVE NEGATIVE   Ketones, ur NEGATIVE NEGATIVE mg/dL   Protein, ur NEGATIVE NEGATIVE mg/dL   Nitrite NEGATIVE NEGATIVE   Leukocytes, UA SMALL (A) NEGATIVE   RBC / HPF 0-5 0 - 5 RBC/hpf   WBC, UA 21-50 0 - 5 WBC/hpf   Bacteria, UA FEW (A) NONE SEEN   Squamous Epithelial / LPF 0-5 0 - 5   Mucus PRESENT   Pregnancy, urine POC     Status: None   Collection Time: 03/07/18  5:18 PM  Result Value Ref Range   Preg Test, Ur NEGATIVE NEGATIVE    MDM UPT negative Declines STI testing No CVAT. VSS, not afebrile  Assessment and Plan  A: 1. Acute cystitis without hematuria    P: Discharge home Rx bactrim Increase water intake F/u with PCP  Judeth Horn 03/07/2018, 5:41 PM

## 2018-03-07 NOTE — Discharge Instructions (Signed)
No Primary Care Doctor:  To locate a primary care doctor that accepts your insurance or provides certain services:           [place] Connect: [phone]           Physician Referral Service: [phone] ask for My [place]  If no insurance, you need to see if you qualify for [facility] orange card, call to set      up appointment for eligibility/enrollment at [phone] or [phone] or visit [facility] of Health and [employer] ([address], [place] and [address]) to meet with a [facility] enrollment specialist.      Urinary Tract Infection, Adult A urinary tract infection (UTI) is an infection of any part of the urinary tract, which includes the kidneys, ureters, bladder, and urethra. These organs make, store, and get rid of urine in the body. UTI can be a bladder infection (cystitis) or kidney infection (pyelonephritis). What are the causes? This infection may be caused by fungi, viruses, or bacteria. Bacteria are the most common cause of UTIs. This condition can also be caused by repeated incomplete emptying of the bladder during urination. What increases the risk? This condition is more likely to develop if:  You ignore your need to urinate or hold urine for long periods of time.  You do not empty your bladder completely during urination.  You wipe back to front after urinating or having a bowel movement, if you are female.  You are uncircumcised, if you are female.  You are constipated.  You have a urinary catheter that stays in place (indwelling).  You have a weak defense (immune) system.  You have a medical condition that affects your bowels, kidneys, or bladder.  You have diabetes.  You take antibiotic medicines frequently or for long periods of time, and the antibiotics no longer work well against certain types of infections (antibiotic resistance).  You take medicines that irritate your urinary tract.  You are exposed to  chemicals that irritate your urinary tract.  You are female.  What are the signs or symptoms? Symptoms of this condition include:  Fever.  Frequent urination or passing small amounts of urine frequently.  Needing to urinate urgently.  Pain or burning with urination.  Urine that smells bad or unusual.  Cloudy urine.  Pain in the lower abdomen or back.  Trouble urinating.  Blood in the urine.  Vomiting or being less hungry than normal.  Diarrhea or abdominal pain.  Vaginal discharge, if you are female.  How is this diagnosed? This condition is diagnosed with a medical history and physical exam. You will also need to provide a urine sample to test your urine. Other tests may be done, including:  Blood tests.  Sexually transmitted disease (STD) testing.  If you have had more than one UTI, a cystoscopy or imaging studies may be done to determine the cause of the infections. How is this treated? Treatment for this condition often includes a combination of two or more of the following:  Antibiotic medicine.  Other medicines to treat less common causes of UTI.  Over-the-counter medicines to treat pain.  Drinking enough water to stay hydrated.  Follow these instructions at home:  Take over-the-counter and prescription medicines only as told by your health care provider.  If you were prescribed an antibiotic, take it as told by your health care provider. Do not stop taking the  antibiotic even if you start to feel better.  Avoid alcohol, caffeine, tea, and carbonated beverages. They can irritate your bladder.  Drink enough fluid to keep your urine clear or pale yellow.  Keep all follow-up visits as told by your health care provider. This is important.  Make sure to: ? Empty your bladder often and completely. Do not hold urine for long periods of time. ? Empty your bladder before and after sex. ? Wipe from front to back after a bowel movement if you are female.  Use each tissue one time when you wipe. Contact a health care provider if:  You have back pain.  You have a fever.  You feel nauseous or vomit.  Your symptoms do not get better after 3 days.  Your symptoms go away and then return. Get help right away if:  You have severe back pain or lower abdominal pain.  You are vomiting and cannot keep down any medicines or water. This information is not intended to replace advice given to you by your health care provider. Make sure you discuss any questions you have with your health care provider. Document Released: 07/15/2005 Document Revised: 03/18/2016 Document Reviewed: 08/26/2015 Elsevier Interactive Patient Education  [employer].

## 2018-03-07 NOTE — MAU Note (Signed)
Pt reports pain and pressure with urination, lower back pain.

## 2018-10-27 DIAGNOSIS — H6590 Unspecified nonsuppurative otitis media, unspecified ear: Secondary | ICD-10-CM | POA: Diagnosis not present

## 2019-09-24 DIAGNOSIS — N390 Urinary tract infection, site not specified: Secondary | ICD-10-CM | POA: Diagnosis not present

## 2020-04-18 ENCOUNTER — Emergency Department (HOSPITAL_COMMUNITY)
Admission: EM | Admit: 2020-04-18 | Discharge: 2020-04-18 | Disposition: A | Discharge status: Home / Self Care | Payer: BC Managed Care – PPO | Attending: Emergency Medicine | Admitting: Emergency Medicine

## 2020-04-18 ENCOUNTER — Other Ambulatory Visit: Payer: Self-pay

## 2020-04-18 ENCOUNTER — Encounter (HOSPITAL_COMMUNITY): Payer: Self-pay

## 2020-04-18 DIAGNOSIS — Z91040 Latex allergy status: Secondary | ICD-10-CM | POA: Insufficient documentation

## 2020-04-18 DIAGNOSIS — Y998 Other external cause status: Secondary | ICD-10-CM | POA: Insufficient documentation

## 2020-04-18 DIAGNOSIS — S161XXA Strain of muscle, fascia and tendon at neck level, initial encounter: Secondary | ICD-10-CM | POA: Diagnosis not present

## 2020-04-18 DIAGNOSIS — F17210 Nicotine dependence, cigarettes, uncomplicated: Secondary | ICD-10-CM | POA: Insufficient documentation

## 2020-04-18 DIAGNOSIS — Y92410 Unspecified street and highway as the place of occurrence of the external cause: Secondary | ICD-10-CM | POA: Diagnosis not present

## 2020-04-18 DIAGNOSIS — Y9389 Activity, other specified: Secondary | ICD-10-CM | POA: Diagnosis not present

## 2020-04-18 DIAGNOSIS — S199XXA Unspecified injury of neck, initial encounter: Secondary | ICD-10-CM | POA: Diagnosis not present

## 2020-04-18 MED ORDER — CYCLOBENZAPRINE HCL 10 MG PO TABS: 10.0000 mg | ORAL_TABLET | Freq: Two times a day (BID) | ORAL | 0 refills | Status: DC | PRN
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
## 2020-07-11 DIAGNOSIS — G43909 Migraine, unspecified, not intractable, without status migrainosus: Secondary | ICD-10-CM | POA: Diagnosis not present

## 2020-07-11 DIAGNOSIS — K047 Periapical abscess without sinus: Secondary | ICD-10-CM | POA: Diagnosis not present

## 2020-07-11 DIAGNOSIS — E669 Obesity, unspecified: Secondary | ICD-10-CM | POA: Diagnosis not present

## 2020-07-11 DIAGNOSIS — M2550 Pain in unspecified joint: Secondary | ICD-10-CM | POA: Diagnosis not present

## 2021-01-23 ENCOUNTER — Encounter (HOSPITAL_COMMUNITY): Payer: Self-pay | Admitting: Emergency Medicine

## 2021-01-23 ENCOUNTER — Other Ambulatory Visit: Payer: Self-pay

## 2021-01-23 ENCOUNTER — Emergency Department (HOSPITAL_COMMUNITY)
Admission: EM | Admit: 2021-01-23 | Discharge: 2021-01-23 | Disposition: A | Discharge status: Home / Self Care | Payer: BC Managed Care – PPO | Attending: Emergency Medicine | Admitting: Emergency Medicine

## 2021-01-23 DIAGNOSIS — R519 Headache, unspecified: Secondary | ICD-10-CM | POA: Diagnosis not present

## 2021-01-23 DIAGNOSIS — R112 Nausea with vomiting, unspecified: Secondary | ICD-10-CM | POA: Insufficient documentation

## 2021-01-23 DIAGNOSIS — F40298 Other specified phobia: Secondary | ICD-10-CM | POA: Insufficient documentation

## 2021-01-23 DIAGNOSIS — Z91040 Latex allergy status: Secondary | ICD-10-CM | POA: Insufficient documentation

## 2021-01-23 DIAGNOSIS — H53149 Visual discomfort, unspecified: Secondary | ICD-10-CM | POA: Diagnosis not present

## 2021-01-23 DIAGNOSIS — F17210 Nicotine dependence, cigarettes, uncomplicated: Secondary | ICD-10-CM | POA: Diagnosis not present

## 2021-01-23 DIAGNOSIS — R61 Generalized hyperhidrosis: Secondary | ICD-10-CM | POA: Insufficient documentation

## 2021-01-23 MED ORDER — METOCLOPRAMIDE HCL 5 MG/ML IJ SOLN
10.0000 mg | Freq: Once | INTRAMUSCULAR | Status: AC
Start: 2021-01-23 — End: 2021-01-23
  Administered 2021-01-23: 10 mg via INTRAVENOUS
  Filled 2021-01-23: qty 2

## 2021-01-23 MED ORDER — METOCLOPRAMIDE HCL 10 MG PO TABS: 10.0000 mg | ORAL_TABLET | Freq: Two times a day (BID) | ORAL | 0 refills | Status: AC | PRN

## 2021-01-23 MED ORDER — DEXAMETHASONE SODIUM PHOSPHATE 10 MG/ML IJ SOLN
10.0000 mg | Freq: Once | INTRAMUSCULAR | Status: AC
  Administered 2021-01-23: 10 mg via INTRAVENOUS
  Filled 2021-01-23: qty 1

## 2021-01-23 MED ORDER — DIPHENHYDRAMINE HCL 50 MG/ML IJ SOLN
25.0000 mg | Freq: Once | INTRAMUSCULAR | Status: AC
  Administered 2021-01-23: 25 mg via INTRAVENOUS
  Filled 2021-01-23: qty 1

## 2021-01-23 MED ORDER — SODIUM CHLORIDE 0.9 % IV BOLUS
1000.0000 mL | Freq: Once | INTRAVENOUS | Status: AC
  Administered 2021-01-23: 1000 mL via INTRAVENOUS

## 2021-01-23 NOTE — ED Provider Notes (Signed)
[facility] Provider Note   CSN: [id number] Arrival date & time: 01/23/21  0825     History Chief Complaint  Patient presents with  . Migraine    Stacie Carrillo is a 40 y.o. female.  HPI Patient is a 40 year old female with a medical history as noted below.  She presents the emergency department today due to a bad headache.  Patient states her symptoms started about 4 hours ago and woke her from sleep.  She states her headache is all around the frontal region of the head.  She reports associated nausea, vomiting, diaphoresis, photophobia, phonophobia.  She states that all of the symptoms are typical when she experiences a migraine.  She states that her current symptoms are consistent with prior migraines.  She states that she has health insurance but does not have a PCP, thus does not have any medications at home for her migraines.  She denies any numbness, tingling, weakness, fevers, chills, chest pain, shortness of breath.    Past Medical History:  Diagnosis Date  . Anemia   . Anxiety   . Depression   . Gestational diabetes   . Obesity   . Polycystic ovarian syndrome   . Reflux   . Ulcer     Patient Active Problem List   Diagnosis Date Noted  . Abscess of axilla, right 10/10/2013  . Hydradenitis 10/08/2013  . Cellulitis of axilla, right 10/08/2013  . Cellulitis 10/08/2013  . Stricture and stenosis of esophagus 12/31/2011  . Esophageal reflux 11/24/2011  . Anemia 11/24/2011  . POLYCYSTIC OVARY 12/16/2006  . OBESITY, NOS 12/16/2006  . TOBACCO DEPENDENCE 12/16/2006    Past Surgical History:  Procedure Laterality Date  . CESAREAN SECTION     x4  . IRRIGATION AND DEBRIDEMENT ABSCESS Right 10/09/2013   Procedure: IRRIGATION AND DEBRIDEMENT RIGHT AXILLARY ABSCESS;  Surgeon: Kandis Cocking, MD;  Location: [facility];  Service: General;  Laterality: Right;  . TUBAL LIGATION       OB History    Gravida  4   Para  4   Term  4    Preterm  0   AB  0   Living  4     SAB  0   IAB  0   Ectopic  0   Multiple  0   Live Births  1           Family History  Problem Relation Age of Onset  . Throat cancer Maternal Aunt   . Cirrhosis Mother   . Diabetes Paternal Grandmother     Social History   Tobacco Use  . Smoking status: Current Every Day Smoker    Packs/day: 0.15    Years: 19.00    Pack years: 2.85    Types: Cigarettes  . Smokeless tobacco: Never Used  Vaping Use  . Vaping Use: Never used  Substance Use Topics  . Alcohol use: No  . Drug use: No    Home Medications Prior to Admission medications   Medication Sig Start Date End Date Taking? Authorizing Provider  metoCLOPramide (REGLAN) 10 MG tablet Take 1 tablet (10 mg total) by mouth 2 (two) times daily as needed (Migraine headaches). 01/23/21  Yes Placido Sou, PA-C  cyclobenzaprine (FLEXERIL) 10 MG tablet Take 1 tablet (10 mg total) by mouth 2 (two) times daily as needed for muscle spasms. 04/18/20   Tilden Fossa, MD  omeprazole (PRILOSEC) 20 MG capsule Take 1 capsule (20 mg total) by  mouth daily. 11/12/17 12/12/17  Wieters, Hallie C, PA-C  sulfamethoxazole-trimethoprim (BACTRIM DS,SEPTRA DS) 800-160 MG tablet Take 1 tablet by mouth 2 (two) times daily. 03/07/18   Judeth Horn, NP  amoxicillin-clarithromycin-lansoprazole [facility]) combo pack Take by mouth 2 (two) times daily. Follow package directions. 12/11/11 12/31/11  Louis Meckel, MD    Allergies    Latex  Review of Systems   Review of Systems  All other systems reviewed and are negative. Ten systems reviewed and are negative for acute change, except as noted in the HPI.   Physical Exam Updated Vital Signs BP 100/85   Pulse 94   Temp (!) 97.4 F (36.3 C) (Oral)   Resp 20   SpO2 100%   Physical Exam Vitals and nursing note reviewed.  Constitutional:      General: She is not in acute distress.    Appearance: Normal appearance. She is not ill-appearing, toxic-appearing  or diaphoretic.  HENT:     Head: Normocephalic and atraumatic.     Right Ear: External ear normal.     Left Ear: External ear normal.     Nose: Nose normal.     Mouth/Throat:     Mouth: Mucous membranes are moist.     Pharynx: Oropharynx is clear. No oropharyngeal exudate or posterior oropharyngeal erythema.  Eyes:     General: No scleral icterus.       Right eye: No discharge.        Left eye: No discharge.     Extraocular Movements: Extraocular movements intact.     Conjunctiva/sclera: Conjunctivae normal.     Pupils: Pupils are equal, round, and reactive to light.     Comments: EOMI. PERRL.  Cardiovascular:     Rate and Rhythm: Normal rate and regular rhythm.     Pulses: Normal pulses.     Heart sounds: Normal heart sounds. No murmur heard. No friction rub. No gallop.   Pulmonary:     Effort: Pulmonary effort is normal. No respiratory distress.     Breath sounds: Normal breath sounds. No stridor. No wheezing, rhonchi or rales.  Abdominal:     General: Abdomen is flat.     Tenderness: There is no abdominal tenderness.  Musculoskeletal:        General: Normal range of motion.     Cervical back: Normal range of motion and neck supple. No tenderness.  Skin:    General: Skin is warm and dry.  Neurological:     General: No focal deficit present.     Mental Status: She is alert and oriented to person, place, and time.     Comments: Speaking clearly, coherently, and in complete sentences.  Moving all 4 extremities with ease.  No gross deficits.  Psychiatric:        Mood and Affect: Mood normal.        Behavior: Behavior normal.    ED Results / Procedures / Treatments   Labs (all labs ordered are listed, but only abnormal results are displayed) Labs Reviewed - No data to display  EKG None  Radiology No results found.  Procedures Procedures   Medications Ordered in ED Medications  metoCLOPramide (REGLAN) injection 10 mg (10 mg Intravenous Given 01/23/21 0900)   diphenhydrAMINE (BENADRYL) injection 25 mg (25 mg Intravenous Given 01/23/21 0901)  sodium chloride 0.9 % bolus 1,000 mL (1,000 mLs Intravenous New Bag/Given 01/23/21 0903)  dexamethasone (DECADRON) injection 10 mg (10 mg Intravenous Given 01/23/21 1021)   ED Course  I have reviewed the triage vital signs and the nursing notes.  Pertinent labs & imaging results that were available during my care of the patient were reviewed by me and considered in my medical decision making (see chart for details).    MDM Rules/Calculators/A&P                          Pt is a 40 y.o. female who presents emergency department with a bad headache.  Patient states her symptoms today are consistent with prior headaches.  Denies any numbness, weakness, visual changes.  She reports photophobia and phonophobia.  Physical exam is reassuring.  Neurological exam is benign.   Patient given a migraine cocktail including Benadryl, Reglan, IV fluids.  She notes complete resolution of her headache.  She was given IV Decadron to prevent rebound headache.  She typically takes Reglan and Benadryl at home for headaches but states that her headache started while she was asleep and was unable to take these medications prior to the onset of it so she came to the emergency department.  I refilled her Reglan.  Feel the patient is stable for discharge at this time and she is agreeable.  Recommended that she continue to monitor symptoms and if they worsen to come back to the emergency department.  She verbalized understanding of the above plan.  Her questions were answered and she was amicable to time of discharge.  Note: Portions of this report may have been transcribed using voice recognition software. Every effort was made to ensure accuracy; however, inadvertent computerized transcription errors may be present.   Final Clinical Impression(s) / ED Diagnoses Final diagnoses:  Bad headache    Rx / DC Orders ED Discharge Orders          Ordered    metoCLOPramide (REGLAN) 10 MG tablet  2 times daily PRN        01/23/21 1041           Placido Sou, PA-C 01/23/21 1053    Cathren Laine, MD 01/23/21 1136

## 2021-01-23 NOTE — ED Triage Notes (Signed)
Pt arrives POV with a migraine. Pt states she has N/V/D, sweating and a headache that began at [facility]. Pt states all symptoms are the same as when she normally gets migraines.

## 2021-01-23 NOTE — Discharge Instructions (Addendum)
I have prescribed you a medication called Reglan.  This is a nausea medication you can also take for migraines.  You can continue to take this with Benadryl and Tylenol just like you have been doing in the past.  If your symptoms worsen once again, please return the emergency department immediately for reevaluation.  Otherwise, please follow-up with your regular doctor.  It was a pleasure to meet you.

## 2021-01-28 DIAGNOSIS — Z716 Tobacco abuse counseling: Secondary | ICD-10-CM | POA: Diagnosis not present

## 2021-01-28 DIAGNOSIS — G43009 Migraine without aura, not intractable, without status migrainosus: Secondary | ICD-10-CM | POA: Insufficient documentation

## 2021-01-28 DIAGNOSIS — J309 Allergic rhinitis, unspecified: Secondary | ICD-10-CM | POA: Diagnosis not present

## 2021-01-28 DIAGNOSIS — K257 Chronic gastric ulcer without hemorrhage or perforation: Secondary | ICD-10-CM | POA: Insufficient documentation

## 2021-02-17 DIAGNOSIS — M25561 Pain in right knee: Secondary | ICD-10-CM | POA: Diagnosis not present

## 2021-02-26 ENCOUNTER — Other Ambulatory Visit: Payer: Self-pay | Admitting: Family Medicine

## 2021-02-26 DIAGNOSIS — Z124 Encounter for screening for malignant neoplasm of cervix: Secondary | ICD-10-CM | POA: Diagnosis not present

## 2021-02-26 DIAGNOSIS — Z1231 Encounter for screening mammogram for malignant neoplasm of breast: Secondary | ICD-10-CM

## 2021-02-26 DIAGNOSIS — Z0000 Encounter for general adult medical examination without abnormal findings: Secondary | ICD-10-CM | POA: Diagnosis not present

## 2021-02-26 DIAGNOSIS — R739 Hyperglycemia, unspecified: Secondary | ICD-10-CM | POA: Diagnosis not present

## 2021-02-26 DIAGNOSIS — Z1159 Encounter for screening for other viral diseases: Secondary | ICD-10-CM | POA: Diagnosis not present

## 2021-02-28 ENCOUNTER — Ambulatory Visit: Payer: BC Managed Care – PPO | Admitting: Orthopaedic Surgery

## 2021-03-04 ENCOUNTER — Ambulatory Visit (INDEPENDENT_AMBULATORY_CARE_PROVIDER_SITE_OTHER): Payer: BC Managed Care – PPO | Admitting: Orthopaedic Surgery

## 2021-03-04 ENCOUNTER — Encounter: Payer: Self-pay | Admitting: Orthopaedic Surgery

## 2021-03-04 ENCOUNTER — Ambulatory Visit: Payer: Self-pay

## 2021-03-04 ENCOUNTER — Other Ambulatory Visit: Payer: Self-pay

## 2021-03-04 VITALS — Ht 65.0 in | Wt 213.0 lb

## 2021-03-04 DIAGNOSIS — M1711 Unilateral primary osteoarthritis, right knee: Secondary | ICD-10-CM

## 2021-03-04 MED ORDER — MELOXICAM 7.5 MG PO TABS: 7.5000 mg | ORAL_TABLET | Freq: Every day | ORAL | 2 refills | Status: AC

## 2021-03-04 NOTE — Progress Notes (Signed)
Office Visit Note   Patient: Stacie Carrillo           Date of Birth: Aug 26, 1981           MRN: [id number] Visit Date: 03/04/2021              Requested by: Bess Harvest, PA-C [address] [place],  [place] [address] PCP: Bess Harvest, PA-C   Assessment & Plan: Visit Diagnoses:  1. Primary osteoarthritis of right knee     Plan: Impression is right knee arthritis flareup.  Today, her symptoms are not very bad.  We have discussed trying a topical and oral anti-inflammatory at this point.  Should her symptoms worsen or not improve, she will follow-up with [place] for cortisone injection.  Call with concerns or questions in the meantime.  Follow-Up Instructions: Return if symptoms worsen or fail to improve.   Orders:  Orders Placed This Encounter  Procedures  . XR KNEE 3 VIEW RIGHT   Meds ordered this encounter  Medications  . meloxicam (MOBIC) 7.5 MG tablet    Sig: Take 1 tablet (7.5 mg total) by mouth daily for 14 doses.    Dispense:  30 tablet    Refill:  2      Procedures: No procedures performed   Clinical Data: No additional findings.   Subjective: Chief Complaint  Patient presents with  . Right Knee - Pain    HPI patient is a pleasant 40 year old female who comes in today with right knee pain for the past few months.  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  The pain she has is deep within the knee and is worse with flexion type activities to include stair climbing.  She does note occasional locking catching.  She has tried Tylenol with minimal relief.  No previous cortisone injection or surgical intervention to the right knee.  Review of Systems as detailed in HPI.  All others reviewed and are negative.   Objective: Vital Signs: Ht 5\' 5"  (1.651 m)   Wt 213 lb (96.6 kg)   BMI 35.45 kg/m   Physical Exam well-developed well-nourished female no acute distress peer alert and oriented  x3.  Ortho Exam examination of the right knee shows no effusion.  Range of motion 0 to 125 degrees.  Mild medial joint line tenderness.  Ligaments are stable.  She is neurovascular intact distally.  Specialty Comments:  No specialty comments available.  Imaging: XR KNEE 3 VIEW RIGHT  Result Date: 03/04/2021 X-rays demonstrate moderate degenerative changes the medial compartment    PMFS History: Patient Active Problem List   Diagnosis Date Noted  . Abscess of axilla, right 10/10/2013  . Hydradenitis 10/08/2013  . Cellulitis of axilla, right 10/08/2013  . Cellulitis 10/08/2013  . Stricture and stenosis of esophagus 12/31/2011  . Esophageal reflux 11/24/2011  . Anemia 11/24/2011  . POLYCYSTIC OVARY 12/16/2006  . OBESITY, NOS 12/16/2006  . TOBACCO DEPENDENCE 12/16/2006   Past Medical History:  Diagnosis Date  . Anemia   . Anxiety   . Depression   . Gestational diabetes   . Obesity   . Polycystic ovarian syndrome   . Reflux   . Ulcer     Family History  Problem Relation Age of Onset  . Throat cancer Maternal Aunt   . Cirrhosis Mother   . Diabetes Paternal Grandmother     Past Surgical History:  Procedure Laterality  Date  . CESAREAN SECTION     x4  . IRRIGATION AND DEBRIDEMENT ABSCESS Right 10/09/2013   Procedure: IRRIGATION AND DEBRIDEMENT RIGHT AXILLARY ABSCESS;  Surgeon: Kandis Cocking, MD;  Location: [facility];  Service: General;  Laterality: Right;  . TUBAL LIGATION     Social History   Occupational History  . Occupation: HOUSEKEEPING    Employer: [employer]  Tobacco Use  . Smoking status: Current Every Day Smoker    Packs/day: 0.15    Years: 19.00    Pack years: 2.85    Types: Cigarettes  . Smokeless tobacco: Never Used  Vaping Use  . Vaping Use: Never used  Substance and Sexual Activity  . Alcohol use: No  . Drug use: No  . Sexual activity: Yes

## 2021-03-28 ENCOUNTER — Ambulatory Visit: Payer: BC Managed Care – PPO | Admitting: Orthopaedic Surgery

## 2021-04-04 ENCOUNTER — Encounter: Payer: Self-pay | Admitting: Orthopaedic Surgery

## 2021-04-04 ENCOUNTER — Ambulatory Visit: Payer: Self-pay

## 2021-04-04 ENCOUNTER — Ambulatory Visit (INDEPENDENT_AMBULATORY_CARE_PROVIDER_SITE_OTHER): Payer: BC Managed Care – PPO | Admitting: Orthopaedic Surgery

## 2021-04-04 ENCOUNTER — Other Ambulatory Visit: Payer: Self-pay

## 2021-04-04 VITALS — Ht 65.0 in | Wt 203.0 lb

## 2021-04-04 DIAGNOSIS — M542 Cervicalgia: Secondary | ICD-10-CM

## 2021-04-04 DIAGNOSIS — M19011 Primary osteoarthritis, right shoulder: Secondary | ICD-10-CM | POA: Diagnosis not present

## 2021-04-04 DIAGNOSIS — M1711 Unilateral primary osteoarthritis, right knee: Secondary | ICD-10-CM | POA: Diagnosis not present

## 2021-04-04 MED ORDER — DICLOFENAC SODIUM 2 % EX SOLN: 2.0000 g | Freq: Two times a day (BID) | CUTANEOUS | 3 refills | Status: AC | PRN

## 2021-04-04 NOTE — Progress Notes (Signed)
Office Visit Note   Patient: Stacie Carrillo           Date of Birth: 02-Apr-1981           MRN: [id number] Visit Date: 04/04/2021              Requested by: Bess Harvest, PA-C [address] [place],  [place] [address] PCP: Bess Harvest, PA-C   Assessment & Plan: Visit Diagnoses:  1. Primary osteoarthritis of right knee   2. Neck pain   3. Arthritis of right acromioclavicular joint     Plan: Impression is right shoulder pain due to [facility] joint arthritis. She has known cervical radiculopathy however her current symptoms do not seem to be coming from the neck. We discussed the natural history and treatment options including topical analgesics. Patient experienced good relief with Pennsaid in the past and would like to use this for her shoulder. We sent in a prescription Pennsaid and provided her with a sample to use in the interim. We'll see her back as needed.   Follow-Up Instructions: Return if symptoms worsen or fail to improve.   Orders:  Orders Placed This Encounter  Procedures   XR Shoulder Right   XR Cervical Spine 2 or 3 views   Meds ordered this encounter  Medications   Diclofenac Sodium (PENNSAID) 2 % SOLN    Sig: Apply 2 g topically 2 (two) times daily as needed (to affected area).    Dispense:  112 g    Refill:  3     Procedures: No procedures performed   Clinical Data: No additional findings.   Subjective: Chief Complaint  Patient presents with   Right Shoulder - Pain    HPI Stacie Carrillo is a 40 y.o. RHD female who presents for evaluation of right shoulder pain that has been occurring for years, worse in the last few months. She describes superior/anterior shoulder pain that is achy in nature and worse with reaching and lifting. 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 She endorses popping and clicking in the shoulder. Past treatment includes Tylenol and Voltaren gel. She has tried Pennsaid for knee arthritis in the past  and experienced significant relief with this. She has known cervical radiculopathy and experiences bilateral arm pain and numbness, however her current shoulder symptoms are different than the underlying radiculopathy.   Review of Systems Review of Systems was reviewed and negative unless as stated in the HPI.   Objective: Vital Signs: Ht 5\' 5"  (1.651 m)   Wt 203 lb (92.1 kg)   BMI 33.78 kg/m   Physical Exam  Ortho Exam Right shoulder exam demonstrates tenderness over the [facility] joint with positive crossarm adduction test. Full ROM with forward flexion, internal and external rotation. 5/5 cuff strength. Distal neurovascular exam intact.   Specialty Comments:  No specialty comments available.  Imaging: Radiographs of the right shoulder dated 04/04/21 were reviewed and demonstrate degenerative changes of the [facility] joint.   PMFS History: Patient Active Problem List   Diagnosis Date Noted   Abscess of axilla, right 10/10/2013   Hydradenitis 10/08/2013   Cellulitis of axilla, right 10/08/2013   Cellulitis 10/08/2013   Stricture and stenosis of esophagus 12/31/2011   Esophageal reflux 11/24/2011   Anemia 11/24/2011   POLYCYSTIC OVARY 12/16/2006   OBESITY, NOS 12/16/2006   TOBACCO DEPENDENCE 12/16/2006   Past Medical History:  Diagnosis Date   Anemia    Anxiety    Depression  Gestational diabetes    Obesity    Polycystic ovarian syndrome    Reflux    Ulcer     Family History  Problem Relation Age of Onset   Throat cancer Maternal Aunt    Cirrhosis Mother    Diabetes Paternal Grandmother     Past Surgical History:  Procedure Laterality Date   CESAREAN SECTION     x4   IRRIGATION AND DEBRIDEMENT ABSCESS Right 10/09/2013   Procedure: IRRIGATION AND DEBRIDEMENT RIGHT AXILLARY ABSCESS;  Surgeon: Kandis Cocking, MD;  Location: [facility];  Service: General;  Laterality: Right;   TUBAL LIGATION     Social History   Occupational History   Occupation: HOUSEKEEPING    Employer:  [employer]  Tobacco Use   Smoking status: Every Day    Packs/day: 0.15    Years: 19.00    Pack years: 2.85    Types: Cigarettes   Smokeless tobacco: Never  Vaping Use   Vaping Use: Never used  Substance and Sexual Activity   Alcohol use: No   Drug use: No   Sexual activity: Yes

## 2021-04-24 ENCOUNTER — Ambulatory Visit
Admission: RE | Admit: 2021-04-24 | Discharge: 2021-04-24 | Disposition: A | Discharge status: Home / Self Care | Payer: BC Managed Care – PPO | Source: Ambulatory Visit | Attending: Family Medicine | Admitting: Family Medicine

## 2021-04-24 ENCOUNTER — Other Ambulatory Visit: Payer: Self-pay

## 2021-04-24 DIAGNOSIS — Z1231 Encounter for screening mammogram for malignant neoplasm of breast: Secondary | ICD-10-CM

## 2021-06-02 DIAGNOSIS — H6121 Impacted cerumen, right ear: Secondary | ICD-10-CM | POA: Diagnosis not present

## 2021-06-18 DIAGNOSIS — S0502XA Injury of conjunctiva and corneal abrasion without foreign body, left eye, initial encounter: Secondary | ICD-10-CM | POA: Diagnosis not present

## 2021-06-18 DIAGNOSIS — J309 Allergic rhinitis, unspecified: Secondary | ICD-10-CM | POA: Diagnosis not present

## 2021-06-24 ENCOUNTER — Ambulatory Visit (INDEPENDENT_AMBULATORY_CARE_PROVIDER_SITE_OTHER): Payer: BC Managed Care – PPO | Admitting: Gastroenterology

## 2021-06-24 ENCOUNTER — Encounter: Payer: Self-pay | Admitting: Gastroenterology

## 2021-06-24 VITALS — BP 110/80 | HR 98 | Ht 65.0 in | Wt 208.0 lb

## 2021-06-24 DIAGNOSIS — R1084 Generalized abdominal pain: Secondary | ICD-10-CM | POA: Diagnosis not present

## 2021-06-24 DIAGNOSIS — Z8711 Personal history of peptic ulcer disease: Secondary | ICD-10-CM

## 2021-06-24 DIAGNOSIS — R1310 Dysphagia, unspecified: Secondary | ICD-10-CM | POA: Diagnosis not present

## 2021-06-24 MED ORDER — DICYCLOMINE HCL 20 MG PO TABS: 20.0000 mg | ORAL_TABLET | Freq: Four times a day (QID) | ORAL | 1 refills | Status: DC

## 2021-06-24 NOTE — Patient Instructions (Signed)
If you are age 40 or older, your body mass index should be between 23-30. Your Body mass index is 34.61 kg/m. If this is out of the aforementioned range listed, please consider follow up with your Primary Care Provider.  If you are age 49 or younger, your body mass index should be between 19-25. Your Body mass index is 34.61 kg/m. If this is out of the aformentioned range listed, please consider follow up with your Primary Care Provider.   You have been scheduled for an endoscopy. Please follow written instructions given to you at your visit today. If you use inhalers (even only as needed), please bring them with you on the day of your procedure.   We have sent the following medications to your pharmacy for you to pick up at your convenience: Bentyl 20 mg   The [place] [facility] providers would like to encourage you to use [facility] to communicate with providers for non-urgent requests or questions.  Due to long hold times on the telephone, sending your provider a message by [facility] may be a faster and more efficient way to get a response.  Please allow 48 business hours for a response.  Please remember that this is for non-urgent requests.   Due to recent changes in healthcare laws, you may see the results of your imaging and laboratory studies on MyChart before your provider has had a chance to review them.  We understand that in some cases there may be results that are confusing or concerning to you. Not all laboratory results come back in the same time frame and the provider may be waiting for multiple results in order to interpret others.  Please give [place] 48 hours in order for your provider to thoroughly review all the results before contacting the office for clarification of your results.   It was a pleasure to see you today!  Thank you for trusting me with your gastrointestinal care!    Scott E. Cunningham,MD

## 2021-06-24 NOTE — Progress Notes (Signed)
HPI : Stacie Carrillo is a very pleasant 40 year old female with a history of depression, anxiety and PCOS referred to [place] by Edwyna Ready, PA-C for further evaluation of abdominal pain.  She states that this abdominal pain started a few months ago, but it was particularly bad last month.  She describes the pain is a diffuse burning pain in her abdomen and chest which is not reliably associated with meals.  She also describes feelings of bloating, gas and crampy-like abdominal pain.  She has some nausea but no vomiting.  She has acid regurgitation several times a week which infrequently awakens her from sleep.  She also has been having frequent symptoms of food sitting in her chest needing to wash it down with a beverage. She has been taking omeprazole once a day for the past 3 to 4 months but does not seem to get much improvement.  She is also been avoiding caffeine, spicy food and alcohol.  She does not take any NSAIDs.  She denies any sustained weight loss.  She does continue to smoke cigarettes. She has had similar symptoms in the past, and underwent an upper endoscopy in 2013.  The EGD was notable for a few antral ulcers and a mild stenosis at the GE junction which was dilated with an 18 mm Maloney.  She reports improvement in her swallowing for a long time following this dilation.   Past Medical History:  Diagnosis Date   Anemia    Anxiety    Depression    Gestational diabetes    Obesity    Polycystic ovarian syndrome    Reflux    Ulcer      Past Surgical History:  Procedure Laterality Date   CESAREAN SECTION     x4   IRRIGATION AND DEBRIDEMENT ABSCESS Right 10/09/2013   Procedure: IRRIGATION AND DEBRIDEMENT RIGHT AXILLARY ABSCESS;  Surgeon: Kandis Cocking, MD;  Location: [facility];  Service: General;  Laterality: Right;   TUBAL LIGATION     Family History  Problem Relation Age of Onset   Throat cancer Maternal Aunt    Cirrhosis Mother    Diabetes Paternal Grandmother    Social  History   Tobacco Use   Smoking status: Every Day    Packs/day: 0.15    Years: 19.00    Pack years: 2.85    Types: Cigarettes   Smokeless tobacco: Never  Vaping Use   Vaping Use: Never used  Substance Use Topics   Alcohol use: No   Drug use: No   Current Outpatient Medications  Medication Sig Dispense Refill   ciprofloxacin (CILOXAN) 0.3 % ophthalmic solution Administer 1 drop, every 2 hours, while awake, for 2 days. Then 1 drop, every 4 hours, while awake, for the next 5 days.     cycloSPORINE (RESTASIS) 0.05 % ophthalmic emulsion 1 drop 2 (two) times a day.     Diclofenac Sodium (PENNSAID) 2 % SOLN Apply 2 g topically 2 (two) times daily as needed (to affected area). 112 g 3   fluticasone (FLONASE) 50 MCG/ACT nasal spray Place into the nose.     loratadine (CLARITIN) 10 MG tablet Take by mouth.     metoCLOPramide (REGLAN) 10 MG tablet Take 1 tablet (10 mg total) by mouth 2 (two) times daily as needed (Migraine headaches). 10 tablet 0   No current facility-administered medications for this visit.   Allergies  Allergen Reactions   Latex Other (See Comments)    Pt reports "eczema" with latex  use     Review of Systems: All systems reviewed and negative except where noted in HPI.    No results found.  Physical Exam: BP 110/80   Pulse 98   Ht 5\' 5"  (1.651 m)   Wt 208 lb (94.3 kg)   BMI 34.61 kg/m  Constitutional: Pleasant,well-developed, African-American female in no acute distress. HEENT: Normocephalic and atraumatic. Conjunctivae are normal. No scleral icterus. Cardiovascular: Normal rate, regular rhythm.  Pulmonary/chest: Effort normal and breath sounds normal. No wheezing, rales or rhonchi. Abdominal: Soft, nondistended, mild tenderness to palpation in the epigastrium. Bowel sounds active throughout. There are no masses palpable. No hepatomegaly. Extremities: no edema Neurological: Alert and oriented to person place and time. Skin: Skin is warm and dry. No rashes  noted. Psychiatric: Normal mood and affect. Behavior is normal.  CBC    Component Value Date/Time   WBC 15.4 (H) 01/11/2015 1011   RBC 4.36 01/11/2015 1011   HGB 15.6 (H) 04/07/2015 0529   HCT 46.0 04/07/2015 0529   PLT 221 01/11/2015 1011   MCV 91.1 01/11/2015 1011   MCH 32.8 01/11/2015 1011   MCHC 36.0 01/11/2015 1011   RDW 12.9 01/11/2015 1011   LYMPHSABS 3.0 06/04/2014 1733   MONOABS 1.0 06/04/2014 1733   EOSABS 0.2 06/04/2014 1733   BASOSABS 0.1 06/04/2014 1733    CMP     Component Value Date/Time   NA 141 04/07/2015 0529   K 3.8 04/07/2015 0529   CL 107 04/07/2015 0529   CO2 22 01/11/2015 1011   GLUCOSE 94 04/07/2015 0529   BUN 11 04/07/2015 0529   CREATININE 0.70 04/07/2015 0529   CALCIUM 8.9 01/11/2015 1011   PROT 7.6 01/11/2015 1011   ALBUMIN 4.2 01/11/2015 1011   AST 18 01/11/2015 1011   ALT 12 01/11/2015 1011   ALKPHOS 52 01/11/2015 1011   BILITOT 1.4 (H) 01/11/2015 1011   GFRNONAA >90 01/11/2015 1011   GFRAA >90 01/11/2015 1011     ASSESSMENT AND PLAN: 40 year old female with history of peptic ulcer disease and peptic stricture, with recurrence of burning epigastric pain and dysphagia, not significantly improved with once daily omeprazole.  I suspect she might have recurrence of the ulcers and stricture. We will plan for repeat upper endoscopy with possible dilation.  In the meantime, I recommended she increase her PPI to twice a day, 30 to 45 minutes before meals.  I also recommended she try taking Bentyl as needed for the crampy abdominal pain.  I strongly encouraged her to make a plan to stop smoking, as this will likely help her GERD symptoms and also reduce her risk for recurrent ulcers, as well as many other health benefits.  Burning abdominal pain, history of ulcers - EGD - Increase PPI to twice a day  Dysphagia, history of peptic stricture - EGD with possible dilation  Crampy abdominal pain, possible IBS - Bentyl 20 mg every 4 hours as  needed  The details, risks (including bleeding, perforation, infection, missed lesions, medication reactions and possible hospitalization or surgery if complications occur), benefits, and alternatives to EGD with possible biopsy and possible dilation were discussed with the patient and she consents to proceed.   Sabrine Patchen E. 45, MD [facility]  CC: Tomasa Rand, Corrine A, PA-C

## 2021-08-07 ENCOUNTER — Other Ambulatory Visit: Payer: Self-pay

## 2021-08-07 ENCOUNTER — Encounter: Payer: Self-pay | Admitting: Gastroenterology

## 2021-08-07 ENCOUNTER — Ambulatory Visit (AMBULATORY_SURGERY_CENTER): Payer: BC Managed Care – PPO | Admitting: Gastroenterology

## 2021-08-07 VITALS — BP 136/85 | HR 74 | Temp 96.8°F | Resp 14 | Ht 65.0 in | Wt 208.0 lb

## 2021-08-07 DIAGNOSIS — M79671 Pain in right foot: Secondary | ICD-10-CM | POA: Diagnosis not present

## 2021-08-07 DIAGNOSIS — K2970 Gastritis, unspecified, without bleeding: Secondary | ICD-10-CM | POA: Diagnosis not present

## 2021-08-07 DIAGNOSIS — R1084 Generalized abdominal pain: Secondary | ICD-10-CM

## 2021-08-07 DIAGNOSIS — R1310 Dysphagia, unspecified: Secondary | ICD-10-CM | POA: Diagnosis not present

## 2021-08-07 DIAGNOSIS — M21611 Bunion of right foot: Secondary | ICD-10-CM | POA: Diagnosis not present

## 2021-08-07 DIAGNOSIS — K2950 Unspecified chronic gastritis without bleeding: Secondary | ICD-10-CM | POA: Diagnosis not present

## 2021-08-07 MED ORDER — SODIUM CHLORIDE 0.9 % IV SOLN: 500.0000 mL | Freq: Once | INTRAVENOUS | Status: DC

## 2021-08-07 NOTE — Progress Notes (Signed)
Report to PACU, RN, vss, BBS= Clear.  

## 2021-08-07 NOTE — Patient Instructions (Signed)
Please read handouts provided. Continue present medications. Await pathology results. Follow-up as needed with Dr. Tomasa Rand in clinic for ongoing symptoms.   YOU HAD AN ENDOSCOPIC PROCEDURE TODAY AT THE [facility]:   Refer to the procedure report that was given to you for any specific questions about what was found during the examination.  If the procedure report does not answer your questions, please call your gastroenterologist to clarify.  If you requested that your care partner not be given the details of your procedure findings, then the procedure report has been included in a sealed envelope for you to review at your convenience later.  YOU SHOULD EXPECT: Some feelings of bloating in the abdomen. Passage of more gas than usual.  Walking can help get rid of the air that was put into your GI tract during the procedure and reduce the bloating. If you had a lower endoscopy (such as a colonoscopy or flexible sigmoidoscopy) you may notice spotting of blood in your stool or on the toilet paper. If you underwent a bowel prep for your procedure, you may not have a normal bowel movement for a few days.  Please Note:  You might notice some irritation and congestion in your nose or some drainage.  This is from the oxygen used during your procedure.  There is no need for concern and it should clear up in a day or so.  SYMPTOMS TO REPORT IMMEDIATELY:    Following upper endoscopy (EGD)  Vomiting of blood or coffee ground material  New chest pain or pain under the shoulder blades  Painful or persistently difficult swallowing  New shortness of breath  Fever of 100F or higher  Black, tarry-looking stools  For urgent or emergent issues, a gastroenterologist can be reached at any hour by calling [phone]. Do not use MyChart messaging for urgent concerns.    DIET:  We do recommend a small meal at first, but then you may proceed to your regular diet.  Drink plenty of fluids but you  should avoid alcoholic beverages for 24 hours.  ACTIVITY:  You should plan to take it easy for the rest of today and you should NOT DRIVE or use heavy machinery until tomorrow (because of the sedation medicines used during the test).    FOLLOW UP: Our staff will call the number listed on your records 48-72 hours following your procedure to check on you and address any questions or concerns that you may have regarding the information given to you following your procedure. If we do not reach you, we will leave a message.  We will attempt to reach you two times.  During this call, we will ask if you have developed any symptoms of COVID 19. If you develop any symptoms (ie: fever, flu-like symptoms, shortness of breath, cough etc.) before then, please call [phone].  If you test positive for Covid 19 in the 2 weeks post procedure, please call and report this information to [place].    If any biopsies were taken you will be contacted by phone or by letter within the next 1-3 weeks.  Please call us at [phone] if you have not heard about the biopsies in 3 weeks.    SIGNATURES/CONFIDENTIALITY: You and/or your care partner have signed paperwork which will be entered into your electronic medical record.  These signatures attest to the fact that that the information above on your After Visit Summary has been reviewed and is understood.  Full responsibility of the confidentiality  of this discharge information lies with you and/or your care-partner.  

## 2021-08-07 NOTE — Progress Notes (Signed)
[facility] History and Physical   Primary Care Physician:  Melvyn Neth, Corrine A, PA-C   Reason for Procedure:   Dysphagia, abdominal pain  Plan:    EGD with possible dilation     HPI: Stacie Carrillo is a 40 y.o. female with a history of peptic stricture and peptic ulcer disease with recurrent dysphagia and abdominal pain despite BID Protonix.  EGD to assess for recurrence of stricture/PUD, dilate if indicated.   Past Medical History:  Diagnosis Date   Anemia    Anxiety    Depression    Diabetes mellitus without complication (HCC)    Obesity    Polycystic ovarian syndrome    Reflux    Ulcer     Past Surgical History:  Procedure Laterality Date   CESAREAN SECTION     x4   IRRIGATION AND DEBRIDEMENT ABSCESS Right 10/09/2013   Procedure: IRRIGATION AND DEBRIDEMENT RIGHT AXILLARY ABSCESS;  Surgeon: Kandis Cocking, MD;  Location: [facility];  Service: General;  Laterality: Right;   TUBAL LIGATION     UPPER GASTROINTESTINAL ENDOSCOPY      Prior to Admission medications   Medication Sig Start Date End Date Taking? Authorizing Provider  ciprofloxacin (CILOXAN) 0.3 % ophthalmic solution Administer 1 drop, every 2 hours, while awake, for 2 days. Then 1 drop, every 4 hours, while awake, for the next 5 days. 06/18/21  Yes [provider]  cycloSPORINE (RESTASIS) 0.05 % ophthalmic emulsion 1 drop 2 (two) times a day.   Yes [provider]  dicyclomine (BENTYL) 20 MG tablet Take 1 tablet (20 mg total) by mouth every 6 (six) hours. 06/24/21  Yes Jenel Lucks, MD  fluticasone (FLONASE) 50 MCG/ACT nasal spray Place into the nose. 01/28/21 01/28/22 Yes [provider]  loratadine (CLARITIN) 10 MG tablet Take by mouth. 06/18/21 06/18/22 Yes [provider]  metoCLOPramide (REGLAN) 10 MG tablet Take 1 tablet (10 mg total) by mouth 2 (two) times daily as needed (Migraine headaches). 01/23/21  Yes Placido Sou, PA-C  Diclofenac Sodium (PENNSAID) 2  % SOLN Apply 2 g topically 2 (two) times daily as needed (to affected area). 04/04/21   Tarry Kos, MD  amoxicillin-clarithromycin-lansoprazole [facility]) combo pack Take by mouth 2 (two) times daily. Follow package directions. 12/11/11 12/31/11  Louis Meckel, MD    Current Outpatient Medications  Medication Sig Dispense Refill   ciprofloxacin (CILOXAN) 0.3 % ophthalmic solution Administer 1 drop, every 2 hours, while awake, for 2 days. Then 1 drop, every 4 hours, while awake, for the next 5 days.     cycloSPORINE (RESTASIS) 0.05 % ophthalmic emulsion 1 drop 2 (two) times a day.     dicyclomine (BENTYL) 20 MG tablet Take 1 tablet (20 mg total) by mouth every 6 (six) hours. 30 tablet 1   fluticasone (FLONASE) 50 MCG/ACT nasal spray Place into the nose.     loratadine (CLARITIN) 10 MG tablet Take by mouth.     metoCLOPramide (REGLAN) 10 MG tablet Take 1 tablet (10 mg total) by mouth 2 (two) times daily as needed (Migraine headaches). 10 tablet 0   Diclofenac Sodium (PENNSAID) 2 % SOLN Apply 2 g topically 2 (two) times daily as needed (to affected area). 112 g 3   Current Facility-Administered Medications  Medication Dose Route Frequency Provider Last Rate Last Admin   0.9 %  sodium chloride infusion  500 mL Intravenous Once Jenel Lucks, MD        Allergies as of  08/07/2021 - Review Complete 08/07/2021  Allergen Reaction Noted   Latex Other (See Comments) 04/26/2011    Family History  Problem Relation Age of Onset   Cirrhosis Mother    Throat cancer Maternal Aunt    Diabetes Paternal Grandmother    Colon cancer Neg Hx    Rectal cancer Neg Hx    Stomach cancer Neg Hx     Social History   Socioeconomic History   Marital status: Single    Spouse name: Not on file   Number of children: 4   Years of education: Not on file   Highest education level: Not on file  Occupational History   Occupation: HOUSEKEEPING    Employer: [employer]  Tobacco Use   Smoking status:  Every Day    Packs/day: 0.15    Years: 19.00    Pack years: 2.85    Types: Cigarettes   Smokeless tobacco: Never  Vaping Use   Vaping Use: Never used  Substance and Sexual Activity   Alcohol use: No   Drug use: No   Sexual activity: Yes    Birth control/protection: Surgical  Other Topics Concern   Not on file  Social History Narrative   Not on file   Social Determinants of Health   Financial Resource Strain: Not on file  Food Insecurity: Not on file  Transportation Needs: Not on file  Physical Activity: Not on file  Stress: Not on file  Social Connections: Not on file  Intimate Partner Violence: Not on file    Review of Systems:  All other review of systems negative except as mentioned in the HPI.  Physical Exam: Vital signs BP 121/84   Pulse 89   Temp (!) 96.8 F (36 C) (Temporal)   Resp 15   Ht 5\' 5"  (1.651 m)   Wt 208 lb (94.3 kg)   SpO2 100%   BMI 34.61 kg/m   General:   Alert,  Well-developed, well-nourished, pleasant and cooperative in NAD Lungs:  Clear throughout to auscultation.   Heart:  Regular rate and rhythm; no murmurs, clicks, rubs,  or gallops. Abdomen:  Soft, nontender and nondistended. Normal bowel sounds.   Neuro/Psych:  Normal mood and affect. A and O x 3   Stacie Carrillo E. , MD [facility]

## 2021-08-07 NOTE — Progress Notes (Signed)
Called to room to assist during endoscopic procedure.  Patient ID and intended procedure confirmed with present staff. Received instructions for my participation in the procedure from the performing physician.  

## 2021-08-07 NOTE — Op Note (Signed)
[facility] Patient Name: Stacie Carrillo Procedure Date: 08/07/2021 10:44 AM MRN: [id number] Endoscopist: Lorin Picket E. Tomasa Rand , MD Age: 40 Referring MD:  Date of Birth: 1981-08-28 Gender: Female Account #: [id number] Procedure:                Upper GI endoscopy Indications:              Abdominal pain, Dysphagia Medicines:                Monitored Anesthesia Care Procedure:                Pre-Anesthesia Assessment:                           - Prior to the procedure, a History and Physical                            was performed, and patient medications and                            allergies were reviewed. The patient's tolerance of                            previous anesthesia was also reviewed. The risks                            and benefits of the procedure and the sedation                            options and risks were discussed with the patient.                            All questions were answered, and informed consent                            was obtained. Prior Anticoagulants: The patient has                            taken no previous anticoagulant or antiplatelet                            agents. ASA Grade Assessment: II - A patient with                            mild systemic disease. After reviewing the risks                            and benefits, the patient was deemed in                            satisfactory condition to undergo the procedure.                           After obtaining informed consent, the endoscope was  passed under direct vision. Throughout the                            procedure, the patient's blood pressure, pulse, and                            oxygen saturations were monitored continuously. The                            GIF [id number] #[id number] was introduced through the                            mouth, and advanced to the third part of duodenum.                            The upper GI endoscopy  was accomplished without                            difficulty. The patient tolerated the procedure                            well. Scope In: Scope Out: Findings:                 The examined portions of the nasopharynx,                            oropharynx and larynx were normal.                           The examined esophagus was normal. A TTS dilator                            was passed through the scope. Dilation with an                            18-19-20 mm balloon dilator was performed to 20 mm.                            The dilation site was examined and showed no                            change. Estimated blood loss: none.                           A deformity was found in the prepyloric region of                            the stomach with associated erythema. Biopsies were                            taken with a cold forceps for Helicobacter pylori  testing. Estimated blood loss was minimal.                           The exam of the stomach was otherwise normal.                           The examined duodenum was normal. Complications:            No immediate complications. Estimated Blood Loss:     Estimated blood loss was minimal. Impression:               - The examined portions of the nasopharynx,                            oropharynx and larynx were normal.                           - Normal esophagus. Dilated. No appreciable                            stenosis and no mucosal rent produced with dilation                            to 16mm.                           - Acquired deformity in the prepyloric region of                            the stomach. Biopsied. Suspect this is related to a                            history of gastric ulcer                           - Normal examined duodenum. Recommendation:           - Patient has a contact number available for                            emergencies. The signs and symptoms of potential                             delayed complications were discussed with the                            patient. Return to normal activities tomorrow.                            Written discharge instructions were provided to the                            patient.                           - Resume previous diet.                           -  Continue present medications.                           - Await pathology results.                           - Follow up as needed with Dr. Tomasa Rand in clinic                            for ongoing symptoms. Andriel Omalley E. Tomasa Rand, MD 08/07/2021 11:12:13 AM This report has been signed electronically.

## 2021-08-07 NOTE — Progress Notes (Signed)
VS completed by DT.   Medical and surgical history reviewed and updated.  

## 2021-08-11 ENCOUNTER — Telehealth: Payer: Self-pay

## 2021-08-11 NOTE — Telephone Encounter (Signed)
No answer, left message to call back later today, B.Makailah Slavick RN. 

## 2021-08-11 NOTE — Telephone Encounter (Signed)
  Follow up Call-  Call back number 08/07/2021  Post procedure Call Back phone  # [phone]  Permission to leave phone message Yes  Some recent data might be hidden     Patient questions:  Do you have a fever, pain , or abdominal swelling? No. Pain Score  0 *  Have you tolerated food without any problems? Yes.    Have you been able to return to your normal activities? Yes.    Do you have any questions about your discharge instructions: Diet   No. Medications  No. Follow up visit  No.  Do you have questions or concerns about your Care? No.  Actions: * If pain score is 4 or above: No action needed, pain <4.

## 2021-08-14 ENCOUNTER — Encounter: Payer: Self-pay | Admitting: Gastroenterology

## 2021-08-14 NOTE — Progress Notes (Signed)
Stacie Carrillo,  The biopsies taken from your stomach were notable for mild chronic gastritis (inflammation) which is a common finding, but there was no evidence of Helicobacter pylori infection. This common finding is not likely to explain abdominal pain and there is no specific treatment or further evaluation recommended.  Please follow up with me as needed in the clinic for further evaluation and management of ongoing symptoms.

## 2021-09-04 ENCOUNTER — Ambulatory Visit (INDEPENDENT_AMBULATORY_CARE_PROVIDER_SITE_OTHER): Payer: BC Managed Care – PPO

## 2021-09-04 ENCOUNTER — Other Ambulatory Visit: Payer: Self-pay

## 2021-09-04 ENCOUNTER — Ambulatory Visit (INDEPENDENT_AMBULATORY_CARE_PROVIDER_SITE_OTHER): Payer: BC Managed Care – PPO | Admitting: Podiatry

## 2021-09-04 ENCOUNTER — Encounter: Payer: Self-pay | Admitting: Podiatry

## 2021-09-04 DIAGNOSIS — M2011 Hallux valgus (acquired), right foot: Secondary | ICD-10-CM

## 2021-09-07 NOTE — Progress Notes (Signed)
Subjective:  Patient ID: Stacie Carrillo, female    DOB: 25-Mar-1981,  MRN: [id number] HPI Chief Complaint  Patient presents with   Foot Pain    1st MPJ right - bunion deformity x years, aching x couple months, switched careers and now wearing dress/casual shoes more, redness   Toe Pain    Hallux right - medial border, ingrown     40 y.o. female presents with the above complaint.   ROS: She denies fever chills nausea vomiting muscle aches pains calf pain back pain chest pain shortness of breath.  Past Medical History:  Diagnosis Date   Anemia    Anxiety    Depression    Diabetes mellitus without complication ([place])    Obesity    Polycystic ovarian syndrome    Reflux    Ulcer    Past Surgical History:  Procedure Laterality Date   CESAREAN SECTION     x4   IRRIGATION AND DEBRIDEMENT ABSCESS Right 10/09/2013   Procedure: IRRIGATION AND DEBRIDEMENT RIGHT AXILLARY ABSCESS;  Surgeon: Shann Medal, MD;  Location: [facility];  Service: General;  Laterality: Right;   TUBAL LIGATION     UPPER GASTROINTESTINAL ENDOSCOPY      Current Outpatient Medications:    ciprofloxacin (CILOXAN) 0.3 % ophthalmic solution, Administer 1 drop, every 2 hours, while awake, for 2 days. Then 1 drop, every 4 hours, while awake, for the next 5 days., Disp: , Rfl:    cycloSPORINE (RESTASIS) 0.05 % ophthalmic emulsion, 1 drop 2 (two) times a day., Disp: , Rfl:    Diclofenac Sodium (PENNSAID) 2 % SOLN, Apply 2 g topically 2 (two) times daily as needed (to affected area)., Disp: 112 g, Rfl: 3   dicyclomine (BENTYL) 20 MG tablet, Take 1 tablet (20 mg total) by mouth every 6 (six) hours., Disp: 30 tablet, Rfl: 1   fluticasone (FLONASE) 50 MCG/ACT nasal spray, Place into the nose., Disp: , Rfl:    loratadine (CLARITIN) 10 MG tablet, Take by mouth., Disp: , Rfl:    metoCLOPramide (REGLAN) 10 MG tablet, Take 1 tablet (10 mg total) by mouth 2 (two) times daily as needed (Migraine headaches)., Disp: 10 tablet, Rfl:  0  Allergies  Allergen Reactions   Nsaids     Other reaction(s): GI intolerance, Other Hx of PUD Hx of PUD    Latex Other (See Comments)    Pt reports "eczema" with latex use   Review of Systems Objective:  There were no vitals filed for this visit.  General: Well developed, nourished, in no acute distress, alert and oriented x3   Dermatological: Skin is warm, dry and supple bilateral. Nails x 10 are well maintained; remaining integument appears unremarkable at this time. There are no open sores, no preulcerative lesions, no rash or signs of infection present.  Painful ingrown nail border tibial and fibular hallux right no erythema cellulitis drainage or odor no signs of infection.  Vascular: Dorsalis Pedis artery and Posterior Tibial artery pedal pulses are 2/4 bilateral with immedate capillary fill time. Pedal hair growth present. No varicosities and no lower extremity edema present bilateral.   Neruologic: Grossly intact via light touch bilateral. Vibratory intact via tuning fork bilateral. Protective threshold with Semmes Wienstein monofilament intact to all pedal sites bilateral. Patellar and Achilles deep tendon reflexes 2+ bilateral. No Babinski or clonus noted bilateral.   Musculoskeletal: No gross boney pedal deformities bilateral. No pain, crepitus, or limitation noted with foot and ankle range of motion bilateral. Muscular strength  5/5 in all groups tested bilateral.  Gait: Unassisted, Nonantalgic.    Radiographs:  Radiographs of the right foot demonstrate an osseously mature individual today.  No acute findings are identified.  However there is an increase in the first intermetatarsal angle with a hallux abductus angle increase and a hypertrophic medial condyle to the head of the first metatarsal resulting in hallux abductovalgus deformity.  She also has a significantly elongated second metatarsal with no significant hammertoe deformity as of yet.  Assessment & Plan:    Assessment: Hallux abductovalgus deformity painful in nature right plantarflexed second metatarsal right  Ingrown nail tibial fibular border hallux right  Plan: Discussed etiology pathology conservative versus surgical therapies.  At this point we discussed in great detail the pros and cons of surgical correction.  She would like to consider this.  She states is been going on for years and is now more painful since she has to wear dress shoes.  She would like to go ahead and have it fixed.  We consented her today for an [facility] bunion repair with a second metatarsal osteotomy.  We also consented her for surgical excision tibiofibular nail border with matrixectomy hallux right.  We discussed in great detail today the possible side effects and consequences associated with surgery which may include but not limited to postop pain bleeding swelling infection recurrence need for further surgery overcorrection under correction loss of digit loss of limb loss of life.  We provided her with information regarding the surgery center and the anesthesia group.  Provided her with a cam walker today and she was met by one of our scheduling representatives to plan her surgical procedure.     Lacrecia Delval T. [place], [place]

## 2021-09-09 ENCOUNTER — Telehealth: Payer: Self-pay | Admitting: Urology

## 2021-09-09 NOTE — Telephone Encounter (Signed)
DOS - 09/19/21  [facility] BUNIONECTOMY RIGHT --- 96283 METATARSAL OSTEOTOMY 2ND RIGHT --- 66294 EXC NAIL PERM 1ST RIGHT --- 11750  [employer] EFFECTIVE DATE - 10/19/20   PLAN DEDUCTIBLE - $2,000.00 W/ $0.00 REMAINING OUT OF POCKET - $4,000.00 W/ $1,312.73 REMAINING COINSURANCE - 20% COPAY - $0.00   NO PRIOR AUTH REQUIRED

## 2021-09-10 DIAGNOSIS — M79676 Pain in unspecified toe(s): Secondary | ICD-10-CM

## 2021-09-17 ENCOUNTER — Other Ambulatory Visit: Payer: Self-pay | Admitting: Podiatry

## 2021-09-17 ENCOUNTER — Telehealth: Payer: Self-pay | Admitting: *Deleted

## 2021-09-17 MED ORDER — OXYCODONE-ACETAMINOPHEN 10-325 MG PO TABS: 1.0000 | ORAL_TABLET | Freq: Three times a day (TID) | ORAL | 0 refills | Status: AC | PRN

## 2021-09-17 MED ORDER — ONDANSETRON HCL 4 MG PO TABS: 4.0000 mg | ORAL_TABLET | Freq: Three times a day (TID) | ORAL | 0 refills | Status: DC | PRN

## 2021-09-17 MED ORDER — CEPHALEXIN 500 MG PO CAPS: 500.0000 mg | ORAL_CAPSULE | Freq: Three times a day (TID) | ORAL | 0 refills | Status: DC

## 2021-09-17 NOTE — Telephone Encounter (Signed)
[facility]([phone]),calling to clarify reason for pain medicine, if post surgery, can issue for 7 days, otherwise will have to limit to 5 days. Please advise.

## 2021-09-18 NOTE — Telephone Encounter (Signed)
Returned the call to pharmacy, giving information for approval of the 7 day pain medicine per Dr Geraldo Pitter surgery(DOS:11/20/20).

## 2021-09-19 DIAGNOSIS — M2011 Hallux valgus (acquired), right foot: Secondary | ICD-10-CM | POA: Diagnosis not present

## 2021-09-19 DIAGNOSIS — L600 Ingrowing nail: Secondary | ICD-10-CM | POA: Diagnosis not present

## 2021-09-19 DIAGNOSIS — M25571 Pain in right ankle and joints of right foot: Secondary | ICD-10-CM | POA: Diagnosis not present

## 2021-09-19 DIAGNOSIS — M205X1 Other deformities of toe(s) (acquired), right foot: Secondary | ICD-10-CM | POA: Diagnosis not present

## 2021-09-19 DIAGNOSIS — M21541 Acquired clubfoot, right foot: Secondary | ICD-10-CM | POA: Diagnosis not present

## 2021-09-19 HISTORY — PX: HALLUX VALGUS CORRECTION: SUR315

## 2021-09-22 ENCOUNTER — Encounter: Payer: Self-pay | Admitting: Podiatry

## 2021-09-25 ENCOUNTER — Ambulatory Visit (INDEPENDENT_AMBULATORY_CARE_PROVIDER_SITE_OTHER): Payer: BC Managed Care – PPO

## 2021-09-25 ENCOUNTER — Encounter: Payer: Self-pay | Admitting: Podiatrist

## 2021-09-25 ENCOUNTER — Other Ambulatory Visit: Payer: Self-pay

## 2021-09-25 ENCOUNTER — Ambulatory Visit (INDEPENDENT_AMBULATORY_CARE_PROVIDER_SITE_OTHER): Payer: BC Managed Care – PPO | Admitting: Podiatrist

## 2021-09-25 DIAGNOSIS — L600 Ingrowing nail: Secondary | ICD-10-CM

## 2021-09-25 DIAGNOSIS — M2011 Hallux valgus (acquired), right foot: Secondary | ICD-10-CM

## 2021-09-25 DIAGNOSIS — M21961 Unspecified acquired deformity of right lower leg: Secondary | ICD-10-CM

## 2021-09-25 DIAGNOSIS — Z9889 Other specified postprocedural states: Secondary | ICD-10-CM

## 2021-09-25 MED ORDER — OXYCODONE-ACETAMINOPHEN 5-325 MG PO TABS: 1.0000 | ORAL_TABLET | Freq: Four times a day (QID) | ORAL | 0 refills | Status: AC | PRN

## 2021-09-25 NOTE — Progress Notes (Signed)
Chief Complaint  Patient presents with   Routine Post Op    POV #1 DOS 09/19/2021 [facility] BUNION REPAIR, 2ND METATARSAL OSTEOTOMY, SURGICAL PARTIAL MATRIXECTOMY 1ST RT  "Its been really sore, but I haven't been on it and wearing the boot"     Subjective: Patient presents today1 week status post foot surgery of the right foot as stated above.    Patient denies nausea, vomiting, fevers, chills or night sweats.  Denies calf pain or tenderness to the operative side. She has been wearing her boot at all times.    Objective:  Neurovascular status is intact with palpable pedal pulses DP and PT at 2+ out of 4 right foot.  Negative homans sign noted.  Neurological sensation is intact and unchanged as per prior to surgery. Excellent appearance of the postoperative foot is noted.  Dressing is intact upon presenting to the office and once removed sutures are noted to be in place with incision sites well coapted.  Minimal swelling is present.  No redness, swelling, or drainage is noted. No sign of infection seen.   Xrays show a post operative bunion repair with screw fixation and second metatarsal osteotomy with screw fixation.  Good alignment and position noted at both sites.  Excellent post operative appearance is noted.   Assessment:  1. Status post right foot surgery   2. Acquired hallux valgus of right foot   3. Metatarsal deformity, right   4. Ingrown right big toenail      Plan:   -I redressed the foot with a dry sterile and compressive dressing. - she may remove the boot at night to sleep however she must wear the boot when she attempts to put any weight on the foot of which she understands and accepts.  She will keep the dressing intact as well. - I refilled her pain medication with Oxycodone 5/325 as she stated the 10/325 was a bit too strong for her to take.   - she will be seen back with Dr. Al Corpus for her next appointment and will call in the meantime if she has any questions or concerns.

## 2021-10-02 ENCOUNTER — Encounter: Payer: Self-pay | Admitting: Podiatry

## 2021-10-02 ENCOUNTER — Other Ambulatory Visit: Payer: Self-pay

## 2021-10-02 ENCOUNTER — Ambulatory Visit (INDEPENDENT_AMBULATORY_CARE_PROVIDER_SITE_OTHER): Payer: BC Managed Care – PPO | Admitting: Podiatry

## 2021-10-02 DIAGNOSIS — L600 Ingrowing nail: Secondary | ICD-10-CM

## 2021-10-02 DIAGNOSIS — M21961 Unspecified acquired deformity of right lower leg: Secondary | ICD-10-CM

## 2021-10-02 DIAGNOSIS — M2011 Hallux valgus (acquired), right foot: Secondary | ICD-10-CM

## 2021-10-02 DIAGNOSIS — Z9889 Other specified postprocedural states: Secondary | ICD-10-CM

## 2021-10-04 NOTE — Progress Notes (Signed)
She presents today for her second postop visit date of surgery is 09/19/2021 states that it has been really sore this week.  She does admit to being on it more.  Denies any trauma to the foot since surgery.  Objective: Vital signs are stable alert oriented x3.  Pulses are palpable she still has moderate edema no erythema cellulitis drainage or odor.  Sutures are intact to the matrixectomy sites as well as the primary surgical site.  She has good range of motion.  Incision site still demonstrates not a completely healed wound.  Most likely this is due to the edema.  So we will leave the stitches in for another week or so.  Assessment status post matrixectomy and bunionectomy.  Plan: Redressed today compression encouraged range of motion exercises follow-up with her in 1 to 2 weeks for suture removal

## 2021-10-16 ENCOUNTER — Encounter: Payer: Self-pay | Admitting: Podiatry

## 2021-10-16 ENCOUNTER — Other Ambulatory Visit: Payer: Self-pay

## 2021-10-16 ENCOUNTER — Ambulatory Visit (INDEPENDENT_AMBULATORY_CARE_PROVIDER_SITE_OTHER): Payer: BC Managed Care – PPO | Admitting: Podiatry

## 2021-10-16 DIAGNOSIS — M2011 Hallux valgus (acquired), right foot: Secondary | ICD-10-CM | POA: Diagnosis not present

## 2021-10-16 DIAGNOSIS — Z9889 Other specified postprocedural states: Secondary | ICD-10-CM

## 2021-10-16 DIAGNOSIS — L600 Ingrowing nail: Secondary | ICD-10-CM

## 2021-10-16 DIAGNOSIS — M21961 Unspecified acquired deformity of right lower leg: Secondary | ICD-10-CM

## 2021-10-16 MED ORDER — GABAPENTIN 100 MG PO CAPS: 100.0000 mg | ORAL_CAPSULE | Freq: Every day | ORAL | 1 refills | Status: DC

## 2021-10-16 NOTE — Progress Notes (Signed)
She presents today date of surgery 09/19/2021 [facility] bunion repair second met osteotomy and partial matrixectomy's hallux right.  States that is really just hurts at nighttime.  Objective: Vital signs are stable alert and oriented x3.  Pulses are palpable.  There is no erythema edema cellulitis drainage or odor.  She is got some eschar that is present I removed all of the sutures today.  She got good range of motion.  Assessment: Well-healing surgical foot.  Plan: Redress with a compression anklet and she will continue use her Darco shoe at home I will follow-up with her in 2 weeks.  I am going to send over some gabapentin for her nighttime pain.

## 2021-10-30 ENCOUNTER — Encounter: Payer: Self-pay | Admitting: Podiatry

## 2021-10-30 ENCOUNTER — Ambulatory Visit (INDEPENDENT_AMBULATORY_CARE_PROVIDER_SITE_OTHER): Payer: BC Managed Care – PPO | Admitting: Podiatry

## 2021-10-30 ENCOUNTER — Other Ambulatory Visit: Payer: Self-pay

## 2021-10-30 ENCOUNTER — Ambulatory Visit (INDEPENDENT_AMBULATORY_CARE_PROVIDER_SITE_OTHER): Payer: BC Managed Care – PPO

## 2021-10-30 DIAGNOSIS — M2011 Hallux valgus (acquired), right foot: Secondary | ICD-10-CM

## 2021-10-30 DIAGNOSIS — M21961 Unspecified acquired deformity of right lower leg: Secondary | ICD-10-CM

## 2021-10-30 DIAGNOSIS — L600 Ingrowing nail: Secondary | ICD-10-CM

## 2021-10-30 DIAGNOSIS — Z9889 Other specified postprocedural states: Secondary | ICD-10-CM

## 2021-10-30 NOTE — Progress Notes (Signed)
She presents today date of surgery 09/19/2021 status post [facility] bunion repair right foot with second metatarsal osteotomy and hammertoe matrixectomy.  She states is feeling good and ready to go back to work at least part-time.  She denies fever chills nausea run muscle aches pains calf pain back pain chest pain shortness of breath.  Objective: Vital signs stable alert oriented x3 much decrease in edema to the right foot that she does have limited dorsiflexion plantarflexion of the first metatarsophalangeal joint right.  Radiographs taken today demonstrate well-healing osteotomies first and second internal fixation is in good position with good compression.  Assessment: Well-healing surgical foot right.  Plan: We will allow her to get back to work 4 hours a day she will utilize a knee scooter when if it starts to become swollen and painful.  We will allow her to try to get back into a regular tennis shoe that she can wear her Darco shoe to work.  We will follow-up with her in about 1 month for another set of x-rays should she need me she will call sooner.  She will follow-up with physical therapy immediately.  This should help increase her range of motion and decrease some scar tissue.

## 2021-10-31 ENCOUNTER — Encounter: Payer: Self-pay | Admitting: Podiatry

## 2021-11-11 ENCOUNTER — Encounter: Payer: Self-pay | Admitting: Physical Therapy

## 2021-11-11 ENCOUNTER — Ambulatory Visit: Payer: BC Managed Care – PPO | Attending: Podiatry | Admitting: Physical Therapy

## 2021-11-11 ENCOUNTER — Other Ambulatory Visit: Payer: Self-pay

## 2021-11-11 DIAGNOSIS — M21961 Unspecified acquired deformity of right lower leg: Secondary | ICD-10-CM | POA: Diagnosis not present

## 2021-11-11 DIAGNOSIS — M6281 Muscle weakness (generalized): Secondary | ICD-10-CM | POA: Diagnosis not present

## 2021-11-11 DIAGNOSIS — M2011 Hallux valgus (acquired), right foot: Secondary | ICD-10-CM | POA: Diagnosis not present

## 2021-11-11 DIAGNOSIS — M25571 Pain in right ankle and joints of right foot: Secondary | ICD-10-CM | POA: Insufficient documentation

## 2021-11-11 DIAGNOSIS — R2689 Other abnormalities of gait and mobility: Secondary | ICD-10-CM | POA: Diagnosis not present

## 2021-11-11 DIAGNOSIS — Z9889 Other specified postprocedural states: Secondary | ICD-10-CM | POA: Diagnosis not present

## 2021-11-11 NOTE — Therapy (Signed)
OUTPATIENT PHYSICAL THERAPY LOWER EXTREMITY EVALUATION   Patient Name: Stacie Carrillo MRN: [id number] DOB:03/21/1981, 41 y.o., female Today's Date: 11/11/2021   PT End of Session - 11/11/21 1418     Visit Number 1    Number of Visits 17    Date for PT Re-Evaluation 01/06/22    PT Start Time 1418    PT Stop Time 1500    PT Time Calculation (min) 42 min    Activity Tolerance Patient tolerated treatment well    Behavior During Therapy WFL for tasks assessed/performed             Past Medical History:  Diagnosis Date   Anemia    Anxiety    Depression    Diabetes mellitus without complication ([place])    Obesity    Polycystic ovarian syndrome    Reflux    Ulcer    Past Surgical History:  Procedure Laterality Date   boil removal  2014   CESAREAN SECTION     x4   HALLUX VALGUS CORRECTION Right 09/19/2021   IRRIGATION AND DEBRIDEMENT ABSCESS Right 10/09/2013   Procedure: IRRIGATION AND DEBRIDEMENT RIGHT AXILLARY ABSCESS;  Surgeon: Shann Medal, MD;  Location: [facility];  Service: General;  Laterality: Right;   TUBAL LIGATION     UPPER GASTROINTESTINAL ENDOSCOPY     Patient Active Problem List   Diagnosis Date Noted   Migraine without aura and without status migrainosus, not intractable 01/28/2021   Ulcer, stomach peptic, chronic 01/28/2021   Abscess of axilla, right 10/10/2013   Hydradenitis 10/08/2013   Cellulitis of axilla, right 10/08/2013   Cellulitis 10/08/2013   Stricture and stenosis of esophagus 12/31/2011   Esophageal reflux 11/24/2011   Anemia 11/24/2011   POLYCYSTIC OVARY 12/16/2006   OBESITY, NOS 12/16/2006   TOBACCO DEPENDENCE 12/16/2006    PCP: Delton Prairie, PA-C  REFERRING PROVIDER: Garrel Ridgel, DPM  REFERRING DIAG: Acquired hallux valgus of right foot [M20.11], Metatarsal deformity, right [M21.961], Status post right foot surgery [Z98.890]  THERAPY DIAG:  Pain in right ankle and joints of right foot  Muscle weakness  (generalized)  Other abnormalities of gait and mobility  ONSET DATE: s/p bunion repair and 2nd met osteotomy right foot, DOS 09/19/2021  SUBJECTIVE:   SUBJECTIVE STATEMENT: Pt reports overall everything has been going well following the surgery. She reports her biggest issue is at night it feels like her nerves regenerating. Pt reports biggest issue is swelling. Only precaution MD gave her was to avoid bending, or going up onto her toes. She currently works part time per the Md precautions.   PERTINENT HISTORY: Hx of anxiety/ depresison, anemia  PAIN:  Are you having pain? Yes NPRS scale: 3/10 Pain location: great toe Pain orientation: Right  PAIN TYPE: aching and sore Aggravating factors: laying down at night, prolonged sitting, standing, walking Relieving factors: ibuprofen and elevation,   PRECAUTIONS: Other: avoid flexing the toe, or raising up onto her toes  w  WEIGHT BEARING RESTRICTIONS No  FALLS:  Has patient fallen in last 6 months? No, Number of falls: 0  LIVING ENVIRONMENT: Lives with: lives with their family Lives in: House/apartment Stairs: No;  Has following equipment at home:  scooter, and walking shoe, tall cam boot,  OCCUPATION: house Patent examiner, job requiringment a little standing, sitting, walking, lifting   PLOF: Independent with basic ADLs  PATIENT GOALS :  Help toes heal.    OBJECTIVE:   DIAGNOSTIC FINDINGS: at [facility] office  PATIENT  SURVEYS:  FOTO 55%, precited 72%  COGNITION:  Overall cognitive status: Within functional limits for tasks assessed     SENSATION:  Light touch: Appears intact    POSTURE:  WNL  PALPATION: TTP along the 1st and 2nd MTP  LE AROM/PROM:  A/PROM Right 11/11/2021 Left 11/11/2021  Hip flexion    Hip extension    Hip abduction    Hip adduction    Hip internal rotation    Hip external rotation    Knee flexion    Knee extension    Ankle dorsiflexion 0/ 10   Ankle plantarflexion 50   Ankle  inversion 20   Ankle eversion 14   Great toe extension 0/35    (Blank rows = not tested)  LE MMT:  MMT Right 11/11/2021 Left 11/11/2021  Hip flexion    Hip extension    Hip abduction    Hip adduction    Hip internal rotation    Hip external rotation    Knee flexion    Knee extension    Ankle dorsiflexion 4/5 5/5  Ankle plantarflexion 4+/5 5/5  Ankle inversion 4/5   Ankle eversion 4/5    (Blank rows = not tested)   FUNCTIONAL TESTS:  5 times sit to stand: 12seconds  GAIT: Assistive device utilized:  walking shoe Level of assistance: Modified independence Comments: antalgice gait pattern with limited step length on LLE, and stance on RLE     TODAY'S TREATMENT:    PATIENT EDUCATION:  Education details: evaulation findings, POC, goals, HEP Person educated: Patient Education method: Explanation Education comprehension: verbalized understanding   HOME EXERCISE PROGRAM: Access Code: [email or web address] URL: [email or web address] Date: 11/11/2021 Prepared by: Starr [place]  Exercises Seated Calf Stretch with Strap - 1 x daily - 7 x weekly - 2 sets - 2 reps - 30 hold Staggered Stance Forward Backward Weight Shift with Counter Support - 1 x daily - 7 x weekly - 2 sets - 10 reps Staggered Stance Forward Backward Weight Shift with Unilateral Counter Support - 1 x daily - 7 x weekly - 2 sets - 10 reps Seated Heel Raise - 1 x daily - 7 x weekly - 2 sets - 10 reps Ankle Inversion Eversion Towel Slide - 1 x daily - 7 x weekly - 2 sets - 10 reps   ASSESSMENT:  CLINICAL IMPRESSION: Patient is a 40 y.o. F who was seen today for physical therapy evaluation and treatment for s/p R hallux valgus correction, and 2nd metatarsal osteotomy. She currently utilizes a walking boot, demonstrating antalgic gait pattern with decreased stride.  She has limited ankle DF and 1st digit extension, and gen weakness in the R ankle/ foot. TTP along the R first and second MTP, andObjective  impairments include Abnormal gait, decreased activity tolerance, decreased balance, decreased endurance, decreased ROM, decreased strength, increased edema, increased fascial restrictions, and pain. These impairments are limiting patient from cleaning, community activity, driving, occupation, and shopping. Personal factors including 1-2 comorbidities: hx of anxiety, depression  are also affecting patient's functional outcome. Patient will benefit from skilled PT to address above impairments and improve overall function.  REHAB POTENTIAL: Good  CLINICAL DECISION MAKING: Evolving/moderate complexity  EVALUATION COMPLEXITY: Moderate   GOALS: Goals reviewed with patient? Yes  SHORT TERM GOALS:  STG Name Target Date Goal status  1 Pt to be IND with initial HEP for therapeutic progression Baseline:  12/09/2021 INITIAL  2 Pt to be able to verbalize/ demo heel strike/ toe off without using a  walking boot with </= 4/10 max pain Baseline:  12/09/2021 INITIAL   LONG TERM GOALS:   LTG Name Target Date Goal status  1 Increase ankle DF to >/= 10 degrees to promote efficeiint gait pattern  Baseline: 01/06/2022 INITIAL  2 Increase great toe extension AROM to >/= 20 degrees to assist with toe off phase of gate Baseline: 01/06/2022 INITIAL  3 Increase gross ankle strength to >/= 5/5 to promote stability with standing/ walking  Baseline: 01/06/2022 INITIAL  4 Pt to be able to walk/ stand for >/= 60 min with no report of pain or limtations for endurance required for work related tasks.  Baseline: 01/06/2022 INITIAL  5 Increase FOTO score to >/= 72% to demo improvement in function Baseline: 01/06/2022 INITIAL  6 Pt to be IND with all HEP and is able to maintain and progress current LOF IND 01/06/2022  INITIAL   PLAN: PT FREQUENCY: 1-2x/week  PT DURATION: 8 weeks  PLANNED INTERVENTIONS: Therapeutic exercises, Therapeutic activity, Neuro Muscular re-education, Balance training, Gait training,  Patient/Family education, Joint mobilization, Stair training, Aquatic Therapy, Dry Needling, Cryotherapy, Moist heat, Taping, Vasopneumatic device, Ultrasound, Ionotophoresis 78m/ml Dexamethasone, and Manual therapy  PLAN FOR NEXT SESSION: Review/ update HEP PRN, practice gait training, general ankle strengthening, gentle great toe extension.   Charrise Lardner PT, DPT, LAT, ATC  11/11/21  4:09 PM

## 2021-11-19 ENCOUNTER — Other Ambulatory Visit: Payer: Self-pay

## 2021-11-19 ENCOUNTER — Ambulatory Visit: Payer: BC Managed Care – PPO | Attending: Podiatry

## 2021-11-19 DIAGNOSIS — R2689 Other abnormalities of gait and mobility: Secondary | ICD-10-CM | POA: Diagnosis not present

## 2021-11-19 DIAGNOSIS — M25571 Pain in right ankle and joints of right foot: Secondary | ICD-10-CM | POA: Diagnosis not present

## 2021-11-19 DIAGNOSIS — M6281 Muscle weakness (generalized): Secondary | ICD-10-CM | POA: Insufficient documentation

## 2021-11-19 NOTE — Therapy (Signed)
OUTPATIENT PHYSICAL THERAPY TREATMENT NOTE   Patient Name: Stacie Carrillo MRN: [id number] DOB:[birth year], 41 y.o., female Today's Date: 11/19/2021  PCP: Delton Prairie, PA-C REFERRING PROVIDER: Garrel Ridgel, DPM   PT End of Session - 11/19/21 1446     Visit Number 2    Number of Visits 17    Date for PT Re-Evaluation 01/06/22    PT Start Time 1446    PT Stop Time 1524    PT Time Calculation (min) 38 min    Activity Tolerance Patient tolerated treatment well    Behavior During Therapy WFL for tasks assessed/performed             Past Medical History:  Diagnosis Date   Anemia    Anxiety    Depression    Diabetes mellitus without complication ([place])    Obesity    Polycystic ovarian syndrome    Reflux    Ulcer    Past Surgical History:  Procedure Laterality Date   boil removal  2014   CESAREAN SECTION     x4   HALLUX VALGUS CORRECTION Right 09/19/2021   IRRIGATION AND DEBRIDEMENT ABSCESS Right 10/09/2013   Procedure: IRRIGATION AND DEBRIDEMENT RIGHT AXILLARY ABSCESS;  Surgeon: Shann Medal, MD;  Location: [facility];  Service: General;  Laterality: Right;   TUBAL LIGATION     UPPER GASTROINTESTINAL ENDOSCOPY     Patient Active Problem List   Diagnosis Date Noted   Migraine without aura and without status migrainosus, not intractable 01/28/2021   Ulcer, stomach peptic, chronic 01/28/2021   Abscess of axilla, right 10/10/2013   Hydradenitis 10/08/2013   Cellulitis of axilla, right 10/08/2013   Cellulitis 10/08/2013   Stricture and stenosis of esophagus 12/31/2011   Esophageal reflux 11/24/2011   Anemia 11/24/2011   POLYCYSTIC OVARY 12/16/2006   OBESITY, NOS 12/16/2006   TOBACCO DEPENDENCE 12/16/2006    REFERRING DIAG:  Acquired hallux valgus of right foot [M20.11], Metatarsal deformity, right [M21.961], Status post right foot surgery [Z98.890]  THERAPY DIAG:  Pain in right ankle and joints of right foot  Muscle weakness (generalized)  Other  abnormalities of gait and mobility  PERTINENT HISTORY:  s/p bunion repair and 2nd met osteotomy right foot, DOS 09/19/2021; Hx of anxiety/ depresison, anemia  PRECAUTIONS: None  SUBJECTIVE: Pt presents to PT with reports of continued Rt foot pain. Has been compliant with initial HEP with no adverse effect. Pt is ready to begin PT treatment at this time.   PAIN:  Are you having pain? No NPRS scale: 8/10 Pain location: R foot Pain orientation: Right  PAIN TYPE: aching and sore Aggravating factors: laying down at night, prolonged sitting, standing, walking Relieving factors: ibuprofen and elevation,      OBJECTIVE:    PATIENT SURVEYS:  FOTO 55%, precited 72%   LE AROM/PROM:   A/PROM Right 11/11/2021 Left 11/11/2021  Hip flexion      Hip extension      Hip abduction      Hip adduction      Hip internal rotation      Hip external rotation      Knee flexion      Knee extension      Ankle dorsiflexion 0/ 10    Ankle plantarflexion 50    Ankle inversion 20    Ankle eversion 14    Great toe extension 0/35     (Blank rows = not tested)   LE MMT:   MMT Right 11/11/2021  Left 11/11/2021  Hip flexion      Hip extension      Hip abduction      Hip adduction      Hip internal rotation      Hip external rotation      Knee flexion      Knee extension      Ankle dorsiflexion 4/5 5/5  Ankle plantarflexion 4+/5 5/5  Ankle inversion 4/5    Ankle eversion 4/5     (Blank rows = not tested)     FUNCTIONAL TESTS:  5 times sit to stand: 12seconds   GAIT: Assistive device utilized:  walking shoe Level of assistance: Modified independence Comments: antalgice gait pattern with limited step length on LLE, and stance on RLE        TODAY'S TREATMENT:   [facility] Adult PT Treatment:                                                DATE: 11/19/2021 Therapeutic Exercise: NuStep lvl 5 UE/LE x 4 min while taking subjective Towel crunch 2x45" R seated heel raise 2x15 R calf stretch with  towel 2x30" Towel inv/ev x 5 R ankle DF/PF 2x15 YTB R ankle Inv/Ev YTB 2x10 ea     PATIENT EDUCATION:  Education details: evaulation findings, POC, goals, HEP Person educated: Patient Education method: Explanation Education comprehension: verbalized understanding     HOME EXERCISE PROGRAM: Access Code: [email or web address] URL: [email or web address] Date: 11/19/2021 Prepared by: Octavio Manns  Exercises Staggered Stance Forward Backward Weight Shift with Counter Support - 1 x daily - 7 x weekly - 2 sets - 10 reps Staggered Stance Forward Backward Weight Shift with Unilateral Counter Support - 1 x daily - 7 x weekly - 2 sets - 10 reps Seated Heel Raise - 1 x daily - 7 x weekly - 2 sets - 10 reps Ankle Inversion Eversion Towel Slide - 1 x daily - 7 x weekly - 2 sets - 10 reps Long Sitting Calf Stretch with Strap - 1 x daily - 7 x weekly - 2-3 reps - 30 sec hold Ankle Inversion with Resistance - 1 x daily - 7 x weekly - 2 sets - 10 reps - yellow tband hold Ankle Eversion with Resistance - 1 x daily - 7 x weekly - 2 sets - 10 reps - yellow tband hold      ASSESSMENT:   CLINICAL IMPRESSION: Pt was able to complete all prescribed exercises with no adverse effect. Therapy today focused on improving distal R ankle strength and overall mobility. She continues to benefit from skilled PT services and will continue to be seen and progressed as tolerated.    REHAB POTENTIAL: Good   CLINICAL DECISION MAKING: Evolving/moderate complexity   EVALUATION COMPLEXITY: Moderate     GOALS: Goals reviewed with patient? Yes   SHORT TERM GOALS:   STG Name Target Date Goal status  1 Pt to be IND with initial HEP for therapeutic progression Baseline:  12/09/2021 INITIAL  2 Pt to be able to verbalize/ demo heel strike/ toe off without using a walking boot with </= 4/10 max pain Baseline:  12/09/2021 INITIAL    LONG TERM GOALS:    LTG Name Target Date Goal status  1 Increase ankle DF to >/=  10 degrees to promote efficeiint gait pattern  Baseline: 01/06/2022 INITIAL  2  Increase great toe extension AROM to >/= 20 degrees to assist with toe off phase of gate Baseline: 01/06/2022 INITIAL  3 Increase gross ankle strength to >/= 5/5 to promote stability with standing/ walking  Baseline: 01/06/2022 INITIAL  4 Pt to be able to walk/ stand for >/= 60 min with no report of pain or limtations for endurance required for work related tasks.  Baseline: 01/06/2022 INITIAL  5 Increase FOTO score to >/= 72% to demo improvement in function Baseline: 01/06/2022 INITIAL  6 Pt to be IND with all HEP and is able to maintain and progress current LOF IND 01/06/2022   INITIAL    PLAN: PT FREQUENCY: 1-2x/week   PT DURATION: 8 weeks   PLANNED INTERVENTIONS: Therapeutic exercises, Therapeutic activity, Neuro Muscular re-education, Balance training, Gait training, Patient/Family education, Joint mobilization, Stair training, Aquatic Therapy, Dry Needling, Cryotherapy, Moist heat, Taping, Vasopneumatic device, Ultrasound, Ionotophoresis 57m/ml Dexamethasone, and Manual therapy   PLAN FOR NEXT SESSION: Review/ update HEP PRN, practice gait training, general ankle strengthening, gentle great toe extension.    DWard Chatters PT 11/19/2021, 3:25 PM

## 2021-11-25 ENCOUNTER — Other Ambulatory Visit: Payer: Self-pay

## 2021-11-25 ENCOUNTER — Ambulatory Visit: Payer: BC Managed Care – PPO

## 2021-11-25 DIAGNOSIS — M25571 Pain in right ankle and joints of right foot: Secondary | ICD-10-CM

## 2021-11-25 DIAGNOSIS — R2689 Other abnormalities of gait and mobility: Secondary | ICD-10-CM

## 2021-11-25 DIAGNOSIS — M6281 Muscle weakness (generalized): Secondary | ICD-10-CM | POA: Diagnosis not present

## 2021-11-25 NOTE — Therapy (Signed)
OUTPATIENT PHYSICAL THERAPY TREATMENT NOTE   Patient Name: Stacie Carrillo MRN: [id number] DOB:1981/09/09, 41 y.o., female Today's Date: 11/25/2021  PCP: Delton Prairie, PA-C REFERRING PROVIDER: Delton Prairie, PA-C   PT End of Session - 11/25/21 1359     Visit Number 3    Number of Visits 17    Date for PT Re-Evaluation 01/06/22    PT Start Time 1400    PT Stop Time 1441    PT Time Calculation (min) 41 min    Activity Tolerance Patient tolerated treatment well    Behavior During Therapy WFL for tasks assessed/performed              Past Medical History:  Diagnosis Date   Anemia    Anxiety    Depression    Diabetes mellitus without complication ([place])    Obesity    Polycystic ovarian syndrome    Reflux    Ulcer    Past Surgical History:  Procedure Laterality Date   boil removal  2014   CESAREAN SECTION     x4   HALLUX VALGUS CORRECTION Right 09/19/2021   IRRIGATION AND DEBRIDEMENT ABSCESS Right 10/09/2013   Procedure: IRRIGATION AND DEBRIDEMENT RIGHT AXILLARY ABSCESS;  Surgeon: Shann Medal, MD;  Location: [facility];  Service: General;  Laterality: Right;   TUBAL LIGATION     UPPER GASTROINTESTINAL ENDOSCOPY     Patient Active Problem List   Diagnosis Date Noted   Migraine without aura and without status migrainosus, not intractable 01/28/2021   Ulcer, stomach peptic, chronic 01/28/2021   Abscess of axilla, right 10/10/2013   Hydradenitis 10/08/2013   Cellulitis of axilla, right 10/08/2013   Cellulitis 10/08/2013   Stricture and stenosis of esophagus 12/31/2011   Esophageal reflux 11/24/2011   Anemia 11/24/2011   POLYCYSTIC OVARY 12/16/2006   OBESITY, NOS 12/16/2006   TOBACCO DEPENDENCE 12/16/2006    REFERRING DIAG:  Acquired hallux valgus of right foot [M20.11], Metatarsal deformity, right [M21.961], Status post right foot surgery [Z98.890]  THERAPY DIAG:  Pain in right ankle and joints of right foot  Muscle weakness (generalized)  Other  abnormalities of gait and mobility  PERTINENT HISTORY:  s/p bunion repair and 2nd met osteotomy right foot, DOS 09/19/2021; Hx of anxiety/ depresison, anemia  PRECAUTIONS: None  SUBJECTIVE:  Pt presents to PT with no current reports of R foot pain. Has been compliant with HEP with no adverse effect per report. She is ready to begin PT at this time.   PAIN:  Are you having pain? No NPRS scale: 0/10 Pain location: R foot Pain orientation: Right  PAIN TYPE: aching and sore Aggravating factors: laying down at night, prolonged sitting, standing, walking Relieving factors: ibuprofen and elevation,      OBJECTIVE:    PATIENT SURVEYS:  FOTO 55%, precited 72%   LE AROM/PROM:   A/PROM Right 11/11/2021 Right 11/25/2021  Hip flexion      Hip extension      Hip abduction      Hip adduction      Hip internal rotation      Hip external rotation      Knee flexion      Knee extension      Ankle dorsiflexion 0/ 10 4   Ankle plantarflexion 50    Ankle inversion 20    Ankle eversion 14    Great toe extension 0/35     (Blank rows = not tested)   LE MMT:  MMT Right 11/11/2021 Left 11/11/2021  Hip flexion      Hip extension      Hip abduction      Hip adduction      Hip internal rotation      Hip external rotation      Knee flexion      Knee extension      Ankle dorsiflexion 4/5 5/5  Ankle plantarflexion 4+/5 5/5  Ankle inversion 4/5    Ankle eversion 4/5     (Blank rows = not tested)     FUNCTIONAL TESTS:  5 times sit to stand: 12 seconds   GAIT: Assistive device utilized:  walking shoe Level of assistance: Modified independence Comments: antalgice gait pattern with limited step length on LLE, and stance on RLE        TODAY'S TREATMENT:   [facility] Adult PT Treatment:                                                DATE: 11/25/2021 Therapeutic Exercise: NuStep lvl 5 UE/LE x 4 min while taking subjective Towel crunch 2x60" R seated heel raise 2x20 10lb KB on knee R calf  stretch with towel 2x45" R ankle DF/PF 2x15 RTB R ankle Inv/Ev YTB 2x10 ea  Neuromuscular Re-Ed: SLS on R 3x30" bouts Step up fwd/lat x 10 ea leading - (no UE support)  [facility] Adult PT Treatment:                                                DATE: 11/19/2021 Therapeutic Exercise: NuStep lvl 5 UE/LE x 4 min while taking subjective Towel crunch 2x45" R seated heel raise 2x15 R calf stretch with towel 2x30" Towel inv/ev x 5 R ankle DF/PF 2x15 YTB R ankle Inv/Ev YTB 2x10 ea     PATIENT EDUCATION:  Education details: HEP update Person educated: Patient Education method: Explanation Education comprehension: verbalized understanding     HOME EXERCISE PROGRAM: Access Code: [email or web address] URL: [email or web address] Date: 11/25/2021 Prepared by: Octavio Manns  Exercises Staggered Stance Forward Backward Weight Shift with Counter Support - 1 x daily - 7 x weekly - 2 sets - 10 reps Staggered Stance Forward Backward Weight Shift with Unilateral Counter Support - 1 x daily - 7 x weekly - 2 sets - 10 reps Seated Heel Raise - 1 x daily - 7 x weekly - 2 sets - 10 reps Ankle Inversion Eversion Towel Slide - 1 x daily - 7 x weekly - 2 sets - 10 reps Long Sitting Calf Stretch with Strap - 1 x daily - 7 x weekly - 2-3 reps - 30 sec hold Ankle Inversion with Resistance - 1 x daily - 7 x weekly - 2 sets - 10 reps - yellow tband hold Ankle Eversion with Resistance - 1 x daily - 7 x weekly - 2 sets - 10 reps - yellow tband hold Single Leg Stance - 1 x daily - 7 x weekly - 3 reps - 30 sec hold      ASSESSMENT:   CLINICAL IMPRESSION: Pt was able to complete all prescribed exercises with progression and no adverse effect. Therapy today focused on improving R LE strength and balance post foot surgery. She continues  to progress well with therapy, HEP updated for continued balance work at home. Will continue to progress as tolerated per POC.    GOALS: Goals reviewed with patient? Yes   SHORT  TERM GOALS:   STG Name Target Date Goal status  1 Pt to be IND with initial HEP for therapeutic progression Baseline:  12/09/2021 MET  2 Pt to be able to verbalize/ demo heel strike/ toe off without using a walking boot with </= 4/10 max pain Baseline:  12/09/2021 MET    LONG TERM GOALS:    LTG Name Target Date Goal status  1 Increase ankle DF to >/= 10 degrees to promote efficeiint gait pattern  Baseline: 01/06/2022 INITIAL  2 Increase great toe extension AROM to >/= 20 degrees to assist with toe off phase of gate Baseline: 01/06/2022 INITIAL  3 Increase gross ankle strength to >/= 5/5 to promote stability with standing/ walking  Baseline: 01/06/2022 INITIAL  4 Pt to be able to walk/ stand for >/= 60 min with no report of pain or limtations for endurance required for work related tasks.  Baseline: 01/06/2022 INITIAL  5 Increase FOTO score to >/= 72% to demo improvement in function Baseline: 01/06/2022 INITIAL  6 Pt to be IND with all HEP and is able to maintain and progress current LOF IND 01/06/2022   INITIAL    PLAN: PT FREQUENCY: 1-2x/week   PT DURATION: 8 weeks   PLANNED INTERVENTIONS: Therapeutic exercises, Therapeutic activity, Neuro Muscular re-education, Balance training, Gait training, Patient/Family education, Joint mobilization, Stair training, Aquatic Therapy, Dry Needling, Cryotherapy, Moist heat, Taping, Vasopneumatic device, Ultrasound, Ionotophoresis 59m/ml Dexamethasone, and Manual therapy   PLAN FOR NEXT SESSION: Review/ update HEP PRN, practice gait training, general ankle strengthening, gentle great toe extension.    DWard Chatters PT 11/25/2021, 2:43 PM

## 2021-11-27 ENCOUNTER — Other Ambulatory Visit: Payer: Self-pay

## 2021-11-27 ENCOUNTER — Ambulatory Visit: Payer: BC Managed Care – PPO

## 2021-11-27 DIAGNOSIS — M6281 Muscle weakness (generalized): Secondary | ICD-10-CM | POA: Diagnosis not present

## 2021-11-27 DIAGNOSIS — M25571 Pain in right ankle and joints of right foot: Secondary | ICD-10-CM | POA: Diagnosis not present

## 2021-11-27 DIAGNOSIS — R2689 Other abnormalities of gait and mobility: Secondary | ICD-10-CM | POA: Diagnosis not present

## 2021-11-27 NOTE — Therapy (Signed)
OUTPATIENT PHYSICAL THERAPY TREATMENT NOTE   Patient Name: Stacie Carrillo MRN: [id number] DOB:1981/06/27, 41 y.o., female Today's Date: 11/27/2021  PCP: Delton Prairie, PA-C REFERRING PROVIDER: Garrel Ridgel, DPM   PT End of Session - 11/27/21 1404     Visit Number 4    Number of Visits 17    Date for PT Re-Evaluation 01/06/22    PT Start Time 3151    PT Stop Time 1440    PT Time Calculation (min) 38 min    Activity Tolerance Patient tolerated treatment well    Behavior During Therapy WFL for tasks assessed/performed               Past Medical History:  Diagnosis Date   Anemia    Anxiety    Depression    Diabetes mellitus without complication ([place])    Obesity    Polycystic ovarian syndrome    Reflux    Ulcer    Past Surgical History:  Procedure Laterality Date   boil removal  2014   CESAREAN SECTION     x4   HALLUX VALGUS CORRECTION Right 09/19/2021   IRRIGATION AND DEBRIDEMENT ABSCESS Right 10/09/2013   Procedure: IRRIGATION AND DEBRIDEMENT RIGHT AXILLARY ABSCESS;  Surgeon: Shann Medal, MD;  Location: [facility];  Service: General;  Laterality: Right;   TUBAL LIGATION     UPPER GASTROINTESTINAL ENDOSCOPY     Patient Active Problem List   Diagnosis Date Noted   Migraine without aura and without status migrainosus, not intractable 01/28/2021   Ulcer, stomach peptic, chronic 01/28/2021   Abscess of axilla, right 10/10/2013   Hydradenitis 10/08/2013   Cellulitis of axilla, right 10/08/2013   Cellulitis 10/08/2013   Stricture and stenosis of esophagus 12/31/2011   Esophageal reflux 11/24/2011   Anemia 11/24/2011   POLYCYSTIC OVARY 12/16/2006   OBESITY, NOS 12/16/2006   TOBACCO DEPENDENCE 12/16/2006    REFERRING DIAG:  Acquired hallux valgus of right foot [M20.11], Metatarsal deformity, right [M21.961], Status post right foot surgery [Z98.890]  THERAPY DIAG:  Pain in right ankle and joints of right foot  Muscle weakness (generalized)  Other  abnormalities of gait and mobility  PERTINENT HISTORY:  s/p bunion repair and 2nd met osteotomy right foot, DOS 09/19/2021; Hx of anxiety/ depresison, anemia  PRECAUTIONS: None  SUBJECTIVE:  Pt presents to PT with reports of increased R foot pain. Has been compliant with HEP with no adverse effect per report. She is ready to begin PT at this time.   PAIN:  Are you having pain? Yes NPRS scale: 9/10 Pain location: R foot Pain orientation: Right  PAIN TYPE: aching and sore Aggravating factors: laying down at night, prolonged sitting, standing, walking Relieving factors: ibuprofen and elevation,      OBJECTIVE:    PATIENT SURVEYS:  FOTO 55%, precited 72%   LE AROM/PROM:   A/PROM Right 11/11/2021 Right 11/25/2021  Hip flexion      Hip extension      Hip abduction      Hip adduction      Hip internal rotation      Hip external rotation      Knee flexion      Knee extension      Ankle dorsiflexion 0/ 10 4   Ankle plantarflexion 50    Ankle inversion 20    Ankle eversion 14    Great toe extension 0/35     (Blank rows = not tested)   LE MMT:  MMT Right 11/11/2021 Left 11/11/2021  Hip flexion      Hip extension      Hip abduction      Hip adduction      Hip internal rotation      Hip external rotation      Knee flexion      Knee extension      Ankle dorsiflexion 4/5 5/5  Ankle plantarflexion 4+/5 5/5  Ankle inversion 4/5    Ankle eversion 4/5     (Blank rows = not tested)     FUNCTIONAL TESTS:  5 times sit to stand: 12 seconds   GAIT: Assistive device utilized:  walking shoe Level of assistance: Modified independence Comments: antalgic gait pattern with limited step length on LLE, and stance on RLE        TODAY'S TREATMENT:   [facility] Adult PT Treatment:                                                DATE: 11/27/2021 Therapeutic Exercise: NuStep lvl 5 UE/LE x 5 min while taking subjective Towel crunch 2x60" Towel inv/ev x 5 R seated heel raise 2x20 10lb  KB on knee R calf stretch with towel 2x45" R ankle DF/PF/inv/ev 2x15 RTB   [facility] Adult PT Treatment:                                                DATE: 11/25/2021 Therapeutic Exercise: NuStep lvl 5 UE/LE x 4 min while taking subjective Towel crunch 2x60" R seated heel raise 2x20 10lb KB on knee R calf stretch with towel 2x45" R ankle DF/PF 2x15 RTB R ankle Inv/Ev YTB 2x10 ea  Neuromuscular Re-Ed: SLS on R 3x30" bouts Step up fwd/lat x 10 ea leading - (no UE support)  [facility] Adult PT Treatment:                                                DATE: 11/19/2021 Therapeutic Exercise: NuStep lvl 5 UE/LE x 4 min while taking subjective Towel crunch 2x45" R seated heel raise 2x15 R calf stretch with towel 2x30" Towel inv/ev x 5 R ankle DF/PF 2x15 YTB R ankle Inv/Ev YTB 2x10 ea     PATIENT EDUCATION:  Education details: HEP update Person educated: Patient Education method: Explanation Education comprehension: verbalized understanding     HOME EXERCISE PROGRAM: Access Code: [email or web address] URL: [email or web address] Date: 11/25/2021 Prepared by: Octavio Manns  Exercises Staggered Stance Forward Backward Weight Shift with Counter Support - 1 x daily - 7 x weekly - 2 sets - 10 reps Staggered Stance Forward Backward Weight Shift with Unilateral Counter Support - 1 x daily - 7 x weekly - 2 sets - 10 reps Seated Heel Raise - 1 x daily - 7 x weekly - 2 sets - 10 reps Ankle Inversion Eversion Towel Slide - 1 x daily - 7 x weekly - 2 sets - 10 reps Long Sitting Calf Stretch with Strap - 1 x daily - 7 x weekly - 2-3 reps - 30 sec hold Ankle Inversion with Resistance - 1 x  daily - 7 x weekly - 2 sets - 10 reps - yellow tband hold Ankle Eversion with Resistance - 1 x daily - 7 x weekly - 2 sets - 10 reps - yellow tband hold Single Leg Stance - 1 x daily - 7 x weekly - 3 reps - 30 sec hold      ASSESSMENT:   CLINICAL IMPRESSION: Pt was able to complete all prescribed exercises, but  noted increased pain today. Therapy focused on improving foot intrinsic and ankle strength for improving functional ability. She continues to benefit from skilled PT services and will continue to be seen per POC as prescribed.    GOALS: Goals reviewed with patient? Yes   SHORT TERM GOALS:   STG Name Target Date Goal status  1 Pt to be IND with initial HEP for therapeutic progression Baseline:  12/09/2021 MET  2 Pt to be able to verbalize/ demo heel strike/ toe off without using a walking boot with </= 4/10 max pain Baseline:  12/09/2021 MET    LONG TERM GOALS:    LTG Name Target Date Goal status  1 Increase ankle DF to >/= 10 degrees to promote efficeiint gait pattern  Baseline: 01/06/2022 INITIAL  2 Increase great toe extension AROM to >/= 20 degrees to assist with toe off phase of gate Baseline: 01/06/2022 INITIAL  3 Increase gross ankle strength to >/= 5/5 to promote stability with standing/ walking  Baseline: 01/06/2022 INITIAL  4 Pt to be able to walk/ stand for >/= 60 min with no report of pain or limtations for endurance required for work related tasks.  Baseline: 01/06/2022 INITIAL  5 Increase FOTO score to >/= 72% to demo improvement in function Baseline: 01/06/2022 INITIAL  6 Pt to be IND with all HEP and is able to maintain and progress current LOF IND 01/06/2022   INITIAL    PLAN: PT FREQUENCY: 1-2x/week   PT DURATION: 8 weeks   PLANNED INTERVENTIONS: Therapeutic exercises, Therapeutic activity, Neuro Muscular re-education, Balance training, Gait training, Patient/Family education, Joint mobilization, Stair training, Aquatic Therapy, Dry Needling, Cryotherapy, Moist heat, Taping, Vasopneumatic device, Ultrasound, Ionotophoresis 75m/ml Dexamethasone, and Manual therapy   PLAN FOR NEXT SESSION: Review/ update HEP PRN, practice gait training, general ankle strengthening, gentle great toe extension.    DWard Chatters PT 11/27/2021, 2:43 PM

## 2021-12-04 ENCOUNTER — Other Ambulatory Visit: Payer: Self-pay

## 2021-12-04 ENCOUNTER — Ambulatory Visit (INDEPENDENT_AMBULATORY_CARE_PROVIDER_SITE_OTHER): Payer: BC Managed Care – PPO

## 2021-12-04 ENCOUNTER — Encounter: Payer: Self-pay | Admitting: Podiatry

## 2021-12-04 ENCOUNTER — Ambulatory Visit: Payer: BC Managed Care – PPO | Admitting: Physical Therapy

## 2021-12-04 ENCOUNTER — Ambulatory Visit (INDEPENDENT_AMBULATORY_CARE_PROVIDER_SITE_OTHER): Payer: BC Managed Care – PPO | Admitting: Podiatry

## 2021-12-04 DIAGNOSIS — M21961 Unspecified acquired deformity of right lower leg: Secondary | ICD-10-CM | POA: Diagnosis not present

## 2021-12-04 DIAGNOSIS — M2011 Hallux valgus (acquired), right foot: Secondary | ICD-10-CM | POA: Diagnosis not present

## 2021-12-04 DIAGNOSIS — L600 Ingrowing nail: Secondary | ICD-10-CM

## 2021-12-04 DIAGNOSIS — Z9889 Other specified postprocedural states: Secondary | ICD-10-CM

## 2021-12-06 NOTE — Progress Notes (Signed)
She presents today states that she hit her toe just the other day on the door and it bothers the top of the foot.  She says this time hurt and sort of ever since doing that as she points to the proximalmost area of her incision on her right foot.  She denies fever chills nausea run muscle aches and pains however states that otherwise the toe was doing good until that point.  Date of surgery is 09/19/2021.  Objective: Vital signs stable alert oriented x3 there is mild edema there is no broken skin no erythema cellulitis drainage or odor she got great range of motion of the first metatarsophalangeal joint.  Radiographs appear to demonstrate that the internal fixation is in good position.  Assessment: Well-healed surgical foot.  Plan: Follow-up with her in 1 month get her back into regular shoe gear better get her back to her regular activities.

## 2022-01-01 ENCOUNTER — Encounter: Payer: BC Managed Care – PPO | Admitting: Podiatry

## 2022-01-05 ENCOUNTER — Encounter: Payer: BC Managed Care – PPO | Admitting: Podiatry

## 2022-01-15 ENCOUNTER — Ambulatory Visit (INDEPENDENT_AMBULATORY_CARE_PROVIDER_SITE_OTHER): Payer: BC Managed Care – PPO | Admitting: Podiatry

## 2022-01-15 ENCOUNTER — Encounter: Payer: Self-pay | Admitting: Podiatry

## 2022-01-15 ENCOUNTER — Ambulatory Visit (INDEPENDENT_AMBULATORY_CARE_PROVIDER_SITE_OTHER): Payer: BC Managed Care – PPO

## 2022-01-15 DIAGNOSIS — M2011 Hallux valgus (acquired), right foot: Secondary | ICD-10-CM | POA: Diagnosis not present

## 2022-01-15 DIAGNOSIS — M21961 Unspecified acquired deformity of right lower leg: Secondary | ICD-10-CM

## 2022-01-15 DIAGNOSIS — L600 Ingrowing nail: Secondary | ICD-10-CM | POA: Diagnosis not present

## 2022-01-15 DIAGNOSIS — Z9889 Other specified postprocedural states: Secondary | ICD-10-CM

## 2022-01-15 NOTE — Progress Notes (Signed)
She presents today date of surgery 09/19/2021 [facility] bunion repair second metatarsal osteotomy surgical partial matrixectomy.  States that there was so much pain on the top of the foot the other day after he was sleep I will so scared that something is wrong with his foot.  She tried the gabapentin and even her narcotic and nothing made it feel better.  She denies calf pain back pain chest pain shortness of breath.  States that she was not sure that she may have hit it at work and even hit it with a drawer from her desk.  She is not sure if that actually happened but states that it was a possibility. ? ?Objective: Vital signs stable alert oriented x3 pulses are palpable.  The right foot looks very good much decrease in edema no erythema cellulitis drainage or odor.  She has a small nodular area just at the distal aspect of the medialmost incision consistent with a knot from the sutures.  This is on currently nontender today but she has good range of motion of that first metatarsophalangeal joint and states that she continues her physical therapy.  Radiographs confirm well-healing first met and second met osteotomies with good internal fixation. ? ?Assessment: Well-healing surgical foot. ? ?Plan: We will follow-up with her in 1 month to make sure she is doing well ?

## 2022-08-30 ENCOUNTER — Encounter (HOSPITAL_COMMUNITY): Payer: Self-pay

## 2022-08-30 ENCOUNTER — Other Ambulatory Visit: Payer: Self-pay

## 2022-08-30 ENCOUNTER — Emergency Department (HOSPITAL_COMMUNITY)
Admission: EM | Admit: 2022-08-30 | Discharge: 2022-08-30 | Disposition: A | Discharge status: Home / Self Care | Payer: BC Managed Care – PPO | Attending: Student | Admitting: Student

## 2022-08-30 DIAGNOSIS — Z91040 Latex allergy status: Secondary | ICD-10-CM | POA: Insufficient documentation

## 2022-08-30 DIAGNOSIS — K0889 Other specified disorders of teeth and supporting structures: Secondary | ICD-10-CM | POA: Insufficient documentation

## 2022-08-30 MED ORDER — AMOXICILLIN 500 MG PO CAPS: 500.0000 mg | ORAL_CAPSULE | Freq: Three times a day (TID) | ORAL | 0 refills | Status: AC

## 2022-08-30 MED ORDER — BUPIVACAINE-EPINEPHRINE (PF) 0.5% -1:200000 IJ SOLN
1.8000 mL | Freq: Once | INTRAMUSCULAR | Status: DC
  Filled 2022-08-30: qty 1.8

## 2022-08-30 MED ORDER — OXYCODONE-ACETAMINOPHEN 5-325 MG PO TABS
1.0000 | ORAL_TABLET | Freq: Once | ORAL | Status: AC
  Administered 2022-08-30: 1 via ORAL
  Filled 2022-08-30: qty 1

## 2022-08-30 NOTE — ED Triage Notes (Signed)
Complaining of a cracked tooth on the rt upper side of mouth. Is throbbing and will not be relieved with anything she tries at home.  A&O x 4

## 2022-08-30 NOTE — ED Provider Notes (Signed)
[facility]-EMERGENCY DEPT Provider Note   CSN: [id number] Arrival date & time: 08/30/22  9622     History  Chief Complaint  Patient presents with   Dental Pain    Stacie Carrillo is a 41 y.o. female.  41 year old female presents to the emergency department for right upper dental pain.  She was eating today when she further cracked her tooth.  Pain was immediate and has remained constant.  It is unrelieved with ibuprofen and Tylenol.  She tried contacting a 24-hour dentist, but has not heard back.  Denies any fevers, difficulty with jaw opening, problems swallowing, oral bleeding.  The history is provided by the patient. No language interpreter was used.  Dental Pain      Home Medications Prior to Admission medications   Medication Sig Start Date End Date Taking? Authorizing Provider  amoxicillin (AMOXIL) 500 MG capsule Take 1 capsule (500 mg total) by mouth 3 (three) times daily. 08/30/22  Yes Antony Madura, PA-C  cycloSPORINE (RESTASIS) 0.05 % ophthalmic emulsion 1 drop 2 (two) times a day.    [provider]  Diclofenac Sodium (PENNSAID) 2 % SOLN Apply 2 g topically 2 (two) times daily as needed (to affected area). 04/04/21   Tarry Kos, MD  fluticasone (FLONASE) 50 MCG/ACT nasal spray Place into the nose. 01/28/21 01/28/22  [provider]  loratadine (CLARITIN) 10 MG tablet Take by mouth. 06/18/21 06/18/22  [provider]  metoCLOPramide (REGLAN) 10 MG tablet Take 1 tablet (10 mg total) by mouth 2 (two) times daily as needed (Migraine headaches). 01/23/21   Placido Sou, PA-C  amoxicillin-clarithromycin-lansoprazole [facility]) combo pack Take by mouth 2 (two) times daily. Follow package directions. 12/11/11 12/31/11  Louis Meckel, MD      Allergies    Nsaids and Latex    Review of Systems   Review of Systems Ten systems reviewed and are negative for acute change, except as noted in the HPI.    Physical Exam Updated  Vital Signs BP 130/88 (BP Location: Right Arm)   Pulse 92   Temp 98.3 F (36.8 C) (Oral)   Resp 18   Ht 5\' 5"  (1.651 m)   Wt 89.8 kg   LMP 08/02/2022   SpO2 99%   BMI 32.95 kg/m   Physical Exam Vitals and nursing note reviewed.  Constitutional:      General: She is not in acute distress.    Appearance: She is well-developed. She is not diaphoretic.     Comments: Nontoxic appearing, though seems uncomfortable.  HENT:     Head: Normocephalic and atraumatic.     Mouth/Throat:      Comments: No trismus or malocclusion. Soft oral floor. Normal phonation. Tongue midline. Eyes:     General: No scleral icterus.    Conjunctiva/sclera: Conjunctivae normal.  Pulmonary:     Effort: Pulmonary effort is normal. No respiratory distress.     Comments: Respirations even and unlabored Musculoskeletal:        General: Normal range of motion.     Cervical back: Normal range of motion.  Skin:    General: Skin is warm and dry.     Coloration: Skin is not pale.     Findings: No erythema or rash.  Neurological:     Mental Status: She is alert and oriented to person, place, and time.  Psychiatric:        Behavior: Behavior normal.     ED Results / Procedures / Treatments  Labs (all labs ordered are listed, but only abnormal results are displayed) Labs Reviewed - No data to display  EKG None  Radiology No results found.  Procedures Dental Block  Date/Time: 08/30/2022 7:12 AM  Performed by: Antony Madura, PA-C Authorized by: Antony Madura, PA-C   Consent:    Consent obtained:  Verbal   Consent given by:  Patient   Risks, benefits, and alternatives were discussed: yes     Risks discussed:  Infection, pain, swelling and unsuccessful block   Alternatives discussed:  No treatment and alternative treatment Universal protocol:    Imaging studies available: yes     Required blood products, implants, devices, and special equipment available: yes     Site/side marked: yes      Patient identity confirmed:  Verbally with patient and arm band Indications:    Indications: dental pain   Location:    Block type:  Supraperiosteal   Supraperiosteal location:  Upper teeth   Upper teeth location:  4/RU 2nd bicuspid Procedure details:    Syringe type:  Aspirating dental syringe   Needle gauge:  25 G   Injection procedure:  Anatomic landmarks identified, introduced needle, incremental injection, anatomic landmarks palpated and negative aspiration for blood Post-procedure details:    Outcome:  Pain relieved   Procedure completion:  Tolerated well, no immediate complications     Medications Ordered in ED Medications  bupivacaine-epinephrine (PF) (MARCAINE W/ EPI) 0.5% -1:200000 injection 1.8 mL (has no administration in time range)  oxyCODONE-acetaminophen (PERCOCET/ROXICET) 5-325 MG per tablet 1 tablet (1 tablet Oral Given 08/30/22 0617)    ED Course/ Medical Decision Making/ A&P                           Medical Decision Making Risk Prescription drug management.   This patient presents to the ED for concern of dental pain, this involves an extensive number of treatment options, and is a complaint that carries with it a high risk of complications and morbidity.  The differential diagnosis includes dental infection vs abscess vs dental fracture vs ludwig's angina   Co morbidities that complicate the patient evaluation  Depression PCOS DM   Additional history obtained:  Additional history obtained from spouse   Cardiac Monitoring:  The patient was maintained on a cardiac monitor.  I personally viewed and interpreted the cardiac monitored which showed an underlying rhythm of: NSR   Medicines ordered and prescription drug management:  I ordered medication including Percocet and bupivocaine for pain control  Reevaluation of the patient after these medicines showed that the patient resolved I have reviewed the patients home medicines and have made  adjustments as needed   Problem List / ED Course:  Patient with toothache.  No gross abscess.  Exam unconcerning for Ludwig's angina or spread of infection.  Will treat give Rx for Amoxicillin.    Reevaluation:  After the interventions noted above, I reevaluated the patient and found that they have :improved   Social Determinants of Health:  Lack of dental care   Dispostion:  After consideration of the diagnostic results and the patients response to treatment, I feel that the patent would benefit from outpatient dental follow up. Referral given. Return precautions discussed and provided. Patient discharged in stable condition with no unaddressed concerns.          Final Clinical Impression(s) / ED Diagnoses Final diagnoses:  Pain, dental    Rx / DC Orders ED Discharge  Orders          Ordered    amoxicillin (AMOXIL) 500 MG capsule  3 times daily        08/30/22 0628              Antony Madura, PA-C 08/30/22 0726    Glendora Score, MD 08/30/22 2020

## 2022-08-30 NOTE — Discharge Instructions (Signed)
Follow-up with a dentist.  Only a dentist can fix your problem.  You have been given amoxicillin to take if your pain persists and you begin to experience gingival and/or facial swelling.  Return to the ED for any new or concerning symptoms.

## 2022-09-19 DIAGNOSIS — Z6833 Body mass index (BMI) 33.0-33.9, adult: Secondary | ICD-10-CM | POA: Diagnosis not present

## 2022-09-19 DIAGNOSIS — M25512 Pain in left shoulder: Secondary | ICD-10-CM | POA: Diagnosis not present

## 2022-09-19 DIAGNOSIS — R202 Paresthesia of skin: Secondary | ICD-10-CM | POA: Diagnosis not present
